# Patient Record
Sex: Female | Born: 1945
Health system: Southern US, Community
[De-identification: ages and names within clinical notes are randomized; demographics above are authoritative.]

## PROBLEM LIST (undated history)

## (undated) DIAGNOSIS — D649 Anemia, unspecified: Secondary | ICD-10-CM

## (undated) DIAGNOSIS — C4492 Squamous cell carcinoma of skin, unspecified: Secondary | ICD-10-CM

## (undated) DIAGNOSIS — M810 Age-related osteoporosis without current pathological fracture: Secondary | ICD-10-CM

## (undated) DIAGNOSIS — Z8489 Family history of other specified conditions: Secondary | ICD-10-CM

## (undated) DIAGNOSIS — R112 Nausea with vomiting, unspecified: Secondary | ICD-10-CM

## (undated) DIAGNOSIS — E119 Type 2 diabetes mellitus without complications: Secondary | ICD-10-CM

## (undated) DIAGNOSIS — E785 Hyperlipidemia, unspecified: Secondary | ICD-10-CM

## (undated) DIAGNOSIS — G459 Transient cerebral ischemic attack, unspecified: Secondary | ICD-10-CM

## (undated) DIAGNOSIS — M1990 Unspecified osteoarthritis, unspecified site: Secondary | ICD-10-CM

## (undated) DIAGNOSIS — G9050 Complex regional pain syndrome I, unspecified: Secondary | ICD-10-CM

## (undated) DIAGNOSIS — C801 Malignant (primary) neoplasm, unspecified: Secondary | ICD-10-CM

## (undated) DIAGNOSIS — Z85828 Personal history of other malignant neoplasm of skin: Secondary | ICD-10-CM

## (undated) DIAGNOSIS — I10 Essential (primary) hypertension: Secondary | ICD-10-CM

## (undated) DIAGNOSIS — Z87442 Personal history of urinary calculi: Secondary | ICD-10-CM

## (undated) DIAGNOSIS — Z9889 Other specified postprocedural states: Secondary | ICD-10-CM

## (undated) HISTORY — PX: MOHS SURGERY: SHX181

## (undated) HISTORY — PX: DILATION AND CURETTAGE OF UTERUS: SHX78

## (undated) HISTORY — PX: SHOULDER ARTHROSCOPY: SHX128

## (undated) HISTORY — PX: KNEE ARTHROSCOPY: SHX127

## (undated) HISTORY — DX: Squamous cell carcinoma of skin, unspecified: C44.92

## (undated) HISTORY — PX: ROTATOR CUFF REPAIR: SHX139

---

## 1992-10-02 DIAGNOSIS — G459 Transient cerebral ischemic attack, unspecified: Secondary | ICD-10-CM

## 1992-10-02 HISTORY — DX: Transient cerebral ischemic attack, unspecified: G45.9

## 1993-10-02 HISTORY — PX: ABDOMINAL HYSTERECTOMY: SHX81

## 1999-05-05 ENCOUNTER — Encounter: Payer: Self-pay | Admitting: Orthopedic Surgery

## 1999-05-09 ENCOUNTER — Ambulatory Visit (HOSPITAL_COMMUNITY): Admission: RE | Admit: 1999-05-09 | Discharge: 1999-05-10 | Payer: Self-pay | Admitting: Orthopedic Surgery

## 1999-07-14 ENCOUNTER — Encounter: Admission: RE | Admit: 1999-07-14 | Discharge: 1999-07-14 | Payer: Self-pay | Admitting: Obstetrics and Gynecology

## 1999-07-20 ENCOUNTER — Encounter: Payer: Self-pay | Admitting: Obstetrics and Gynecology

## 1999-07-20 ENCOUNTER — Encounter: Admission: RE | Admit: 1999-07-20 | Discharge: 1999-07-20 | Payer: Self-pay | Admitting: Obstetrics and Gynecology

## 2000-07-20 ENCOUNTER — Encounter: Payer: Self-pay | Admitting: Obstetrics and Gynecology

## 2000-07-20 ENCOUNTER — Encounter: Admission: RE | Admit: 2000-07-20 | Discharge: 2000-07-20 | Payer: Self-pay | Admitting: Obstetrics and Gynecology

## 2000-07-23 ENCOUNTER — Ambulatory Visit (HOSPITAL_COMMUNITY): Admission: RE | Admit: 2000-07-23 | Discharge: 2000-07-23 | Payer: Self-pay | Admitting: Orthopedic Surgery

## 2001-08-01 ENCOUNTER — Encounter: Admission: RE | Admit: 2001-08-01 | Discharge: 2001-08-01 | Payer: Self-pay | Admitting: Obstetrics and Gynecology

## 2001-08-01 ENCOUNTER — Encounter: Payer: Self-pay | Admitting: Obstetrics and Gynecology

## 2002-05-21 ENCOUNTER — Encounter (INDEPENDENT_AMBULATORY_CARE_PROVIDER_SITE_OTHER): Payer: Self-pay | Admitting: *Deleted

## 2002-05-21 ENCOUNTER — Ambulatory Visit (HOSPITAL_COMMUNITY): Admission: RE | Admit: 2002-05-21 | Discharge: 2002-05-21 | Payer: Self-pay | Admitting: Gastroenterology

## 2002-08-14 ENCOUNTER — Encounter: Admission: RE | Admit: 2002-08-14 | Discharge: 2002-08-14 | Payer: Self-pay | Admitting: Obstetrics and Gynecology

## 2002-08-14 ENCOUNTER — Encounter: Payer: Self-pay | Admitting: Obstetrics and Gynecology

## 2003-10-01 ENCOUNTER — Encounter: Admission: RE | Admit: 2003-10-01 | Discharge: 2003-10-01 | Payer: Self-pay | Admitting: Obstetrics and Gynecology

## 2004-03-11 ENCOUNTER — Ambulatory Visit (HOSPITAL_BASED_OUTPATIENT_CLINIC_OR_DEPARTMENT_OTHER): Admission: RE | Admit: 2004-03-11 | Discharge: 2004-03-11 | Payer: Self-pay | Admitting: Orthopedic Surgery

## 2004-03-11 ENCOUNTER — Ambulatory Visit (HOSPITAL_COMMUNITY): Admission: RE | Admit: 2004-03-11 | Discharge: 2004-03-11 | Payer: Self-pay | Admitting: Orthopedic Surgery

## 2005-03-26 ENCOUNTER — Emergency Department (HOSPITAL_COMMUNITY): Admission: EM | Admit: 2005-03-26 | Discharge: 2005-03-26 | Payer: Self-pay | Admitting: Emergency Medicine

## 2005-04-21 ENCOUNTER — Ambulatory Visit (HOSPITAL_BASED_OUTPATIENT_CLINIC_OR_DEPARTMENT_OTHER): Admission: RE | Admit: 2005-04-21 | Discharge: 2005-04-21 | Payer: Self-pay | Admitting: Orthopedic Surgery

## 2005-04-21 ENCOUNTER — Ambulatory Visit (HOSPITAL_COMMUNITY): Admission: RE | Admit: 2005-04-21 | Discharge: 2005-04-21 | Payer: Self-pay | Admitting: Orthopedic Surgery

## 2005-08-30 ENCOUNTER — Ambulatory Visit: Payer: Self-pay | Admitting: Pain Medicine

## 2005-09-07 ENCOUNTER — Ambulatory Visit: Payer: Self-pay | Admitting: Pain Medicine

## 2005-09-20 ENCOUNTER — Ambulatory Visit: Payer: Self-pay | Admitting: Physician Assistant

## 2007-03-22 ENCOUNTER — Encounter: Admission: RE | Admit: 2007-03-22 | Discharge: 2007-03-22 | Payer: Self-pay | Admitting: Family Medicine

## 2007-11-01 ENCOUNTER — Encounter: Admission: RE | Admit: 2007-11-01 | Discharge: 2007-11-01 | Payer: Self-pay | Admitting: Family Medicine

## 2007-11-17 ENCOUNTER — Emergency Department (HOSPITAL_COMMUNITY): Admission: EM | Admit: 2007-11-17 | Discharge: 2007-11-18 | Payer: Self-pay | Admitting: Emergency Medicine

## 2008-07-15 ENCOUNTER — Encounter: Admission: RE | Admit: 2008-07-15 | Discharge: 2008-07-15 | Payer: Self-pay | Admitting: Family Medicine

## 2009-05-29 ENCOUNTER — Emergency Department (HOSPITAL_COMMUNITY): Admission: EM | Admit: 2009-05-29 | Discharge: 2009-05-29 | Payer: Self-pay | Admitting: Family Medicine

## 2009-09-23 ENCOUNTER — Encounter: Admission: RE | Admit: 2009-09-23 | Discharge: 2009-09-23 | Payer: Self-pay | Admitting: Obstetrics and Gynecology

## 2010-10-12 ENCOUNTER — Encounter
Admission: RE | Admit: 2010-10-12 | Discharge: 2010-10-12 | Payer: Self-pay | Source: Home / Self Care | Attending: Family Medicine | Admitting: Family Medicine

## 2011-02-17 NOTE — Op Note (Signed)
Alexis Hopkins, Alexis Hopkins             ACCOUNT NO.:  [id number]   MEDICAL RECORD NO.:  [id number]          PATIENT TYPE:  AMB   LOCATION:  [facility]                         FACILITY:  [facility]   PHYSICIAN:  Marlowe Kays, M.D.  DATE OF BIRTH:  21-Apr-1946   DATE OF PROCEDURE:  04/21/2005  DATE OF DISCHARGE:                                 OPERATIVE REPORT   PREOPERATIVE DIAGNOSES:  1.  Torn lateral meniscus.  2.  Osteoarthritis left knee.   POSTOPERATIVE DIAGNOSES:  1.  Torn medial and lateral menisci.  2.  Osteoarthritis left knee.   OPERATION:  Left knee arthroscopy with 1) partial medial and lateral  meniscectomies, 2) shaving medial and lateral femoral condyle.   SURGEON:  Marlowe Kays, M.D.   ASSISTANT:  Nurse.   ANESTHESIA:  General.   PATHOLOGY AND JUSTIFICATION FOR PROCEDURE:  She has a history of having had  a left knee arthroscopy by me roughly a year ago. Her knee gave way on her  around June 27 of this year and she was seen by Dr. Darrelyn Hillock who treated her  for a Colles fracture in the left wrist but also sent her for an MRI of her  left knee because of the injury. The MRI was performed on July 8 and  in  addition to osteoarthritis did show a badly torn lateral meniscus and  consequently she is here today for the above mentioned surgery. See  operative description below for additional details.   DESCRIPTION OF PROCEDURE:  Satisfied general anesthesia, pneumatic  tourniquet, leg was Esmarched out nonsterilely, thigh stabilizer applied and  the leg was prepped from stabilizer to toes with DuraPrep, draped in sterile  field. In the right lower extremity, I used an Ace wrap and knee support.  Superior medial saline inflow, first through an anterolateral portal, the  medial compartment of the knee joint was evaluated . Immediately apparent  was a grade 1/2 wear of the medial femoral condyle and some trauma to the  anterior medial meniscus and synovitis. On probing, she had a  bucket-handle  type tear superficially of the anterior third of the medial meniscus. I used  a 4.2 shaver to smooth down the medial femoral condyle and the anterior  medial meniscus until the bucket handle component had been resected. The  remainder of the medial meniscus was normal on probing. Looking up in the  medial gutter and suprapateller area, no arthroscopically treatable areas  were noted. I then reversed portals. She had striking full-thickness  chondromalacia over most of the lateral tibial plateau. There was a remnant  of the lateral meniscus which was basically the anterior third but at its  junction where the middle third used to be there was a disruption there.  Posteriorly she had a severe bucket-handle type tear of the lateral  meniscus. I used arthroscopic scissors to detach the bucket-handle portion  at the posterior lateral curve and then used an accessory portal medially  grasping the cut portion of meniscus through the original anterior and  medial portal and using arthroscopic scissors to cut the intercondylar  attachment and then I  removed the large fragment as a unit enlarging the  medial portal slightly. This measured almost 3 cm and length and picture was  taken with a ruler. I then trimmed down what little was remaining of the  lateral meniscus posteriorly and anteriorly and also smoothed down the  lateral femoral condyle which had almost grade 4 chondromalacia as well. The  knee joint was irrigated until clear and all fluid possible removed. The two  anterior portals closed with 4-0 nylon. 20 mL 0.5% Marcaine with adrenalin,  4 mg of morphine were then introduced through the inflow apparatus which was  removed and this portal closed with 4-0 nylon as well. Betadine Adaptic dry  sterile dressings were applied. Tourniquet was released. She tolerated the  procedure well and was taken to the recovery room in satisfactory condition  with no known  complications.       JA/MEDQ  D:  04/21/2005  T:  04/21/2005  Job:  [id number]

## 2011-02-17 NOTE — Op Note (Signed)
[facility]  Patient:    Alexis Hopkins, Alexis Hopkins                      MRN: [id number] Proc. Date: 07/23/00 Adm. Date:  [id number] Attending:  Marlowe Kays Page                           Operative Report  PREOPERATIVE DIAGNOSIS:  Partial articular surface rotator cuff tear with degenerative changes of labrum, right shoulder status post rotator cuff repair.  POSTOPERATIVE DIAGNOSIS:  Partial articular surface rotator cuff tear with degenerative changes of labrum, right shoulder status post rotator cuff repair.  OPERATION:  Right shoulder arthroscopy with shaving of underneath surface of rotator cuff, debridement of adhesions from around biceps tendon and debridement of labrum and synovitis in shoulder shoulder.  SURGEON:  Illene Labrador. Aplington, M.D.  ASSISTANT:  Della Goo, P.A.  ANESTHESIA:  General.  INDICATION FOR PROCEDURE:  Original surgery was on May 09, 1999.  She has had persistent problems with loss of external rotation and pain radiating down into the biceps muscle area.  An MRI was performed on June 18, 2000 which demonstrated some advanced glenohumeral degenerative disease with some degenerative chondrosis sparing and labrale degenerative changes as well as a partial articular surface rotator cuff tear.  The distal surface of the rotator cuff looked fine.  On my reading there was no residual impingement. Consequently, I felt that the glenohumeral arthroscopy with the procedure noted above would be the treatment of choice here.  DESCRIPTION OF PROCEDURE:  Satisfactory general anesthesia.  Beach chair position on the sline frame.  Right shoulder girdle was prepped with Duraprep and draped in a sterile field.  The shoulder was marked out.  Posterior soft spot was infiltrated with 0.25% Marcaine with adrenalin and the glenohumeral joint entered without much difficulty other than the fact that there was some scar present from  prior surgery.  On viewing the joint, I found one long adhesion paralleling the long head of the biceps tendon, irregularity of the undersurface of the rotator cuff at the level of visible suture from the prior repair.  She also had a good bit of synovitis around the glenoid labrum and glenohumeral joint.  Accordingly, I advanced the scope between the biceps tendon and subscapularis, used a switching stick inserting in the anterior incision. Placed a cannula over the switching stick and entering the shoulder joint anteriorly with a 4.2 shaver.  I then shaved down the glenohumeral joint and the labrum and also shaved the undersurface of the rotator cuff removing the suture and also removing the long adhesive band adjacent to the long head of the biceps tendon.  Also performed a little shaving of the humeral head. When this had been completed, there appeared to be no other abnormalities that needed surgical treatment.  Fluid was evacuated from the shoulder joint.  Both portals were once again infiltrated with 0.25% Marcaine with Adrenalin and closed with 4-0 nylon.  Betadine Adaptic dry sterile dressing and shoulder immobilizer applied.  She tolerated the procedure well.  At the time of this dictation was on her way to recovery room in satisfactory condition with no known complication. DD:  07/23/00 TD:  07/23/00 Job: [id number]

## 2011-02-17 NOTE — Op Note (Signed)
NAME:  Alexis Hopkins, Alexis Hopkins                       ACCOUNT NO.:  [id number]   MEDICAL RECORD NO.:  [id number]                   PATIENT TYPE:  AMB   LOCATION:  [facility]                                 FACILITY:  [facility]   PHYSICIAN:  Marlowe Kays, M.D.               DATE OF BIRTH:  03-04-1946   DATE OF PROCEDURE:  03/11/2004  DATE OF DISCHARGE:                                 OPERATIVE REPORT   PREOPERATIVE DIAGNOSES:  1. Torn lateral meniscus.  2. Medial shelf plica.  3. Degenerative arthritis, left knee.   POSTOPERATIVE DIAGNOSES:  1. Torn lateral meniscus.  2. Medial shelf plica.  3. Degenerative arthritis, left knee.   OPERATION:  Left knee arthroscopy with (1) partial lateral meniscectomy.  (2) debridement of medial femoral condyle and patella.  (3) excision of  large suprapatellar plica.   SURGEON:  Illene Labrador. Aplington, M.D.   ASSISTANT:  Nurse.   ANESTHESIA:  General.   PATHOLOGY AND JUSTIFICATION FOR PROCEDURE:  I previously arthroscoped her  right knee, and she has done well following it.  She has had pain and  swelling in her left knee.  An MRI has demonstrated the above-mentioned  problems.  Accordingly, she is here today for surgical treatment.  See  operative description below for additional details of pathology.   DESCRIPTION OF PROCEDURE:  Satisfactory general anesthesia, pneumatic  tourniquet, thigh stabilizer.  Left knee was prepped and draped in a sterile  field.  It was esmarched out nonsterilely first.  Knee support and Ace wrap  to right leg.  First, superior and medial saline inflow.  First through an  anterolateral portal, the medial compartment of the knee joint was  evaluated.  On entering the joint, she had some shearing type of partial  detachment of several large sections of the articular cartilage.  These were  pictured, and I shaved them off with a 3.5 shaver.  Looking posteriorly, she  had one additional large flap encompassing most of the posterior  medial  femoral condyle as well which I removed with a combination of baskets and  3.5 shaver.  There was some wear of the medial femoral condyle, and neither  side of the joint, however, was a full-thickness wear.  The medial meniscus  was normal on probing.  Then looked up in the medial gutter and  suprapatellar area.  She had some moderate wear of the patella which I  shaved.  The plica, I cut with scissors in several layers.  It went across  the entire width of the suprapatellar pouch.  I then evacuated it with 3.5  shaver.  On reversing portals, there was a little synovitis laterally which  I resected.  She had severe tearing of the entire lateral meniscus which I  debrided back to a stable rim with baskets and shaved down until smooth with  the 3.5 shaver.  In addition, there was a full-thickness chondromalacia of  the posterior lateral tibial plateau.  I looked up along the lateral gutter  and suprapatellar area and found no additional plica remnants that needed to  be removed.  The knee joint was then irrigated until clear and all fluid  possible removed.  The two anterior portals were closed with 4-0 nylon; 20  mL of 0.5% Marcaine with adrenaline and 4 mg  of morphine were then instilled through the inflow apparatus which was  removed and this portal closed with 4-0 nylon as well.  Betadine, Adaptic  dry sterile dressing were applied.  Tourniquet was released.  She tolerated  the procedure well and was taken to the recovery room in satisfactory  condition with no known complications.                                               Marlowe Kays, M.D.    JA/MEDQ  D:  03/11/2004  T:  03/11/2004  Job:  [id number]

## 2011-02-17 NOTE — Op Note (Signed)
   NAMEPORSHE, FLEAGLE                         ACCOUNT NO.:  [id number]   MEDICAL RECORD NO.:  [id number]                   PATIENT TYPE:  AMB   LOCATION:  [facility]                                 FACILITY:  [facility]   PHYSICIAN:  Florencia Reasons, M.D.             DATE OF BIRTH:  Oct 04, 1945   DATE OF PROCEDURE:  05/21/2002  DATE OF DISCHARGE:                                 OPERATIVE REPORT   PROCEDURE:  Colonoscopy with polypectomy.   INDICATIONS FOR PROCEDURE:  65 year old female for colon cancer screening.   FINDINGS:  6 mm polyp in the sigmoid region.   PROCEDURE:  The nature, purpose, and risks of the procedure have been  discussed with the patient who provided written consent.  Sedation was  Fentanyl 75 mcg and Versed 8 mg IV without arrhythmias or desaturations.  The Olympus adjustable tension pediatric video colonoscope was advanced to  the cecum as identified by visualization of the appendiceal orifice, using  some external abdominal compression to control looping.  Pull back was then  performed.  The quality of the prep was excellent and it was felt that all  areas were well seen.  There was some stool film coating part of the  proximal colon but this could readily be rinsed out.  The main finding on  this exam was a 6 mm semipedunculated polyp at 30 cm removed by snare  technique with complete hemostasis and no evidence of excessive cautery.  The polyp fragment was suctioned through the scope and sent for histologic  analysis.  There was mild scattered diverticulosis, more so on the right  side of the colon than the left.  Retroflexion in the rectum and inspection  of the rectosigmoid was unremarkable.  There was no evidence of cancer,  colitis, or vascular malformations.  The patient tolerated the procedure  well and there were no apparent complications.   IMPRESSION:  1. Sigmoid polyp.  2. Diverticulosis.   PLAN:  Await pathology.                 Florencia Reasons, M.D.    RVB/MEDQ  D:  05/21/2002  T:  05/22/2002  Job:  [id number]   cc:   Desma Maxim, M.D.

## 2011-07-25 ENCOUNTER — Ambulatory Visit
Admission: RE | Admit: 2011-07-25 | Discharge: 2011-07-25 | Disposition: A | Discharge status: Home / Self Care | Payer: Managed Care, Other (non HMO) | Source: Ambulatory Visit | Attending: Family Medicine | Admitting: Family Medicine

## 2011-07-25 ENCOUNTER — Other Ambulatory Visit: Payer: Self-pay | Admitting: Family Medicine

## 2011-07-25 DIAGNOSIS — M545 Low back pain, unspecified: Secondary | ICD-10-CM

## 2011-10-26 ENCOUNTER — Other Ambulatory Visit: Payer: Self-pay | Admitting: Family Medicine

## 2011-10-26 DIAGNOSIS — Z1231 Encounter for screening mammogram for malignant neoplasm of breast: Secondary | ICD-10-CM

## 2011-11-20 ENCOUNTER — Ambulatory Visit
Admission: RE | Admit: 2011-11-20 | Discharge: 2011-11-20 | Disposition: A | Discharge status: Home / Self Care | Payer: Managed Care, Other (non HMO) | Source: Ambulatory Visit | Attending: Family Medicine | Admitting: Family Medicine

## 2011-11-20 DIAGNOSIS — Z1231 Encounter for screening mammogram for malignant neoplasm of breast: Secondary | ICD-10-CM

## 2011-11-22 ENCOUNTER — Ambulatory Visit: Payer: Managed Care, Other (non HMO)

## 2012-03-06 ENCOUNTER — Other Ambulatory Visit: Payer: Self-pay | Admitting: Gastroenterology

## 2012-12-11 ENCOUNTER — Other Ambulatory Visit: Payer: Self-pay | Admitting: Family Medicine

## 2012-12-11 ENCOUNTER — Ambulatory Visit
Admission: RE | Admit: 2012-12-11 | Discharge: 2012-12-11 | Disposition: A | Discharge status: Home / Self Care | Payer: Medicare Other | Source: Ambulatory Visit | Attending: Family Medicine | Admitting: Family Medicine

## 2012-12-11 DIAGNOSIS — Z1231 Encounter for screening mammogram for malignant neoplasm of breast: Secondary | ICD-10-CM

## 2012-12-31 ENCOUNTER — Ambulatory Visit
Admission: RE | Admit: 2012-12-31 | Discharge: 2012-12-31 | Disposition: A | Discharge status: Home / Self Care | Payer: Medicare Other | Source: Ambulatory Visit

## 2012-12-31 ENCOUNTER — Other Ambulatory Visit: Payer: Self-pay

## 2012-12-31 ENCOUNTER — Other Ambulatory Visit: Payer: Self-pay | Admitting: Family Medicine

## 2012-12-31 DIAGNOSIS — Z1231 Encounter for screening mammogram for malignant neoplasm of breast: Secondary | ICD-10-CM

## 2012-12-31 DIAGNOSIS — R928 Other abnormal and inconclusive findings on diagnostic imaging of breast: Secondary | ICD-10-CM

## 2013-01-10 ENCOUNTER — Ambulatory Visit
Admission: RE | Admit: 2013-01-10 | Discharge: 2013-01-10 | Disposition: A | Discharge status: Home / Self Care | Payer: Medicare Other | Source: Ambulatory Visit | Attending: Family Medicine | Admitting: Family Medicine

## 2013-01-10 DIAGNOSIS — R928 Other abnormal and inconclusive findings on diagnostic imaging of breast: Secondary | ICD-10-CM

## 2013-05-29 ENCOUNTER — Ambulatory Visit: Payer: Medicare Other | Attending: Specialist | Admitting: Physical Therapy

## 2013-05-29 DIAGNOSIS — R5381 Other malaise: Secondary | ICD-10-CM | POA: Insufficient documentation

## 2013-05-29 DIAGNOSIS — M25619 Stiffness of unspecified shoulder, not elsewhere classified: Secondary | ICD-10-CM | POA: Insufficient documentation

## 2013-05-29 DIAGNOSIS — M25519 Pain in unspecified shoulder: Secondary | ICD-10-CM | POA: Insufficient documentation

## 2013-05-29 DIAGNOSIS — IMO0001 Reserved for inherently not codable concepts without codable children: Secondary | ICD-10-CM | POA: Insufficient documentation

## 2013-06-03 ENCOUNTER — Ambulatory Visit: Payer: Medicare Other | Attending: Specialist | Admitting: *Deleted

## 2013-06-03 DIAGNOSIS — M25519 Pain in unspecified shoulder: Secondary | ICD-10-CM | POA: Insufficient documentation

## 2013-06-03 DIAGNOSIS — R5381 Other malaise: Secondary | ICD-10-CM | POA: Insufficient documentation

## 2013-06-03 DIAGNOSIS — IMO0001 Reserved for inherently not codable concepts without codable children: Secondary | ICD-10-CM | POA: Insufficient documentation

## 2013-06-03 DIAGNOSIS — M25619 Stiffness of unspecified shoulder, not elsewhere classified: Secondary | ICD-10-CM | POA: Insufficient documentation

## 2013-06-05 ENCOUNTER — Ambulatory Visit: Payer: Medicare Other | Admitting: *Deleted

## 2013-06-10 ENCOUNTER — Ambulatory Visit: Payer: Medicare Other | Admitting: Physical Therapy

## 2013-06-12 ENCOUNTER — Ambulatory Visit: Payer: Medicare Other | Admitting: Physical Therapy

## 2013-06-16 ENCOUNTER — Ambulatory Visit: Payer: Medicare Other | Admitting: Physical Therapy

## 2013-06-18 ENCOUNTER — Ambulatory Visit: Payer: Medicare Other | Admitting: Physical Therapy

## 2013-06-24 ENCOUNTER — Encounter: Payer: Medicare Other | Admitting: Physical Therapy

## 2013-06-26 ENCOUNTER — Ambulatory Visit: Payer: Medicare Other | Admitting: Physical Therapy

## 2013-07-10 ENCOUNTER — Other Ambulatory Visit: Payer: Self-pay | Admitting: Orthopedic Surgery

## 2013-07-10 DIAGNOSIS — S42209A Unspecified fracture of upper end of unspecified humerus, initial encounter for closed fracture: Secondary | ICD-10-CM

## 2013-07-14 ENCOUNTER — Ambulatory Visit
Admission: RE | Admit: 2013-07-14 | Discharge: 2013-07-14 | Disposition: A | Discharge status: Home / Self Care | Payer: Medicare Other | Source: Ambulatory Visit | Attending: Orthopedic Surgery | Admitting: Orthopedic Surgery

## 2013-07-14 DIAGNOSIS — S42209A Unspecified fracture of upper end of unspecified humerus, initial encounter for closed fracture: Secondary | ICD-10-CM

## 2013-09-19 ENCOUNTER — Encounter (HOSPITAL_COMMUNITY): Payer: Self-pay | Admitting: Pharmacy Technician

## 2013-09-22 NOTE — Pre-Procedure Instructions (Signed)
AMI MALLY  09/22/2013   Your procedure is scheduled on:  Friday October 03, 2013 @ 7:30 AM.  Report to [facility] Short Stay Entrance "A"  Admitting at 5:30 AM.  Call this number if you have problems the morning of surgery: [phone]   Remember:   Do not eat food or drink liquids after midnight.   Take these medicines the morning of surgery with A SIP OF WATER: Amlodipine (Norvasc)   Do NOT take any diabetic medications the morning of your surgery   Do not wear jewelry, make-up or nail polish.  Do not wear lotions, powders, or perfumes. You may wear deodorant.  Do not shave 48 hours prior to surgery.   Do not bring valuables to the hospital.  [facility] is not responsible for any belongings or valuables.               Contacts, dentures or bridgework may not be worn into surgery.  Leave suitcase in the car. After surgery it may be brought to your room.  For patients admitted to the hospital, discharge time is determined by your treatment team.               Patients discharged the day of surgery will not be allowed to drive home.  Name and phone number of your driver: Family/Friend  Special Instructions: Shower using CHG 2 nights before surgery and the night before surgery.  If you shower the day of surgery use CHG.  Use special wash - you have one bottle of CHG for all showers.  You should use approximately 1/3 of the bottle for each shower.   Please read over the following fact sheets that you were given: Pain Booklet, Coughing and Deep Breathing, Blood Transfusion Information and Surgical Site Infection Prevention

## 2013-09-23 ENCOUNTER — Encounter (HOSPITAL_COMMUNITY): Payer: Self-pay

## 2013-09-23 ENCOUNTER — Encounter (HOSPITAL_COMMUNITY)
Admission: RE | Admit: 2013-09-23 | Discharge: 2013-09-23 | Disposition: A | Discharge status: Home / Self Care | Payer: Medicare Other | Source: Ambulatory Visit | Attending: Anesthesiology | Admitting: Anesthesiology

## 2013-09-23 ENCOUNTER — Encounter (HOSPITAL_COMMUNITY)
Admission: RE | Admit: 2013-09-23 | Discharge: 2013-09-23 | Disposition: A | Discharge status: Home / Self Care | Payer: Medicare Other | Source: Ambulatory Visit | Attending: Orthopedic Surgery | Admitting: Orthopedic Surgery

## 2013-09-23 DIAGNOSIS — Z01818 Encounter for other preprocedural examination: Secondary | ICD-10-CM | POA: Insufficient documentation

## 2013-09-23 DIAGNOSIS — Z01812 Encounter for preprocedural laboratory examination: Secondary | ICD-10-CM | POA: Insufficient documentation

## 2013-09-23 HISTORY — DX: Transient cerebral ischemic attack, unspecified: G45.9

## 2013-09-23 HISTORY — DX: Hyperlipidemia, unspecified: E78.5

## 2013-09-23 HISTORY — DX: Type 2 diabetes mellitus without complications: E11.9

## 2013-09-23 HISTORY — DX: Unspecified osteoarthritis, unspecified site: M19.90

## 2013-09-23 HISTORY — DX: Personal history of urinary calculi: Z87.442

## 2013-09-23 HISTORY — DX: Essential (primary) hypertension: I10

## 2013-09-23 LAB — BASIC METABOLIC PANEL
CO2: 22 mEq/L (ref 19–32)
GFR calc Af Amer: 68 mL/min — ABNORMAL LOW (ref >90)
GFR calc non Af Amer: 59 mL/min — ABNORMAL LOW (ref >90)
Potassium: 4.1 mEq/L (ref 3.5–5.1)
Sodium: 142 mEq/L (ref 135–145)

## 2013-09-23 LAB — TYPE AND SCREEN
ABO/RH(D): AB POS
Antibody Screen: NEGATIVE

## 2013-09-23 LAB — CBC
Hemoglobin: 12.4 g/dL (ref 12.0–15.0)
MCV: 90.2 fL (ref 78.0–100.0)
RBC: 4.2 MIL/uL (ref 3.87–5.11)

## 2013-09-23 LAB — ABO/RH: ABO/RH(D): AB POS

## 2013-09-23 NOTE — Progress Notes (Addendum)
Patient informed Nurse that she had a stress test > 10 years ago but denied having a cardiac cath or sleep study. PCP is Dr. Lupita Raider and a EKG will be requested from her office. Nurse informed patient if a EKG was not sent then it would be performed the DOS. Patient verbalized understanding. During PAT visit patient informed Nurse that she was instructed to stop Plavix and her vitamins this coming up Sunday 09/27/13.

## 2013-09-24 NOTE — Progress Notes (Signed)
Anesthesia chart review:  Patient is a 67 year old female scheduled for right reverse total shoulder arthroplasty on 10/03/13 by Dr. Ranell Patrick.  History includes HTN, DM2, HLD, non-smoker, TIA '94, arthritis, nephrolithiasis, PNA, hysterectomy.  PCP is Dr. Lupita Raider who cleared patient for this procedure with instructions to "be careful of her nephropathy (mild) and h/o CVA."  EKG on 12/16/12 (Dr. Clelia Croft) showed NSR.  CXR on 09/23/13 showed no active cardiopulmonary disease.  Preoperative labs noted.  BUN/Cr 22/0.98.  Glucose was 57. She will get a fasting CBG on arrival. She is scheduled to be a first case.  If no acute changes then I would anticipate that she could proceed as planned.  Velna Ochs [facility] Short Stay Center/Anesthesiology Phone [phone] 09/24/2013 2:12 PM

## 2013-09-30 NOTE — H&P (Signed)
Alexis Hopkins is an 67 y.o. female.    Chief Complaint: right shoulder pain  HPI: Pt is a 67 y.o. female complaining of right shoulder pain for multiple years. Pain had continually increased since the beginning. X-rays in the clinic show end-stage arthritic changes of the right shoulder. Pt has tried various conservative treatments which have failed to alleviate their symptoms, including injections and therapy. Various options are discussed with the patient. Risks, benefits and expectations were discussed with the patient. Patient understand the risks, benefits and expectations and wishes to proceed with surgery.   PCP:  Lupita Raider, MD  D/C Plans:  Home with HHPT  PMH: Past Medical History  Diagnosis Date  . Hypertension   . Diabetes mellitus without complication   . Pneumonia     hx of  . TIA (transient ischemic attack) 1994    Takes Plavix  . History of kidney stones   . Hyperlipemia   . Arthritis     PSH: Past Surgical History  Procedure Laterality Date  . Rotator cuff repair Right   . Shoulder arthroscopy Right   . Knee arthroscopy      X 1 on right; X 2 on left  . Abdominal hysterectomy  1995    Social History:  reports that she has never smoked. She does not have any smokeless tobacco history on file. She reports that she does not drink alcohol or use illicit drugs.  Allergies:  Allergies  Allergen Reactions  . Shellfish Allergy Hives    Medications: No current facility-administered medications for this encounter.   Current Outpatient Prescriptions  Medication Sig Dispense Refill  . amLODipine (NORVASC) 10 MG tablet Take 10 mg by mouth daily.      . calcium-vitamin D (OSCAL WITH D) 500-200 MG-UNIT per tablet Take 1 tablet by mouth 2 (two) times daily.      . clopidogrel (PLAVIX) 75 MG tablet Take 75 mg by mouth daily with breakfast.      . colesevelam (WELCHOL) 625 MG tablet Take 1,875 mg by mouth 2 (two) times daily with a meal.      . glyBURIDE  micronized (GLYNASE) 3 MG tablet Take 3 mg by mouth daily with breakfast.      . hydrochlorothiazide (HYDRODIURIL) 25 MG tablet Take 25 mg by mouth daily.      Marland Kitchen lisinopril (PRINIVIL,ZESTRIL) 40 MG tablet Take 40 mg by mouth daily.      . metFORMIN (GLUCOPHAGE) 1000 MG tablet Take 1,000 mg by mouth 2 (two) times daily with a meal.      . Misc Natural Products (OSTEO BI-FLEX TRIPLE STRENGTH PO) Take 1 tablet by mouth 2 (two) times daily.      . Multiple Vitamin (MULTIVITAMIN WITH MINERALS) TABS tablet Take 1 tablet by mouth daily.      . pioglitazone (ACTOS) 30 MG tablet Take 30 mg by mouth daily.      . rosuvastatin (CRESTOR) 10 MG tablet Take 10 mg by mouth daily.        No results found for this or any previous visit (from the past 48 hour(s)). No results found.  ROS: Pain with rom of the right upper extremity  Physical Exam: Alert and oriented 67 y.o. female in no acute distress Cranial nerves 2-12 intact Cervical spine: full rom with no tenderness, nv intact distally Chest: active breath sounds bilaterally, no wheeze rhonchi or rales Heart: regular rate and rhythm, no murmur Abd: non tender non distended with active bowel sounds Hip  is stable with rom  Right upper extremity with moderate pain and weakness with ER and IR nv intact distally No rashes or edema  Assessment/Plan Assessment: right rotator cuff insufficiency  Plan: Patient will undergo a right rotator cuff insufficiency by Dr. Ranell Patrick at [facility]. Risks benefits and expectations were discussed with the patient. Patient understand risks, benefits and expectations and wishes to proceed.

## 2013-10-02 MED ORDER — CEFAZOLIN SODIUM-DEXTROSE 2-3 GM-% IV SOLR
2.0000 g | INTRAVENOUS | Status: AC
Start: 2013-10-03 — End: 2013-10-04
  Filled 2013-10-02 (×2): qty 50

## 2013-10-02 MED ORDER — DEXTROSE 5 % IV SOLN
3.0000 g | INTRAVENOUS | Status: DC
  Filled 2013-10-02: qty 3000

## 2013-10-03 ENCOUNTER — Encounter (HOSPITAL_COMMUNITY)
Admission: RE | Disposition: A | Discharge status: Home / Self Care | Payer: Self-pay | Source: Ambulatory Visit | Attending: Orthopedic Surgery

## 2013-10-03 ENCOUNTER — Encounter (HOSPITAL_COMMUNITY): Payer: Self-pay | Admitting: *Deleted

## 2013-10-03 ENCOUNTER — Inpatient Hospital Stay (HOSPITAL_COMMUNITY)
Admission: RE | Admit: 2013-10-03 | Discharge: 2013-10-05 | DRG: 483 | Disposition: A | Discharge status: Home / Self Care | Payer: Medicare Other | Source: Ambulatory Visit | Attending: Orthopedic Surgery | Admitting: Orthopedic Surgery

## 2013-10-03 ENCOUNTER — Inpatient Hospital Stay (HOSPITAL_COMMUNITY): Payer: Medicare Other | Admitting: Anesthesiology

## 2013-10-03 ENCOUNTER — Encounter (HOSPITAL_COMMUNITY): Payer: Medicare Other | Admitting: Anesthesiology

## 2013-10-03 ENCOUNTER — Inpatient Hospital Stay (HOSPITAL_COMMUNITY): Payer: Medicare Other

## 2013-10-03 DIAGNOSIS — E785 Hyperlipidemia, unspecified: Secondary | ICD-10-CM | POA: Diagnosis present

## 2013-10-03 DIAGNOSIS — Z87442 Personal history of urinary calculi: Secondary | ICD-10-CM

## 2013-10-03 DIAGNOSIS — Z7902 Long term (current) use of antithrombotics/antiplatelets: Secondary | ICD-10-CM

## 2013-10-03 DIAGNOSIS — Z79899 Other long term (current) drug therapy: Secondary | ICD-10-CM

## 2013-10-03 DIAGNOSIS — M19011 Primary osteoarthritis, right shoulder: Secondary | ICD-10-CM | POA: Diagnosis present

## 2013-10-03 DIAGNOSIS — Z8673 Personal history of transient ischemic attack (TIA), and cerebral infarction without residual deficits: Secondary | ICD-10-CM

## 2013-10-03 DIAGNOSIS — M19019 Primary osteoarthritis, unspecified shoulder: Principal | ICD-10-CM | POA: Diagnosis present

## 2013-10-03 DIAGNOSIS — E119 Type 2 diabetes mellitus without complications: Secondary | ICD-10-CM | POA: Diagnosis present

## 2013-10-03 DIAGNOSIS — Z91013 Allergy to seafood: Secondary | ICD-10-CM

## 2013-10-03 DIAGNOSIS — I10 Essential (primary) hypertension: Secondary | ICD-10-CM | POA: Diagnosis present

## 2013-10-03 HISTORY — PX: REVERSE SHOULDER ARTHROPLASTY: SHX5054

## 2013-10-03 LAB — GLUCOSE, CAPILLARY
GLUCOSE-CAPILLARY: 100 mg/dL — AB (ref 70–99)
GLUCOSE-CAPILLARY: 170 mg/dL — AB (ref 70–99)
GLUCOSE-CAPILLARY: 281 mg/dL — AB (ref 70–99)
Glucose-Capillary: 133 mg/dL — ABNORMAL HIGH (ref 70–99)
Glucose-Capillary: 136 mg/dL — ABNORMAL HIGH (ref 70–99)
Glucose-Capillary: 104 mg/dL — ABNORMAL HIGH (ref 70–99)

## 2013-10-03 SURGERY — ARTHROPLASTY, SHOULDER, TOTAL, REVERSE
Anesthesia: General | Site: Shoulder | Laterality: Right

## 2013-10-03 MED ORDER — AMLODIPINE BESYLATE 10 MG PO TABS
10.0000 mg | ORAL_TABLET | Freq: Every day | ORAL | Status: DC
  Administered 2013-10-04: 10 mg via ORAL
  Filled 2013-10-03 (×2): qty 1

## 2013-10-03 MED ORDER — PHENOL 1.4 % MT LIQD: 1.0000 | OROMUCOSAL | Status: DC | PRN

## 2013-10-03 MED ORDER — GLYCOPYRROLATE 0.2 MG/ML IJ SOLN
INTRAMUSCULAR | Status: DC | PRN
  Administered 2013-10-03: .6 mg via INTRAVENOUS

## 2013-10-03 MED ORDER — CEFAZOLIN SODIUM-DEXTROSE 2-3 GM-% IV SOLR
2.0000 g | Freq: Four times a day (QID) | INTRAVENOUS | Status: AC
  Administered 2013-10-03 – 2013-10-04 (×3): 2 g via INTRAVENOUS
  Filled 2013-10-03 (×4): qty 50

## 2013-10-03 MED ORDER — BISACODYL 10 MG RE SUPP: 10.0000 mg | Freq: Every day | RECTAL | Status: DC | PRN

## 2013-10-03 MED ORDER — LACTATED RINGERS IV SOLN
INTRAVENOUS | Status: DC | PRN
  Administered 2013-10-03 (×2): via INTRAVENOUS

## 2013-10-03 MED ORDER — ADULT MULTIVITAMIN W/MINERALS CH
1.0000 | ORAL_TABLET | Freq: Every day | ORAL | Status: DC
  Administered 2013-10-03 – 2013-10-04 (×2): 1 via ORAL
  Filled 2013-10-03 (×3): qty 1

## 2013-10-03 MED ORDER — METHOCARBAMOL 100 MG/ML IJ SOLN
500.0000 mg | Freq: Four times a day (QID) | INTRAVENOUS | Status: DC | PRN
  Filled 2013-10-03: qty 5

## 2013-10-03 MED ORDER — COLESEVELAM HCL 625 MG PO TABS
1875.0000 mg | ORAL_TABLET | Freq: Two times a day (BID) | ORAL | Status: DC
  Administered 2013-10-03 – 2013-10-05 (×4): 1875 mg via ORAL
  Filled 2013-10-03 (×6): qty 3

## 2013-10-03 MED ORDER — METFORMIN HCL 500 MG PO TABS
1000.0000 mg | ORAL_TABLET | Freq: Two times a day (BID) | ORAL | Status: DC
Start: 2013-10-03 — End: 2013-10-03

## 2013-10-03 MED ORDER — INSULIN ASPART 100 UNIT/ML ~~LOC~~ SOLN
0.0000 [IU] | Freq: Three times a day (TID) | SUBCUTANEOUS | Status: DC
  Administered 2013-10-03: 4 [IU] via SUBCUTANEOUS
  Administered 2013-10-03: 11 [IU] via SUBCUTANEOUS
  Administered 2013-10-04: 4 [IU] via SUBCUTANEOUS
  Administered 2013-10-04: 3 [IU] via SUBCUTANEOUS

## 2013-10-03 MED ORDER — CHLORHEXIDINE GLUCONATE 4 % EX LIQD: 60.0000 mL | Freq: Once | CUTANEOUS | Status: DC

## 2013-10-03 MED ORDER — NEOSTIGMINE METHYLSULFATE 1 MG/ML IJ SOLN
INTRAMUSCULAR | Status: DC | PRN
  Administered 2013-10-03: 4 mg via INTRAVENOUS

## 2013-10-03 MED ORDER — GLYBURIDE MICRONIZED 3 MG PO TABS
3.0000 mg | ORAL_TABLET | Freq: Every day | ORAL | Status: DC
  Administered 2013-10-04 – 2013-10-05 (×2): 3 mg via ORAL
  Filled 2013-10-03 (×3): qty 1

## 2013-10-03 MED ORDER — ONDANSETRON HCL 4 MG PO TABS
4.0000 mg | ORAL_TABLET | Freq: Four times a day (QID) | ORAL | Status: DC | PRN
  Administered 2013-10-04: 4 mg via ORAL
  Filled 2013-10-03: qty 1

## 2013-10-03 MED ORDER — PIOGLITAZONE HCL 30 MG PO TABS
30.0000 mg | ORAL_TABLET | Freq: Every day | ORAL | Status: DC
Start: 2013-10-03 — End: 2013-10-05
  Administered 2013-10-03 – 2013-10-04 (×2): 30 mg via ORAL
  Filled 2013-10-03 (×3): qty 1

## 2013-10-03 MED ORDER — INSULIN ASPART 100 UNIT/ML ~~LOC~~ SOLN
6.0000 [IU] | Freq: Three times a day (TID) | SUBCUTANEOUS | Status: DC
  Administered 2013-10-03 – 2013-10-04 (×2): 6 [IU] via SUBCUTANEOUS

## 2013-10-03 MED ORDER — ATORVASTATIN CALCIUM 20 MG PO TABS
20.0000 mg | ORAL_TABLET | Freq: Every day | ORAL | Status: DC
Start: 2013-10-03 — End: 2013-10-05
  Administered 2013-10-03 – 2013-10-04 (×2): 20 mg via ORAL
  Filled 2013-10-03 (×3): qty 1

## 2013-10-03 MED ORDER — HYDROMORPHONE HCL PF 1 MG/ML IJ SOLN: 0.2500 mg | INTRAMUSCULAR | Status: DC | PRN

## 2013-10-03 MED ORDER — METFORMIN HCL 500 MG PO TABS
1000.0000 mg | ORAL_TABLET | Freq: Two times a day (BID) | ORAL | Status: DC
  Administered 2013-10-03 – 2013-10-05 (×4): 1000 mg via ORAL
  Filled 2013-10-03 (×6): qty 2

## 2013-10-03 MED ORDER — LIDOCAINE HCL (CARDIAC) 20 MG/ML IV SOLN
INTRAVENOUS | Status: DC | PRN
  Administered 2013-10-03: 100 mg via INTRAVENOUS

## 2013-10-03 MED ORDER — SODIUM CHLORIDE 0.9 % IV SOLN
INTRAVENOUS | Status: DC
  Administered 2013-10-03: 12:00:00 via INTRAVENOUS

## 2013-10-03 MED ORDER — OXYCODONE-ACETAMINOPHEN 5-325 MG PO TABS
1.0000 | ORAL_TABLET | ORAL | Status: DC | PRN
  Administered 2013-10-03: 1 via ORAL
  Administered 2013-10-04: 2 via ORAL
  Administered 2013-10-04 (×3): 1 via ORAL
  Filled 2013-10-03: qty 2
  Filled 2013-10-03: qty 1
  Filled 2013-10-03: qty 2
  Filled 2013-10-03: qty 1
  Filled 2013-10-03: qty 2

## 2013-10-03 MED ORDER — INSULIN ASPART 100 UNIT/ML ~~LOC~~ SOLN: 0.0000 [IU] | Freq: Every day | SUBCUTANEOUS | Status: DC

## 2013-10-03 MED ORDER — HYDROCHLOROTHIAZIDE 25 MG PO TABS
25.0000 mg | ORAL_TABLET | Freq: Every day | ORAL | Status: DC
  Administered 2013-10-04: 25 mg via ORAL
  Filled 2013-10-03 (×3): qty 1

## 2013-10-03 MED ORDER — CEFAZOLIN SODIUM-DEXTROSE 2-3 GM-% IV SOLR
2.0000 g | INTRAVENOUS | Status: AC
  Administered 2013-10-03: 2 g via INTRAVENOUS

## 2013-10-03 MED ORDER — BUPIVACAINE-EPINEPHRINE (PF) 0.25% -1:200000 IJ SOLN
INTRAMUSCULAR | Status: AC
  Filled 2013-10-03: qty 30

## 2013-10-03 MED ORDER — BUPIVACAINE-EPINEPHRINE PF 0.5-1:200000 % IJ SOLN
INTRAMUSCULAR | Status: DC | PRN
  Administered 2013-10-03: 30 mL via PERINEURAL

## 2013-10-03 MED ORDER — ONDANSETRON HCL 4 MG/2ML IJ SOLN
4.0000 mg | Freq: Four times a day (QID) | INTRAMUSCULAR | Status: DC | PRN
  Administered 2013-10-03: 4 mg via INTRAVENOUS
  Filled 2013-10-03: qty 2

## 2013-10-03 MED ORDER — METHOCARBAMOL 500 MG PO TABS
500.0000 mg | ORAL_TABLET | Freq: Four times a day (QID) | ORAL | Status: DC | PRN
  Administered 2013-10-04: 500 mg via ORAL
  Filled 2013-10-03: qty 1

## 2013-10-03 MED ORDER — PHENYLEPHRINE HCL 10 MG/ML IJ SOLN
INTRAMUSCULAR | Status: DC | PRN
  Administered 2013-10-03: 40 ug via INTRAVENOUS

## 2013-10-03 MED ORDER — METOCLOPRAMIDE HCL 10 MG PO TABS
5.0000 mg | ORAL_TABLET | Freq: Three times a day (TID) | ORAL | Status: DC | PRN
  Administered 2013-10-05: 10 mg via ORAL
  Filled 2013-10-03: qty 1

## 2013-10-03 MED ORDER — ARTIFICIAL TEARS OP OINT
TOPICAL_OINTMENT | OPHTHALMIC | Status: DC | PRN
  Administered 2013-10-03: 1 via OPHTHALMIC

## 2013-10-03 MED ORDER — ONDANSETRON HCL 4 MG/2ML IJ SOLN: 4.0000 mg | Freq: Once | INTRAMUSCULAR | Status: DC | PRN

## 2013-10-03 MED ORDER — SODIUM CHLORIDE 0.9 % IR SOLN
Status: DC | PRN
  Administered 2013-10-03: 1000 mL

## 2013-10-03 MED ORDER — OXYCODONE HCL 5 MG PO TABS: 5.0000 mg | ORAL_TABLET | Freq: Once | ORAL | Status: DC | PRN

## 2013-10-03 MED ORDER — CLOPIDOGREL BISULFATE 75 MG PO TABS
75.0000 mg | ORAL_TABLET | Freq: Every day | ORAL | Status: DC
  Administered 2013-10-04 – 2013-10-05 (×2): 75 mg via ORAL
  Filled 2013-10-03 (×3): qty 1

## 2013-10-03 MED ORDER — OXYCODONE HCL 5 MG/5ML PO SOLN: 5.0000 mg | Freq: Once | ORAL | Status: DC | PRN

## 2013-10-03 MED ORDER — MENTHOL 3 MG MT LOZG
1.0000 | LOZENGE | OROMUCOSAL | Status: DC | PRN
  Filled 2013-10-03: qty 9

## 2013-10-03 MED ORDER — CALCIUM CARBONATE-VITAMIN D 500-200 MG-UNIT PO TABS
1.0000 | ORAL_TABLET | Freq: Two times a day (BID) | ORAL | Status: DC
  Administered 2013-10-03 – 2013-10-04 (×4): 1 via ORAL
  Filled 2013-10-03 (×6): qty 1

## 2013-10-03 MED ORDER — BUPIVACAINE-EPINEPHRINE 0.25% -1:200000 IJ SOLN
INTRAMUSCULAR | Status: DC | PRN
  Administered 2013-10-03: 9 mL

## 2013-10-03 MED ORDER — ONDANSETRON HCL 4 MG/2ML IJ SOLN
INTRAMUSCULAR | Status: DC | PRN
  Administered 2013-10-03: 4 mg via INTRAVENOUS

## 2013-10-03 MED ORDER — LISINOPRIL 40 MG PO TABS
40.0000 mg | ORAL_TABLET | Freq: Every day | ORAL | Status: DC
  Administered 2013-10-04: 40 mg via ORAL
  Filled 2013-10-03 (×3): qty 1

## 2013-10-03 MED ORDER — FENTANYL CITRATE 0.05 MG/ML IJ SOLN
INTRAMUSCULAR | Status: DC | PRN
  Administered 2013-10-03 (×5): 50 ug via INTRAVENOUS

## 2013-10-03 MED ORDER — PHENYLEPHRINE HCL 10 MG/ML IJ SOLN
10.0000 mg | INTRAVENOUS | Status: DC | PRN
  Administered 2013-10-03: 50 ug/min via INTRAVENOUS

## 2013-10-03 MED ORDER — MEPERIDINE HCL 25 MG/ML IJ SOLN: 6.2500 mg | INTRAMUSCULAR | Status: DC | PRN

## 2013-10-03 MED ORDER — METOCLOPRAMIDE HCL 5 MG/ML IJ SOLN: 5.0000 mg | Freq: Three times a day (TID) | INTRAMUSCULAR | Status: DC | PRN

## 2013-10-03 MED ORDER — HYDROMORPHONE HCL PF 1 MG/ML IJ SOLN
0.5000 mg | INTRAMUSCULAR | Status: DC | PRN
  Administered 2013-10-03 (×2): 1 mg via INTRAVENOUS
  Filled 2013-10-03 (×2): qty 1

## 2013-10-03 MED ORDER — PROPOFOL 10 MG/ML IV BOLUS
INTRAVENOUS | Status: DC | PRN
  Administered 2013-10-03: 100 mg via INTRAVENOUS

## 2013-10-03 MED ORDER — ACETAMINOPHEN 650 MG RE SUPP: 650.0000 mg | Freq: Four times a day (QID) | RECTAL | Status: DC | PRN

## 2013-10-03 MED ORDER — ACETAMINOPHEN 325 MG PO TABS: 650.0000 mg | ORAL_TABLET | Freq: Four times a day (QID) | ORAL | Status: DC | PRN

## 2013-10-03 MED ORDER — ROCURONIUM BROMIDE 100 MG/10ML IV SOLN
INTRAVENOUS | Status: DC | PRN
  Administered 2013-10-03: 50 mg via INTRAVENOUS

## 2013-10-03 MED ORDER — PHENYLEPHRINE HCL 10 MG/ML IJ SOLN: 10.0000 mg | INTRAVENOUS | Status: DC | PRN

## 2013-10-03 MED ORDER — MIDAZOLAM HCL 5 MG/5ML IJ SOLN
INTRAMUSCULAR | Status: DC | PRN
  Administered 2013-10-03: 2 mg via INTRAVENOUS

## 2013-10-03 SURGICAL SUPPLY — 59 items
BLADE SAG 18X100X1.27 (BLADE) ×2 IMPLANT
CAPT SHOULD DELTAXTEND CEM MOD ×1 IMPLANT
CLOTH BEACON ORANGE TIMEOUT ST (SAFETY) ×2 IMPLANT
COVER SURGICAL LIGHT HANDLE (MISCELLANEOUS) ×2 IMPLANT
DRAPE INCISE IOBAN 66X45 STRL (DRAPES) ×6 IMPLANT
DRAPE U-SHAPE 47X51 STRL (DRAPES) ×2 IMPLANT
DRAPE X-RAY CASS 24X20 (DRAPES) IMPLANT
DRSG ADAPTIC 3X8 NADH LF (GAUZE/BANDAGES/DRESSINGS) ×2 IMPLANT
DRSG PAD ABDOMINAL 8X10 ST (GAUZE/BANDAGES/DRESSINGS) ×2 IMPLANT
DURAPREP 26ML APPLICATOR (WOUND CARE) ×2 IMPLANT
ELECT BLADE 4.0 EZ CLEAN MEGAD (MISCELLANEOUS) ×2
ELECT NDL TIP 2.8 STRL (NEEDLE) ×1 IMPLANT
ELECT NEEDLE TIP 2.8 STRL (NEEDLE) ×2 IMPLANT
ELECT REM PT RETURN 9FT ADLT (ELECTROSURGICAL) ×2
ELECTRODE BLDE 4.0 EZ CLN MEGD (MISCELLANEOUS) ×1 IMPLANT
ELECTRODE REM PT RTRN 9FT ADLT (ELECTROSURGICAL) ×1 IMPLANT
GLOVE BIOGEL PI ORTHO PRO 7.5 (GLOVE) ×1
GLOVE BIOGEL PI ORTHO PRO SZ8 (GLOVE) ×1
GLOVE ORTHO TXT STRL SZ7.5 (GLOVE) ×2 IMPLANT
GLOVE PI ORTHO PRO STRL 7.5 (GLOVE) ×1 IMPLANT
GLOVE PI ORTHO PRO STRL SZ8 (GLOVE) ×1 IMPLANT
GLOVE SURG ORTHO 8.5 STRL (GLOVE) ×2 IMPLANT
GOWN STRL NON-REIN LRG LVL3 (GOWN DISPOSABLE) ×2 IMPLANT
GOWN STRL REIN XL XLG (GOWN DISPOSABLE) ×4 IMPLANT
HANDPIECE INTERPULSE COAX TIP (DISPOSABLE)
KIT BASIN OR (CUSTOM PROCEDURE TRAY) ×2 IMPLANT
KIT ROOM TURNOVER OR (KITS) ×2 IMPLANT
MANIFOLD NEPTUNE II (INSTRUMENTS) ×2 IMPLANT
NDL 1/2 CIR MAYO (NEEDLE) ×1 IMPLANT
NDL HYPO 25GX1X1/2 BEV (NEEDLE) ×1 IMPLANT
NEEDLE 1/2 CIR MAYO (NEEDLE) ×2 IMPLANT
NEEDLE HYPO 25GX1X1/2 BEV (NEEDLE) ×2 IMPLANT
NS IRRIG 1000ML POUR BTL (IV SOLUTION) ×2 IMPLANT
PACK SHOULDER (CUSTOM PROCEDURE TRAY) ×2 IMPLANT
PAD ABD 8X10 STRL (GAUZE/BANDAGES/DRESSINGS) ×1 IMPLANT
PAD ARMBOARD 7.5X6 YLW CONV (MISCELLANEOUS) ×4 IMPLANT
PIN METAGLENE 2.5 (PIN) IMPLANT
SET HNDPC FAN SPRY TIP SCT (DISPOSABLE) IMPLANT
SLING ARM FOAM STRAP LRG (SOFTGOODS) IMPLANT
SLING ARM FOAM STRAP MED (SOFTGOODS) ×1 IMPLANT
SPONGE GAUZE 4X4 12PLY (GAUZE/BANDAGES/DRESSINGS) ×2 IMPLANT
SPONGE LAP 18X18 X RAY DECT (DISPOSABLE) ×2 IMPLANT
SPONGE LAP 4X18 X RAY DECT (DISPOSABLE) ×2 IMPLANT
STRIP CLOSURE SKIN 1/2X4 (GAUZE/BANDAGES/DRESSINGS) ×2 IMPLANT
SUCTION FRAZIER TIP 10 FR DISP (SUCTIONS) ×2 IMPLANT
SUT FIBERWIRE #2 38 T-5 BLUE (SUTURE) ×4
SUT MNCRL AB 4-0 PS2 18 (SUTURE) ×2 IMPLANT
SUT VIC AB 2-0 CT1 27 (SUTURE) ×2
SUT VIC AB 2-0 CT1 TAPERPNT 27 (SUTURE) ×1 IMPLANT
SUT VICRYL 0 CT 1 36IN (SUTURE) ×2 IMPLANT
SUTURE FIBERWR #2 38 T-5 BLUE (SUTURE) ×2 IMPLANT
SYR CONTROL 10ML LL (SYRINGE) ×2 IMPLANT
TAPE CLOTH SURG 4X10 WHT LF (GAUZE/BANDAGES/DRESSINGS) ×1 IMPLANT
TOWEL OR 17X24 6PK STRL BLUE (TOWEL DISPOSABLE) ×2 IMPLANT
TOWEL OR 17X26 10 PK STRL BLUE (TOWEL DISPOSABLE) ×2 IMPLANT
TOWER CARTRIDGE SMART MIX (DISPOSABLE) IMPLANT
TRAY FOLEY CATH 16FRSI W/METER (SET/KITS/TRAYS/PACK) ×2 IMPLANT
WATER STERILE IRR 1000ML POUR (IV SOLUTION) ×2 IMPLANT
YANKAUER SUCT BULB TIP NO VENT (SUCTIONS) IMPLANT

## 2013-10-03 NOTE — Op Note (Signed)
Alexis Hopkins, Alexis Hopkins             ACCOUNT NO.:  [id number]  MEDICAL RECORD NO.:  [id number]  LOCATION:  5N02C                        FACILITY:  [facility]  PHYSICIAN:  Doran Heater. Veverly Fells, M.D. DATE OF BIRTH:  [birth year]  DATE OF PROCEDURE: DATE OF DISCHARGE:                              OPERATIVE REPORT   PREOPERATIVE DIAGNOSIS:  Right shoulder end-stage arthritis with rotator cuff insufficiency.  POSTOPERATIVE DIAGNOSIS:  Right shoulder end-stage arthritis with rotator cuff insufficiency.  PROCEDURE PERFORMED:  Right shoulder reverse total shoulder arthroplasty using DePuy Delta Xtend prosthesis.  ATTENDING SURGEON:  Doran Heater. Veverly Fells, MD.  ASSISTANT:  Abbott Pao. Dixon, P.A. who scrubbed for the entire procedure and necessary for satisfactory completion of surgery.  ANESTHESIA:  General anesthesia was used plus interscalene block.  ESTIMATED BLOOD LOSS:  Minimal.  FLUID REPLACEMENT:  1500 mL of crystalloid.  INSTRUMENT COUNTS:  Correct.  COMPLICATIONS:  There were no complications.  ANTIBIOTICS:  Perioperative antibiotics were given.  INDICATIONS:  The patient is a 68 year old female with history of worsening right shoulder pain secondary to end-stage arthritis and rotator cuff insufficiency.  The patient had prior rotator cuff surgery back in 2006.  She has had progressive pain and loss of function since that time.  The patient now presents with x-ray evidence of bone-on-bone arthritis, large osteophytes, very little shoulder function at all and rotator cuff insufficiency both clinically and radiographically.  The patient presents for a reverse total shoulder arthroplasty.  Informed consent obtained.  DESCRIPTION OF PROCEDURE:  After adequate level of anesthesia achieved, the patient was positioned in the modified beach-chair position.  Right shoulder was examined under anesthesia.  The patient had extreme stiffness with maybe a 20 degree arc ventral modification, may be  a third degree arc for forward elevation, extremely stiff.  We then went and sterilely prepped and draped the right shoulder and arm.  Time-out was called.  We entered the shoulder using deltopectoral incision started at the coracoid process extending down to the anterior humerus. Dissection down through subcutaneous tissues.  The deltoid was identified and cephalic vein was taken laterally.  The pectoralis taken medially.  We identified the conjoined tendon, retracted that medially. Scar tissue was noted throughout the entire subdeltoid plane, also the subscapularis was scarred.  We released the subscapularis which was basically a thin wisp, the subscap off the lesser tuberosity and then released the inferior capsule and also the remaining scarred but intact rotator cuff.  It was all completely scarred and non mobile at all. Large osteophytes encountered.  We went ahead and delivered the humeral head out of the wound and then used the intramedullary resection guide. We reamed up to a size 12 and then resected for the Delta Xtend prosthesis based on an intramedullary guide, set on 10 degrees retroversion.  We then did our metaphyseal preparation after removing all large bone spurs from the humeral side and we prepared the metaphysis with a size 12 body size 1 right metaphysis.  Once we had that prep done, we removed the trial component.  We then subluxed the humerus posteriorly with great difficulty, did a capsular release 360 degrees, protecting the axillary nerve during this portion of procedure. We  then went ahead and found our center point on the glenoid face with the drill guide and drilled our central guide pin.  We then reamed for the metaglene, drilled central PEG hole for the metaglene, we impacted that into place, placed a 36 lock screw inferiorly 30 proximally into the base of the coracoid and an 18 nonlocked anterior-posterior with excellent purchase and solid metaglene  security.  We then placed a 38 standard glenoid sphere into position, and then packed that and screwed into place.  Once that was in position, we went ahead and readdress the humeral side.  We thoroughly irrigated the entire wound.  Checked the axillary nerve was in good condition.  We then placed a 12 body size, 1 right hydroxyapatite coated delta Xtend prosthesis using an impaction grafting technique, impacting that in position.  We then trialed with the 38+ 3, felt like it could get a 38+ 6 and the axillary nerve was a little bit tight but because there was a slight little bit of shuck, we thought we could get that 38+ 6 in, so we selected the +6, real poly impacted that in position, and then reduced the shoulder again.  We are pleased with soft tissue balancing.  No gapping with external rotation or with inferior sulcus and the conjoint was nice and tight.  The axillary nerve was a little on the tight side, but felt like it would be satisfactory.  We thoroughly irrigated and then closed deltopectoral wound with interrupted 0 Vicryl suture, followed by 2-0 Vicryl subcutaneous closure, and 4-0 Monocryl for skin.  Steri-Strips applied followed by sterile dressing.  The patient tolerated the surgery well.     Doran Heater. Veverly Fells, M.D.     SRN/MEDQ  D:  10/03/2013  T:  10/03/2013  Job:  [id number]

## 2013-10-03 NOTE — Anesthesia Postprocedure Evaluation (Signed)
Anesthesia Post Note  Patient: Alexis Hopkins  Procedure(s) Performed: Procedure(s) (LRB): RIGHT REVERSE SHOULDER ARTHROPLASTY (Right)  Anesthesia type: general  Patient location: PACU  Post pain: Pain level controlled  Post assessment: Patient's Cardiovascular Status Stable  Last Vitals:  Filed Vitals:   10/03/13 1407  BP: 98/50  Pulse: 83  Temp: 36.3 C  Resp: 18    Post vital signs: Reviewed and stable  Level of consciousness: sedated  Complications: No apparent anesthesia complications

## 2013-10-03 NOTE — Brief Op Note (Signed)
10/03/2013  9:53 AM  PATIENT:  Alexis Hopkins  68 y.o. female  PRE-OPERATIVE DIAGNOSIS:  RIGHT SHOULDER OSTEOARTHRITIS, end stage, rotator cuff insufficiency  POST-OPERATIVE DIAGNOSIS:  right shoulder OA, end stage, rotator cuff insuffiiciency  PROCEDURE:  Procedure(s): RIGHT REVERSE SHOULDER ARTHROPLASTY (Right), DePuy Delta Xtend  SURGEON:  Surgeon(s) and Role:    * Augustin Schooling, MD - Primary  PHYSICIAN ASSISTANT:   ASSISTANTS: Ventura Bruns, PA-C   ANESTHESIA:   regional and general  EBL:  Total I/O In: 1000 [I.V.:1000] Out: 200 [Blood:200]  BLOOD ADMINISTERED:none  DRAINS: none   LOCAL MEDICATIONS USED:  MARCAINE     SPECIMEN:  No Specimen  DISPOSITION OF SPECIMEN:  N/A  COUNTS:  YES  TOURNIQUET:  * No tourniquets in log *  DICTATION: .Other Dictation: Dictation Number [id number]  PLAN OF CARE: Admit to inpatient   PATIENT DISPOSITION:  PACU - hemodynamically stable.   Delay start of Pharmacological VTE agent (>24hrs) due to surgical blood loss or risk of bleeding: not applicable

## 2013-10-03 NOTE — Interval H&P Note (Signed)
History and Physical Interval Note:  10/03/2013 7:23 AM  Alexis Hopkins  has presented today for surgery, with the diagnosis of RIGHT SHOULDER OSTEOARTHRITIS  The various methods of treatment have been discussed with the patient and family. After consideration of risks, benefits and other options for treatment, the patient has consented to  Procedure(s): RIGHT REVERSE SHOULDER ARTHROPLASTY (Right) as a surgical intervention .  The patient's history has been reviewed, patient examined, no change in status, stable for surgery.  I have reviewed the patient's chart and labs.  Questions were answered to the patient's satisfaction.     Salmons,STEVEN R

## 2013-10-03 NOTE — Anesthesia Procedure Notes (Addendum)
Procedure Name: Intubation Date/Time: 10/03/2013 7:39 AM Performed by: Sherilyn Banker Pre-anesthesia Checklist: Patient identified, Emergency Drugs available, Suction available, Patient being monitored and Timeout performed Patient Re-evaluated:Patient Re-evaluated prior to inductionOxygen Delivery Method: Circle system utilized Preoxygenation: Pre-oxygenation with 100% oxygen Intubation Type: IV induction Ventilation: Mask ventilation without difficulty Laryngoscope Size: Miller and 2 Grade View: Grade I Tube type: Oral Tube size: 7.5 mm Number of attempts: 1 Airway Equipment and Method: Stylet Placement Confirmation: ETT inserted through vocal cords under direct vision,  positive ETCO2,  CO2 detector and breath sounds checked- equal and bilateral Secured at: 24 cm Tube secured with: Tape Dental Injury: Teeth and Oropharynx as per pre-operative assessment    Anesthesia Regional Block:  Interscalene brachial plexus block  Pre-Anesthetic Checklist: ,, timeout performed, Correct Patient, Correct Site, Correct Laterality, Correct Procedure, Correct Position, site marked, Risks and benefits discussed,  Surgical consent,  Pre-op evaluation,  At surgeon's request and post-op pain management  Laterality: Right  Prep: chloraprep       Needles:  Injection technique: Single-shot  Needle Type: Echogenic Stimulator Needle     Needle Length: 9cm 9 cm Needle Gauge: 21 and 21 G    Additional Needles:  Procedures: ultrasound guided (picture in chart) and nerve stimulator Interscalene brachial plexus block  Nerve Stimulator or Paresthesia:  Response: 0.4 mA,   Additional Responses:   Narrative:  Start time: 10/03/2013 7:10 AM End time: 10/03/2013 7:20 AM Injection made incrementally with aspirations every 5 mL.  Performed by: Personally  Anesthesiologist: Lillia Abed MD  Additional Notes: Monitors applied. Patient sedated. Sterile prep and drape,hand hygiene and sterile gloves  were used. Relevant anatomy identified.Needle position confirmed.Local anesthetic injected incrementally after negative aspiration. Local anesthetic spread visualized around nerve(s). Vascular puncture avoided. No complications. Image printed for medical record.The patient tolerated the procedure well.

## 2013-10-03 NOTE — Preoperative (Signed)
Beta Blockers   Reason not to administer Beta Blockers:Not Applicable 

## 2013-10-03 NOTE — Progress Notes (Signed)
Utilization review completed.  

## 2013-10-03 NOTE — Transfer of Care (Signed)
Immediate Anesthesia Transfer of Care Note  Patient: Alexis Hopkins  Procedure(s) Performed: Procedure(s): RIGHT REVERSE SHOULDER ARTHROPLASTY (Right)  Patient Location: PACU  Anesthesia Type:General  Level of Consciousness: awake and patient cooperative  Airway & Oxygen Therapy: Patient Spontanous Breathing and Patient connected to face mask oxygen  Post-op Assessment: Report given to PACU RN and Post -op Vital signs reviewed and stable  Post vital signs: Reviewed and stable  Complications: No apparent anesthesia complications

## 2013-10-03 NOTE — Anesthesia Preprocedure Evaluation (Addendum)
Anesthesia Evaluation  Patient identified by MRN, date of birth, ID band Patient awake    Reviewed: Allergy & Precautions, H&P , NPO status , Patient's Chart, lab work & pertinent test results  Airway Mallampati: I TM Distance: >3 FB Neck ROM: Full    Dental  (+) Dental Advisory Given and Teeth Intact   Pulmonary          Cardiovascular hypertension, Pt. on medications     Neuro/Psych TIA   GI/Hepatic   Endo/Other  diabetes, Well Controlled, Type 2, Oral Hypoglycemic Agents  Renal/GU      Musculoskeletal   Abdominal   Peds  Hematology   Anesthesia Other Findings   Reproductive/Obstetrics                          Anesthesia Physical Anesthesia Plan  ASA: II  Anesthesia Plan: General   Post-op Pain Management:    Induction: Intravenous  Airway Management Planned: Oral ETT  Additional Equipment:   Intra-op Plan:   Post-operative Plan: Extubation in OR  Informed Consent: I have reviewed the patients History and Physical, chart, labs and discussed the procedure including the risks, benefits and alternatives for the proposed anesthesia with the patient or authorized representative who has indicated his/her understanding and acceptance.   Dental advisory given  Plan Discussed with: CRNA and Surgeon  Anesthesia Plan Comments:        Anesthesia Quick Evaluation

## 2013-10-04 LAB — BASIC METABOLIC PANEL
BUN: 20 mg/dL (ref 6–23)
CALCIUM: 8.3 mg/dL — AB (ref 8.4–10.5)
CO2: 17 mEq/L — ABNORMAL LOW (ref 19–32)
CREATININE: 1.17 mg/dL — AB (ref 0.50–1.10)
Chloride: 103 mEq/L (ref 96–112)
GFR calc Af Amer: 55 mL/min — ABNORMAL LOW (ref >90)
GFR calc non Af Amer: 47 mL/min — ABNORMAL LOW (ref >90)
GLUCOSE: 189 mg/dL — AB (ref 70–99)
Potassium: 4.6 mEq/L (ref 3.7–5.3)
Sodium: 136 mEq/L — ABNORMAL LOW (ref 137–147)

## 2013-10-04 LAB — GLUCOSE, CAPILLARY
GLUCOSE-CAPILLARY: 168 mg/dL — AB (ref 70–99)
Glucose-Capillary: 171 mg/dL — ABNORMAL HIGH (ref 70–99)
Glucose-Capillary: 114 mg/dL — ABNORMAL HIGH (ref 70–99)
Glucose-Capillary: 143 mg/dL — ABNORMAL HIGH (ref 70–99)

## 2013-10-04 LAB — HEMOGLOBIN AND HEMATOCRIT, BLOOD
HCT: 30.9 % — ABNORMAL LOW (ref 36.0–46.0)
Hemoglobin: 10 g/dL — ABNORMAL LOW (ref 12.0–15.0)

## 2013-10-04 MED ORDER — OXYCODONE-ACETAMINOPHEN 5-325 MG PO TABS: 1.0000 | ORAL_TABLET | ORAL | Status: DC | PRN

## 2013-10-04 MED ORDER — METHOCARBAMOL 500 MG PO TABS: 500.0000 mg | ORAL_TABLET | Freq: Three times a day (TID) | ORAL | Status: DC | PRN

## 2013-10-04 NOTE — Discharge Instructions (Signed)
Keep the incision clean and dry for one week, then ok to get it wet in the shower.  Ok to use the shoulder and remove the sling as tolerated. Use sling out of the house  DO NOT lift push or pull with the shoulder and arm  Follow up in two weeks  [phone]

## 2013-10-04 NOTE — Discharge Summary (Signed)
Physician Discharge Summary   Patient ID: Alexis Hopkins MRN: [id number] DOB/AGE: August 12, 1946 68 y.o.  Admit date: 10/03/2013 Discharge date: 10/04/2013  Admission Diagnoses:  Active Problems:   Arthritis of shoulder region, right, degenerative   Discharge Diagnoses:  Same   Surgeries: Procedure(s): RIGHT REVERSE SHOULDER ARTHROPLASTY on 10/03/2013   Consultants: OT  Discharged Condition: Stable  Hospital Course: Alexis Hopkins is an 68 y.o. female who was admitted 10/03/2013 with a chief complaint of right shoulder pain, and found to have a diagnosis of right shoulder arthritis and rotator cuff insufficiency.  They were brought to the operating room on 10/03/2013 and underwent the above named procedures.    The patient had an uncomplicated hospital course and was stable for discharge.  Recent vital signs:  Filed Vitals:   10/04/13 0612  BP: 112/40  Pulse: 82  Temp: 98.6 F (37 C)  Resp: 18    Recent laboratory studies:  Results for orders placed during the hospital encounter of 10/03/13  GLUCOSE, CAPILLARY      Result Value Range   Glucose-Capillary 100 (*) 70 - 99 mg/dL  GLUCOSE, CAPILLARY      Result Value Range   Glucose-Capillary 133 (*) 70 - 99 mg/dL   Comment 1 Documented in Chart     Comment 2 Notify RN    GLUCOSE, CAPILLARY      Result Value Range   Glucose-Capillary 136 (*) 70 - 99 mg/dL  GLUCOSE, CAPILLARY      Result Value Range   Glucose-Capillary 170 (*) 70 - 99 mg/dL  GLUCOSE, CAPILLARY      Result Value Range   Glucose-Capillary 281 (*) 70 - 99 mg/dL  HEMOGLOBIN AND HEMATOCRIT, BLOOD      Result Value Range   Hemoglobin 10.0 (*) 12.0 - 15.0 g/dL   HCT 30.9 (*) 36.0 - 69.6 %  BASIC METABOLIC PANEL      Result Value Range   Sodium 136 (*) 137 - 147 mEq/L   Potassium 4.6  3.7 - 5.3 mEq/L   Chloride 103  96 - 112 mEq/L   CO2 17 (*) 19 - 32 mEq/L   Glucose, Bld 189 (*) 70 - 99 mg/dL   BUN 20  6 - 23 mg/dL   Creatinine, Ser 1.17 (*) 0.50 -  1.10 mg/dL   Calcium 8.3 (*) 8.4 - 10.5 mg/dL   GFR calc non Af Amer 47 (*) >90 mL/min   GFR calc Af Amer 55 (*) >90 mL/min  GLUCOSE, CAPILLARY      Result Value Range   Glucose-Capillary 104 (*) 70 - 99 mg/dL  GLUCOSE, CAPILLARY      Result Value Range   Glucose-Capillary 171 (*) 70 - 99 mg/dL    Discharge Medications:     Medication List    ASK your doctor about these medications       amLODipine 10 MG tablet  Commonly known as:  NORVASC  Take 10 mg by mouth daily.     calcium-vitamin D 500-200 MG-UNIT per tablet  Commonly known as:  OSCAL WITH D  Take 1 tablet by mouth 2 (two) times daily.     clopidogrel 75 MG tablet  Commonly known as:  PLAVIX  Take 75 mg by mouth daily with breakfast.     colesevelam 625 MG tablet  Commonly known as:  WELCHOL  Take 1,875 mg by mouth 2 (two) times daily with a meal.     glyBURIDE micronized 3 MG tablet  Commonly  known as:  GLYNASE  Take 3 mg by mouth daily with breakfast.     hydrochlorothiazide 25 MG tablet  Commonly known as:  HYDRODIURIL  Take 25 mg by mouth daily.     lisinopril 40 MG tablet  Commonly known as:  PRINIVIL,ZESTRIL  Take 40 mg by mouth daily.     metFORMIN 1000 MG tablet  Commonly known as:  GLUCOPHAGE  Take 1,000 mg by mouth 2 (two) times daily with a meal.     multivitamin with minerals Tabs tablet  Take 1 tablet by mouth daily.     OSTEO BI-FLEX TRIPLE STRENGTH PO  Take 1 tablet by mouth 2 (two) times daily.     pioglitazone 30 MG tablet  Commonly known as:  ACTOS  Take 30 mg by mouth daily.     rosuvastatin 10 MG tablet  Commonly known as:  CRESTOR  Take 10 mg by mouth daily.        Diagnostic Studies: Dg Chest 2 View  09/23/2013   CLINICAL DATA:  Pre-surgical evaluation  EXAM: CHEST  2 VIEW  COMPARISON:  11/17/2007  FINDINGS: Low lung volumes. There is no evidence of focal infiltrates, effusions, or edema. Cardiac silhouette is within normal limits. Healed rib fractures identified on the  right. There is dextroscoliosis of the thoracolumbar spine. Degenerative and post traumatic changes identified within the right shoulder.  IMPRESSION: No active cardiopulmonary disease.   Electronically Signed   By: Margaree Mackintosh M.D.   On: 09/23/2013 10:34   Dg Shoulder Right  10/03/2013   CLINICAL DATA:  Post right total shoulder replacement  EXAM: RIGHT SHOULDER - 2+ VIEW  COMPARISON:  Right shoulder CT -07/14/2013  FINDINGS: Post right total shoulder replacement. Alignment appears near anatomic given obliquity. No definite fracture or dislocation on this solitary provided radiograph.  There is a minimal amount of expected subcutaneous emphysema and intra-articular air about the operative site. No definite radiopaque foreign body.  Limited visualization adjacent thorax suggests hypoventilation and right perihilar and basilar opacities, incompletely evaluated. Several old right-sided posterior lateral rib fractures are incidentally noted.  IMPRESSION: Post right total shoulder replacement without definite evidence of complication.   Electronically Signed   By: Sandi Mariscal M.D.   On: 10/03/2013 10:44    Disposition: home        Follow-up Information   Call Augustin Schooling, MD. [phone])    Specialty:  Orthopedic Surgery   Contact information:   [address] [place] [address] [phone]        Signed: EMMERSEN, GARRAWAY 10/04/2013, 7:57 AM

## 2013-10-04 NOTE — Progress Notes (Signed)
Orthopedics Progress Note  Subjective: Patient still hurting this morning  Objective:  Filed Vitals:   10/04/13 0612  BP: 112/40  Pulse: 82  Temp: 98.6 F (37 C)  Resp: 18    General: Awake and alert  Musculoskeletal: right shoulder dressing intact, sensation intact including ax nerve.  Unable to move shoulder due to pain. Neurovascularly intact  Lab Results  Component Value Date   WBC 6.3 09/23/2013   HGB 10.0* 10/04/2013   HCT 30.9* 10/04/2013   MCV 90.2 09/23/2013   PLT 233 09/23/2013       Component Value Date/Time   NA 136* 10/04/2013 0625   K 4.6 10/04/2013 0625   CL 103 10/04/2013 0625   CO2 17* 10/04/2013 0625   GLUCOSE 189* 10/04/2013 0625   BUN 20 10/04/2013 0625   CREATININE 1.17* 10/04/2013 0625   CALCIUM 8.3* 10/04/2013 0625   GFRNONAA 47* 10/04/2013 0625   GFRAA 55* 10/04/2013 0625    No results found for this basename: INR, PROTIME    Assessment/Plan: POD #1 s/p Procedure(s): RIGHT REVERSE SHOULDER ARTHROPLASTY OT today for AROM and ADLs, NWB on right No need for home health therapy, I would prefer for the patient just to move and use the shoulder as she can.  Doran Heater. Veverly Fells, MD 10/04/2013 7:49 AM

## 2013-10-04 NOTE — Evaluation (Signed)
Occupational Therapy Evaluation Patient Details Name: Alexis Hopkins MRN: [id number] DOB: [birth year] Today's Date: 10/04/2013 Time: 0938-1829 OT Time Calculation (min): 22 min  OT Assessment / Plan / Recommendation History of present illness Reverse RTSA   Clinical Impression   This 68 yo female admitted and underwent above presents to acute OT with need for continued education before she D/C's tomorrow.     OT Assessment  Patient needs continued OT Services    Follow Up Recommendations   (follow up per MD)       Equipment Recommendations  None recommended by OT       Frequency  Min 2X/week    Precautions / Restrictions Precautions Precautions: Shoulder Type of Shoulder Precautions: NO ABDUCTION Shoulder Interventions: For comfort Required Braces or Orthoses: Sling Restrictions Weight Bearing Restrictions: Yes RUE Weight Bearing: Non weight bearing   Pertinent Vitals/Pain 3/10 RUE with activity; repositioned and called NT for more ice for ice bag       OT Diagnosis: Generalized weakness;Acute pain  OT Problem List: Decreased range of motion;Pain;Impaired UE functional use OT Treatment Interventions: Self-care/ADL training;Patient/family education;Therapeutic exercise   OT Goals(Current goals can be found in the care plan section) Acute Rehab OT Goals Patient Stated Goal: Home tomorrow OT Goal Formulation: With patient Time For Goal Achievement: 10/11/13 Potential to Achieve Goals: Good  Visit Information  Last OT Received On: 10/04/13 Assistance Needed: +1 History of Present Illness: Reverse RTSA       Prior Functioning     Home Living Family/patient expects to be discharged to:: Private residence Living Arrangements: Spouse/significant other Available Help at Discharge: Family;Available 24 hours/day Type of Home: House Home Access: Ramped entrance Home Layout: One level Home Equipment: Grab bars - tub/shower;Shower seat Prior Function Level of  Independence: Independent Communication Communication: No difficulties Dominant Hand: Right         Vision/Perception Vision - History Patient Visual Report: No change from baseline   Cognition  Cognition Arousal/Alertness: Awake/alert Behavior During Therapy: WFL for tasks assessed/performed Overall Cognitive Status: Within Functional Limits for tasks assessed    Extremity/Trunk Assessment Upper Extremity Assessment Upper Extremity Assessment: RUE deficits/detail RUE Deficits / Details: reverse TSA RUE Coordination: decreased gross motor     Mobility Bed Mobility Bed Mobility: Supine to Sit;Sitting - Scoot to Edge of Bed;Sit to Supine Supine to Sit: 6: Modified independent (Device/Increase time);HOB flat Sitting - Scoot to Edge of Bed: 6: Modified independent (Device/Increase time) Sit to Supine: 6: Modified independent (Device/Increase time);HOB flat Transfers Transfers: Sit to Stand;Stand to Sit Sit to Stand: 4: Min guard;Without upper extremity assist;From chair/3-in-1 Stand to Sit: 4: Min guard;Without upper extremity assist;To chair/3-in-1     Exercise Other Exercises Other Exercises: Educated and pt performed 10 reps of each of pendulums, forward flexion (supine--to about 20 degrees; knows to not go above 90 degrees), external rotation with long shoe horn (can get almost to neutral--instructions per protcol are to tolerance), and elbow felxion/extension Donning/doffing shirt without moving shoulder:  (verbalized understanding) Method for sponge bathing under operated UE:  (verbalized understanding) Donning/doffing sling/immobilizer:  (verbalized understanding) Correct positioning of sling/immobilizer:  (verbalized understanding) Pendulum exercises (written home exercise program): Min-guard ROM for elbow, wrist and digits of operated UE: Supervision/safety Sling wearing schedule (on at all times/off for ADL's): Independent Dressing change:  (NA) Positioning of UE  while sleeping: Independent      End of Session OT - End of Session Activity Tolerance: Patient tolerated treatment well Patient left: in bed;with call  bell/phone within reach;with family/visitor present Nurse Communication:  (If IV needs to be put hooked back up)    Almon Register [phone] 10/04/2013, 11:51 AM

## 2013-10-05 LAB — GLUCOSE, CAPILLARY: GLUCOSE-CAPILLARY: 129 mg/dL — AB (ref 70–99)

## 2013-10-05 MED ORDER — PROMETHAZINE HCL 25 MG PO TABS: 25.0000 mg | ORAL_TABLET | Freq: Four times a day (QID) | ORAL | Status: DC | PRN

## 2013-10-05 NOTE — Progress Notes (Signed)
Orthopedics Progress Note  Subjective: Patient had some nausea with the pain meds but ready to go home.  Objective:  Filed Vitals:   10/05/13 0533  BP: 97/40  Pulse: 90  Temp: 98.2 F (36.8 C)  Resp: 18    General: Awake and alert  Musculoskeletal: Right shoulder wound looks good, no erythema, no drainage.  Dressing changed Neurovascularly intact  Lab Results  Component Value Date   WBC 6.3 09/23/2013   HGB 10.0* 10/04/2013   HCT 30.9* 10/04/2013   MCV 90.2 09/23/2013   PLT 233 09/23/2013       Component Value Date/Time   NA 136* 10/04/2013 0625   K 4.6 10/04/2013 0625   CL 103 10/04/2013 0625   CO2 17* 10/04/2013 0625   GLUCOSE 189* 10/04/2013 0625   BUN 20 10/04/2013 0625   CREATININE 1.17* 10/04/2013 0625   CALCIUM 8.3* 10/04/2013 0625   GFRNONAA 47* 10/04/2013 0625   GFRAA 55* 10/04/2013 0625    No results found for this basename: INR, PROTIME    Assessment/Plan: POD #1 s/p Procedure(s): RIGHT REVERSE SHOULDER ARTHROPLASTY Right shoulder exercises reviewed.  No external rotation beyond neutral. D/C to home Follow up in two weeks  Doran Heater. Veverly Fells, MD 10/05/2013 8:27 AM

## 2013-10-05 NOTE — Progress Notes (Signed)
Occupational Therapy Treatment Patient Details Name: Alexis Hopkins MRN: [id number] DOB: 1946/02/25 Today's Date: 10/05/2013 Time: 0623-7628 OT Time Calculation (min): 30 min  OT Assessment / Plan / Recommendation  History of present illness Reverse RTSA   OT comments  Pt moving well during session. Reviewed shoulder information with spouse and pt. Pt performed exercises.   Follow Up Recommendations   (follow up per MD)    Barriers to Discharge       Equipment Recommendations  None recommended by OT    Recommendations for Other Services    Frequency Min 2X/week   Progress towards OT Goals Progress towards OT goals: Progressing toward goals  Plan Discharge plan remains appropriate    Precautions / Restrictions Precautions Precautions: Shoulder Type of Shoulder Precautions: NO ABDUCTION Shoulder Interventions: Shoulder sling/immobilizer;For comfort Precaution Booklet Issued:  (pt has shoulder handout) Precaution Comments: Reviewed precautions with pt and husband Required Braces or Orthoses: Sling Restrictions Weight Bearing Restrictions: Yes RUE Weight Bearing: Non weight bearing   Pertinent Vitals/Pain No pain at end of session with shoulder not moving. Pain in back during session-took breaks.     ADL  Upper Body Dressing: Minimal assistance (donned house coat and sling) Where Assessed - Upper Body Dressing: Unsupported sitting Toilet Transfer: Supervision/safety Toilet Transfer Method: Sit to Loss adjuster, chartered: Other (comment) (from bed ) Equipment Used: Other (comment) (shoulder sling) Transfers/Ambulation Related to ADLs: Supervision ADL Comments: Reviewed shoulder information on handout. Pt performed exercises. Recommended having chair behind pt when she performs pendulums and also have spouse with her as her back was hurting and she reported knee problems as well. Educated on better technique for spouse to assist with bed mobility.  OT supported pt's  right elbow during shoulder flexion exercise to be sure she avoided abduction and educated spouse.    OT Diagnosis:    OT Problem List:   OT Treatment Interventions:     OT Goals(current goals can now be found in the care plan section) Acute Rehab OT Goals Patient Stated Goal: not stated OT Goal Formulation: With patient Time For Goal Achievement: 10/11/13 Potential to Achieve Goals: Good ADL Goals Pt/caregiver will Perform Home Exercise Program: Increased ROM;Right Upper extremity;With Supervision;With written HEP provided Additional ADL Goal #1: Family will know how they need to A pt with exercises and BADLs prn  Visit Information  Last OT Received On: 10/05/13 Assistance Needed: +1 History of Present Illness: Reverse RTSA    Subjective Data      Prior Functioning       Cognition  Cognition Arousal/Alertness: Awake/alert Behavior During Therapy: WFL for tasks assessed/performed Overall Cognitive Status: Within Functional Limits for tasks assessed    Mobility  Bed Mobility Bed Mobility: Supine to Sit;Sit to Supine Supine to Sit: 4: Min assist Sit to Supine: 5: Supervision Details for Bed Mobility Assistance: Spouse assisted with trunk. Educated to assist behind shoulder rather than pulling on pt's left arm. Transfers Transfers: Sit to Stand;Stand to Sit Sit to Stand: 5: Supervision;From bed Stand to Sit: 5: Supervision;To bed    Exercises  Shoulder Exercises Pendulum Exercise: AAROM;Right;Standing;15 reps;10 reps (approximately 10-15 reps of each) Shoulder Flexion: AAROM;Right;15 reps;Supine (approximately 15 reps) Elbow Flexion: AROM;Right;10 reps;Standing Elbow Extension: AROM;Right;10 reps;Standing Wrist Flexion: AROM;Right;10 reps;Standing Wrist Extension: AROM;Right;10 reps;Standing Digit Composite Flexion: AROM;Right;10 reps;Standing Composite Extension: AROM;Right;10 reps;Standing Other Exercises Other Exercises: pt reported that MD did not want her to  perform external rotation exercises at this time Method for sponge bathing under operated  UE:  (verbalized understanding) Donning/doffing sling/immobilizer: Caregiver independent with task (spouse assisted and pt also helped) Correct positioning of sling/immobilizer: Supervision/safety Pendulum exercises (written home exercise program): Minimal assistance (assisted at hips for more movement) ROM for elbow, wrist and digits of operated UE: Supervision/safety Sling wearing schedule (on at all times/off for ADL's): Independent Positioning of UE while sleeping:  (verbalized understanding)   Balance     End of Session OT - End of Session Equipment Utilized During Treatment: Other (comment) (shoulder sling) Activity Tolerance: Patient tolerated treatment well Patient left: in bed;with call bell/phone within reach;with family/visitor present  GO     Benito Mccreedy OTR/L [phone] 10/05/2013, 9:20 AM

## 2013-10-06 ENCOUNTER — Encounter (HOSPITAL_COMMUNITY): Payer: Self-pay | Admitting: Orthopedic Surgery

## 2013-11-25 ENCOUNTER — Other Ambulatory Visit: Payer: Self-pay | Admitting: Orthopedic Surgery

## 2013-11-25 DIAGNOSIS — S42123A Displaced fracture of acromial process, unspecified shoulder, initial encounter for closed fracture: Secondary | ICD-10-CM

## 2013-11-28 ENCOUNTER — Ambulatory Visit
Admission: RE | Admit: 2013-11-28 | Discharge: 2013-11-28 | Disposition: A | Discharge status: Home / Self Care | Payer: Medicare Other | Source: Ambulatory Visit | Attending: Orthopedic Surgery | Admitting: Orthopedic Surgery

## 2013-11-28 DIAGNOSIS — S42123A Displaced fracture of acromial process, unspecified shoulder, initial encounter for closed fracture: Secondary | ICD-10-CM

## 2013-12-01 ENCOUNTER — Other Ambulatory Visit: Payer: Medicare Other

## 2014-01-08 ENCOUNTER — Other Ambulatory Visit: Payer: Self-pay | Admitting: Dermatology

## 2014-01-08 DIAGNOSIS — C4491 Basal cell carcinoma of skin, unspecified: Secondary | ICD-10-CM

## 2014-01-08 HISTORY — DX: Basal cell carcinoma of skin, unspecified: C44.91

## 2014-01-29 ENCOUNTER — Ambulatory Visit
Admission: RE | Admit: 2014-01-29 | Discharge: 2014-01-29 | Disposition: A | Discharge status: Home / Self Care | Payer: Medicare Other | Source: Ambulatory Visit

## 2014-01-29 ENCOUNTER — Other Ambulatory Visit: Payer: Self-pay

## 2014-01-29 DIAGNOSIS — Z1231 Encounter for screening mammogram for malignant neoplasm of breast: Secondary | ICD-10-CM

## 2014-03-13 ENCOUNTER — Other Ambulatory Visit: Payer: Self-pay | Admitting: Family Medicine

## 2014-03-13 DIAGNOSIS — E041 Nontoxic single thyroid nodule: Secondary | ICD-10-CM

## 2014-03-18 ENCOUNTER — Ambulatory Visit
Admission: RE | Admit: 2014-03-18 | Discharge: 2014-03-18 | Disposition: A | Discharge status: Home / Self Care | Payer: Medicare Other | Source: Ambulatory Visit | Attending: Family Medicine | Admitting: Family Medicine

## 2014-03-18 DIAGNOSIS — E041 Nontoxic single thyroid nodule: Secondary | ICD-10-CM

## 2014-06-16 ENCOUNTER — Other Ambulatory Visit: Payer: Self-pay | Admitting: Dermatology

## 2014-08-06 ENCOUNTER — Other Ambulatory Visit: Payer: Self-pay | Admitting: Dermatology

## 2014-09-23 ENCOUNTER — Other Ambulatory Visit (HOSPITAL_COMMUNITY): Payer: Self-pay | Admitting: *Deleted

## 2014-09-23 NOTE — Progress Notes (Signed)
Please correct op permit orders in EPIC - it has hip and knee and needs to be changed to knee - thank you

## 2014-09-23 NOTE — Patient Instructions (Addendum)
Alexis Hopkins  09/23/2014   Your procedure is scheduled on: 10/05/13   Report to [facility]  Entrance and follow signs to               [place] at 8:30 AM.   Call this number if you have problems the morning of surgery [phone]   Remember:  Do not eat food or drink liquids :After Midnight.     Take these medicines the morning of surgery with A SIP OF WATER: AMLODIPINE                               You may not have any metal on your body including hair pins and              piercings  Do not wear jewelry, make-up, lotions, powders or perfumes.             Do not wear nail polish.  Do not shave  48 hours prior to surgery.              Men may shave face and neck.   Do not bring valuables to the hospital. [place].  Contacts, dentures or bridgework may not be worn into surgery.  Leave suitcase in the car. After surgery it may be brought to your room.     Patients discharged the day of surgery will not be allowed to drive home.  Name and phone number of your driver:  Special Instructions: N/A              Please read over the following fact sheets you were given: _____________________________________________________________________                                                     [place]  Before surgery, you can play an important role.  Because skin is not sterile, your skin needs to be as free of germs as possible.  You can reduce the number of germs on your skin by washing with CHG (chlorahexidine gluconate) soap before surgery.  CHG is an antiseptic cleaner which kills germs and bonds with the skin to continue killing germs even after washing. Please DO NOT use if you have an allergy to CHG or antibacterial soaps.  If your skin becomes reddened/irritated stop using the CHG and inform your nurse when you arrive at Short Stay. Do not shave (including legs and  underarms) for at least 48 hours prior to the first CHG shower.  You may shave your face. Please follow these instructions carefully:   1.  Shower with CHG Soap the night before surgery and the  morning of Surgery.   2.  If you choose to wash your hair, wash your hair first as usual with your  normal  Shampoo.   3.  After you shampoo, rinse your hair and body thoroughly to remove the  shampoo.  4.  Use CHG as you would any other liquid soap.  You can apply chg directly  to the skin and wash . Gently wash with scrungie or clean wascloth    5.  Apply the CHG Soap to your body ONLY FROM THE NECK DOWN.   Do not use on open                           Wound or open sores. Avoid contact with eyes, ears mouth and genitals (private parts).                        Genitals (private parts) with your normal soap.              6.  Wash thoroughly, paying special attention to the area where your surgery  will be performed.   7.  Thoroughly rinse your body with warm water from the neck down.   8.  DO NOT shower/wash with your normal soap after using and rinsing off  the CHG Soap .                9.  Pat yourself dry with a clean towel.             10.  Wear clean pajamas.             11.  Place clean sheets on your bed the night of your first shower and do not  sleep with pets.  Day of Surgery : Do not apply any lotions/deodorants the morning of surgery.  Please wear clean clothes to the hospital/surgery center.  FAILURE TO FOLLOW THESE INSTRUCTIONS MAY RESULT IN THE CANCELLATION OF YOUR SURGERY    PATIENT SIGNATURE_________________________________  ______________________________________________________________________     Alexis Hopkins  An incentive spirometer is a tool that can help keep your lungs clear and active. This tool measures how well you are filling your lungs with each breath. Taking long deep breaths may help reverse or decrease  the chance of developing breathing (pulmonary) problems (especially infection) following:  A long period of time when you are unable to move or be active. BEFORE THE PROCEDURE   If the spirometer includes an indicator to show your best effort, your nurse or respiratory therapist will set it to a desired goal.  If possible, sit up straight or lean slightly forward. Try not to slouch.  Hold the incentive spirometer in an upright position. INSTRUCTIONS FOR USE   Sit on the edge of your bed if possible, or sit up as far as you can in bed or on a chair.  Hold the incentive spirometer in an upright position.  Breathe out normally.  Place the mouthpiece in your mouth and seal your lips tightly around it.  Breathe in slowly and as deeply as possible, raising the piston or the ball toward the top of the column.  Hold your breath for 3-5 seconds or for as long as possible. Allow the piston or ball to fall to the bottom of the column.  Remove the mouthpiece from your mouth and breathe out normally.  Rest for a few seconds and repeat Steps 1 through 7 at least 10 times every 1-2 hours when you are awake. Take your time and take a few normal breaths between deep breaths.  The spirometer may include an indicator to show your best effort. Use the indicator as a goal to work toward during  each repetition.  After each set of 10 deep breaths, practice coughing to be sure your lungs are clear. If you have an incision (the cut made at the time of surgery), support your incision when coughing by placing a pillow or rolled up towels firmly against it. Once you are able to get out of bed, walk around indoors and cough well. You may stop using the incentive spirometer when instructed by your caregiver.  RISKS AND COMPLICATIONS  Take your time so you do not get dizzy or light-headed.  If you are in pain, you may need to take or ask for pain medication before doing incentive spirometry. It is harder to  take a deep breath if you are having pain. AFTER USE  Rest and breathe slowly and easily.  It can be helpful to keep track of a log of your progress. Your caregiver can provide you with a simple table to help with this. If you are using the spirometer at home, follow these instructions: [place] IF:   You are having difficultly using the spirometer.  You have trouble using the spirometer as often as instructed.  Your pain medication is not giving enough relief while using the spirometer.  You develop fever of 100.5 F (38.1 C) or higher. SEEK IMMEDIATE MEDICAL CARE IF:   You cough up bloody sputum that had not been present before.  You develop fever of 102 F (38.9 C) or greater.  You develop worsening pain at or near the incision site. MAKE SURE YOU:   Understand these instructions.  Will watch your condition.  Will get help right away if you are not doing well or get worse. Document Released: 01/29/2007 Document Revised: 12/11/2011 Document Reviewed: 04/01/2007 ExitCare Patient Information 2014 ExitCare, [place].   ________________________________________________________________________  WHAT IS A BLOOD TRANSFUSION? Blood Transfusion Information  A transfusion is the replacement of blood or some of its parts. Blood is made up of multiple cells which provide different functions.  Red blood cells carry oxygen and are used for blood loss replacement.  White blood cells fight against infection.  Platelets control bleeding.  Plasma helps clot blood.  Other blood products are available for specialized needs, such as hemophilia or other clotting disorders. BEFORE THE TRANSFUSION  Who gives blood for transfusions?   Healthy volunteers who are fully evaluated to make sure their blood is safe. This is blood bank blood. Transfusion therapy is the safest it has ever been in the practice of medicine. Before blood is taken from a donor, a complete history is taken to  make sure that person has no history of diseases nor engages in risky social behavior (examples are intravenous drug use or sexual activity with multiple partners). The donor's travel history is screened to minimize risk of transmitting infections, such as malaria. The donated blood is tested for signs of infectious diseases, such as HIV and hepatitis. The blood is then tested to be sure it is compatible with you in order to minimize the chance of a transfusion reaction. If you or a relative donates blood, this is often done in anticipation of surgery and is not appropriate for emergency situations. It takes many days to process the donated blood. RISKS AND COMPLICATIONS Although transfusion therapy is very safe and saves many lives, the main dangers of transfusion include:   Getting an infectious disease.  Developing a transfusion reaction. This is an allergic reaction to something in the blood you were given. Every precaution is taken to prevent  this. The decision to have a blood transfusion has been considered carefully by your caregiver before blood is given. Blood is not given unless the benefits outweigh the risks. AFTER THE TRANSFUSION  Right after receiving a blood transfusion, you will usually feel much better and more energetic. This is especially true if your red blood cells have gotten low (anemic). The transfusion raises the level of the red blood cells which carry oxygen, and this usually causes an energy increase.  The nurse administering the transfusion will monitor you carefully for complications. HOME CARE INSTRUCTIONS  No special instructions are needed after a transfusion. You may find your energy is better. Speak with your caregiver about any limitations on activity for underlying diseases you may have. SEEK MEDICAL CARE IF:   Your condition is not improving after your transfusion.  You develop redness or irritation at the intravenous (IV) site. SEEK IMMEDIATE MEDICAL CARE  IF:  Any of the following symptoms occur over the next 12 hours:  Shaking chills.  You have a temperature by mouth above 102 F (38.9 C), not controlled by medicine.  Chest, back, or muscle pain.  People around you feel you are not acting correctly or are confused.  Shortness of breath or difficulty breathing.  Dizziness and fainting.  You get a rash or develop hives.  You have a decrease in urine output.  Your urine turns a dark color or changes to pink, red, or brown. Any of the following symptoms occur over the next 10 days:  You have a temperature by mouth above 102 F (38.9 C), not controlled by medicine.  Shortness of breath.  Weakness after normal activity.  The white part of the eye turns yellow (jaundice).  You have a decrease in the amount of urine or are urinating less often.  Your urine turns a dark color or changes to pink, red, or brown. Document Released: 09/15/2000 Document Revised: 12/11/2011 Document Reviewed: 05/04/2008 [facility] Patient Information 2014 [place], [place].  _______________________________________________________________________

## 2014-09-28 ENCOUNTER — Ambulatory Visit (HOSPITAL_COMMUNITY)
Admission: RE | Admit: 2014-09-28 | Discharge: 2014-09-28 | Disposition: A | Discharge status: Home / Self Care | Payer: Medicare Other | Source: Ambulatory Visit | Attending: Anesthesiology | Admitting: Anesthesiology

## 2014-09-28 ENCOUNTER — Other Ambulatory Visit: Payer: Self-pay

## 2014-09-28 ENCOUNTER — Encounter (HOSPITAL_COMMUNITY): Payer: Self-pay

## 2014-09-28 ENCOUNTER — Encounter (HOSPITAL_COMMUNITY)
Admission: RE | Admit: 2014-09-28 | Discharge: 2014-09-28 | Disposition: A | Discharge status: Home / Self Care | Payer: Medicare Other | Source: Ambulatory Visit | Attending: Orthopedic Surgery | Admitting: Orthopedic Surgery

## 2014-09-28 DIAGNOSIS — E119 Type 2 diabetes mellitus without complications: Secondary | ICD-10-CM | POA: Insufficient documentation

## 2014-09-28 DIAGNOSIS — I10 Essential (primary) hypertension: Secondary | ICD-10-CM

## 2014-09-28 DIAGNOSIS — Z01818 Encounter for other preprocedural examination: Secondary | ICD-10-CM | POA: Insufficient documentation

## 2014-09-28 HISTORY — DX: Personal history of other malignant neoplasm of skin: Z85.828

## 2014-09-28 HISTORY — DX: Age-related osteoporosis without current pathological fracture: M81.0

## 2014-09-28 HISTORY — DX: Family history of other specified conditions: Z84.89

## 2014-09-28 LAB — BASIC METABOLIC PANEL
ANION GAP: 8 (ref 5–15)
BUN: 21 mg/dL (ref 6–23)
CHLORIDE: 104 meq/L (ref 96–112)
CO2: 26 mmol/L (ref 19–32)
Calcium: 9.3 mg/dL (ref 8.4–10.5)
Creatinine, Ser: 1.07 mg/dL (ref 0.50–1.10)
GFR calc Af Amer: 61 mL/min — ABNORMAL LOW (ref >90)
GFR calc non Af Amer: 52 mL/min — ABNORMAL LOW (ref >90)
Glucose, Bld: 92 mg/dL (ref 70–99)
Potassium: 4.2 mmol/L (ref 3.5–5.1)
SODIUM: 138 mmol/L (ref 135–145)

## 2014-09-28 LAB — SURGICAL PCR SCREEN
MRSA, PCR: NEGATIVE
Staphylococcus aureus: POSITIVE — AB

## 2014-09-28 LAB — URINE MICROSCOPIC-ADD ON

## 2014-09-28 LAB — URINALYSIS, ROUTINE W REFLEX MICROSCOPIC
Bilirubin Urine: NEGATIVE
Glucose, UA: NEGATIVE mg/dL
Ketones, ur: NEGATIVE mg/dL
Nitrite: NEGATIVE
PH: 5 (ref 5.0–8.0)
PROTEIN: NEGATIVE mg/dL
Specific Gravity, Urine: 1.01 (ref 1.005–1.030)
Urobilinogen, UA: 0.2 mg/dL (ref 0.0–1.0)

## 2014-09-28 LAB — CBC
HEMATOCRIT: 36.4 % (ref 36.0–46.0)
HEMOGLOBIN: 11.1 g/dL — AB (ref 12.0–15.0)
MCH: 25.3 pg — ABNORMAL LOW (ref 26.0–34.0)
MCHC: 30.5 g/dL (ref 30.0–36.0)
MCV: 83.1 fL (ref 78.0–100.0)
PLATELETS: 331 10*3/uL (ref 150–400)
RBC: 4.38 MIL/uL (ref 3.87–5.11)
RDW: 15.6 % — ABNORMAL HIGH (ref 11.5–15.5)
WBC: 5.5 10*3/uL (ref 4.0–10.5)

## 2014-09-28 LAB — ABO/RH: ABO/RH(D): AB POS

## 2014-09-28 LAB — APTT: aPTT: 33 seconds (ref 24–37)

## 2014-09-28 LAB — PROTIME-INR
INR: 0.96 (ref 0.00–1.49)
PROTHROMBIN TIME: 12.9 s (ref 11.6–15.2)

## 2014-10-04 NOTE — H&P (Signed)
TOTAL KNEE ADMISSION H&P  Patient is being admitted for right total knee arthroplasty.  Subjective:  Chief Complaint:    Right knee primary OA / pain.  HPI: Alexis Hopkins, 69 y.o. female, has a history of pain and functional disability in the right knee due to arthritis and has failed non-surgical conservative treatments for greater than 12 weeks to include NSAID's and/or analgesics, corticosteriod injections, viscosupplementation injections, use of assistive devices and activity modification.  Onset of symptoms was gradual, starting >10 years ago with gradually worsening course since that time. The patient noted prior procedures on the knee to include  arthroscopy on the right knee(s).  Patient currently rates pain in the right knee(s) at 10 out of 10 with activity. Patient has night pain, worsening of pain with activity and weight bearing, pain that interferes with activities of daily living, pain with passive range of motion, crepitus and joint swelling.  Patient has evidence of periarticular osteophytes and joint space narrowing by imaging studies.  There is no active infection.  Risks, benefits and expectations were discussed with the patient.  Risks including but not limited to the risk of anesthesia, blood clots, nerve damage, blood vessel damage, failure of the prosthesis, infection and up to and including death.  Patient understand the risks, benefits and expectations and wishes to proceed with surgery.   PCP: Mayra Neer, MD  D/C Plans:      Home with HHPT  Post-op Meds:       No Rx given  Tranexamic Acid:      To be given - topically (hx of TIA)  Decadron:      Is to be given  FYI:     Plavix and ASA post-op  Norco post-op    Patient Active Problem List   Diagnosis Date Noted  . Arthritis of shoulder region, right, degenerative 10/03/2013   Past Medical History  Diagnosis Date  . Hypertension   . Diabetes mellitus without complication   . TIA (transient ischemic  attack) 1994    Takes Plavix  . History of kidney stones   . Hyperlipemia   . Arthritis   . Family history of adverse reaction to anesthesia     "mother always was deathly sick"  . Osteoporosis   . Hx of skin cancer, basal cell     Past Surgical History  Procedure Laterality Date  . Rotator cuff repair Right   . Shoulder arthroscopy Right   . Knee arthroscopy      X 1 on right; X 2 on left  . Abdominal hysterectomy  1995  . Reverse shoulder arthroplasty Right 10/03/2013    Procedure: RIGHT REVERSE SHOULDER ARTHROPLASTY;  Surgeon: Augustin Schooling, MD;  Location: [place];  Service: Orthopedics;  Laterality: Right;  . Mohs surgery      No prescriptions prior to admission   Allergies  Allergen Reactions  . Shellfish Allergy Hives    Shrimp.    History  Substance Use Topics  . Smoking status: Never Smoker   . Smokeless tobacco: Not on file  . Alcohol Use: No    No family history on file.   Review of Systems  Constitutional: Negative.   HENT: Negative.   Eyes: Negative.   Respiratory: Negative.   Cardiovascular: Negative.   Gastrointestinal: Negative.   Genitourinary: Positive for urgency and frequency.  Musculoskeletal: Positive for myalgias and joint pain.  Skin: Negative.   Neurological: Negative.   Endo/Heme/Allergies: Positive for environmental allergies.  Psychiatric/Behavioral: Negative.  Objective:  Physical Exam  Constitutional: She is oriented to person, place, and time. She appears well-developed and well-nourished.  HENT:  Head: Normocephalic and atraumatic.  Eyes: Pupils are equal, round, and reactive to light.  Neck: Neck supple. No JVD present. No tracheal deviation present. No thyromegaly present.  Cardiovascular: Normal rate, regular rhythm, normal heart sounds and intact distal pulses.   Respiratory: Effort normal and breath sounds normal. No stridor. No respiratory distress. She has no wheezes.  GI: Soft. There is no tenderness. There is no  guarding.  Musculoskeletal:       Right knee: She exhibits decreased range of motion, swelling and bony tenderness. She exhibits no ecchymosis, no deformity, no laceration and no erythema. Tenderness found.  Lymphadenopathy:    She has no cervical adenopathy.  Neurological: She is alert and oriented to person, place, and time.  Skin: Skin is warm and dry.  Psychiatric: She has a normal mood and affect.      Labs:  Estimated body mass index is 37.03 kg/(m^2) as calculated from the following:   Height as of 09/23/13: 5\' 2"  (1.575 m).   Weight as of 09/23/13: 91.853 kg (202 lb 8 oz).   Imaging Review Plain radiographs demonstrate severe degenerative joint disease of the right knee(s). The overall alignment is neutral. The bone quality appears to be good for age and reported activity level.  Assessment/Plan:  End stage arthritis, right knee   The patient history, physical examination, clinical judgment of the provider and imaging studies are consistent with end stage degenerative joint disease of the right knee(s) and total knee arthroplasty is deemed medically necessary. The treatment options including medical management, injection therapy arthroscopy and arthroplasty were discussed at length. The risks and benefits of total knee arthroplasty were presented and reviewed. The risks due to aseptic loosening, infection, stiffness, patella tracking problems, thromboembolic complications and other imponderables were discussed. The patient acknowledged the explanation, agreed to proceed with the plan and consent was signed. Patient is being admitted for inpatient treatment for surgery, pain control, PT, OT, prophylactic antibiotics, VTE prophylaxis, progressive ambulation and ADL's and discharge planning. The patient is planning to be discharged home with home health services.     [place] Dereka Lueras   PA-C  10/04/2014, 5:25 PM

## 2014-10-05 ENCOUNTER — Encounter (HOSPITAL_COMMUNITY): Payer: Self-pay | Admitting: *Deleted

## 2014-10-05 ENCOUNTER — Inpatient Hospital Stay (HOSPITAL_COMMUNITY): Payer: Medicare Other | Admitting: Anesthesiology

## 2014-10-05 ENCOUNTER — Encounter (HOSPITAL_COMMUNITY)
Admission: RE | Disposition: A | Discharge status: Home / Self Care | Payer: Self-pay | Source: Ambulatory Visit | Attending: Orthopedic Surgery

## 2014-10-05 ENCOUNTER — Inpatient Hospital Stay (HOSPITAL_COMMUNITY)
Admission: RE | Admit: 2014-10-05 | Discharge: 2014-10-06 | DRG: 470 | Disposition: A | Discharge status: Home / Self Care | Payer: Medicare Other | Source: Ambulatory Visit | Attending: Orthopedic Surgery | Admitting: Orthopedic Surgery

## 2014-10-05 DIAGNOSIS — M659 Synovitis and tenosynovitis, unspecified: Secondary | ICD-10-CM | POA: Diagnosis present

## 2014-10-05 DIAGNOSIS — Z91013 Allergy to seafood: Secondary | ICD-10-CM

## 2014-10-05 DIAGNOSIS — M1711 Unilateral primary osteoarthritis, right knee: Secondary | ICD-10-CM | POA: Diagnosis present

## 2014-10-05 DIAGNOSIS — Z87442 Personal history of urinary calculi: Secondary | ICD-10-CM

## 2014-10-05 DIAGNOSIS — M810 Age-related osteoporosis without current pathological fracture: Secondary | ICD-10-CM | POA: Diagnosis present

## 2014-10-05 DIAGNOSIS — Z8673 Personal history of transient ischemic attack (TIA), and cerebral infarction without residual deficits: Secondary | ICD-10-CM | POA: Diagnosis not present

## 2014-10-05 DIAGNOSIS — Z96659 Presence of unspecified artificial knee joint: Secondary | ICD-10-CM

## 2014-10-05 DIAGNOSIS — E785 Hyperlipidemia, unspecified: Secondary | ICD-10-CM | POA: Diagnosis present

## 2014-10-05 DIAGNOSIS — E119 Type 2 diabetes mellitus without complications: Secondary | ICD-10-CM | POA: Diagnosis present

## 2014-10-05 DIAGNOSIS — Z85828 Personal history of other malignant neoplasm of skin: Secondary | ICD-10-CM | POA: Diagnosis not present

## 2014-10-05 DIAGNOSIS — Z96651 Presence of right artificial knee joint: Secondary | ICD-10-CM

## 2014-10-05 DIAGNOSIS — Z90710 Acquired absence of both cervix and uterus: Secondary | ICD-10-CM

## 2014-10-05 DIAGNOSIS — I10 Essential (primary) hypertension: Secondary | ICD-10-CM | POA: Diagnosis present

## 2014-10-05 DIAGNOSIS — M25561 Pain in right knee: Secondary | ICD-10-CM | POA: Diagnosis present

## 2014-10-05 DIAGNOSIS — Z96619 Presence of unspecified artificial shoulder joint: Secondary | ICD-10-CM | POA: Diagnosis present

## 2014-10-05 HISTORY — PX: TOTAL KNEE ARTHROPLASTY: SHX125

## 2014-10-05 LAB — TYPE AND SCREEN
ABO/RH(D): AB POS
Antibody Screen: NEGATIVE

## 2014-10-05 LAB — GLUCOSE, CAPILLARY
Glucose-Capillary: 102 mg/dL — ABNORMAL HIGH (ref 70–99)
Glucose-Capillary: 103 mg/dL — ABNORMAL HIGH (ref 70–99)
Glucose-Capillary: 192 mg/dL — ABNORMAL HIGH (ref 70–99)
Glucose-Capillary: 228 mg/dL — ABNORMAL HIGH (ref 70–99)

## 2014-10-05 SURGERY — ARTHROPLASTY, KNEE, TOTAL
Anesthesia: Spinal | Site: Knee | Laterality: Right

## 2014-10-05 MED ORDER — COLESEVELAM HCL 625 MG PO TABS
1875.0000 mg | ORAL_TABLET | Freq: Two times a day (BID) | ORAL | Status: DC
  Administered 2014-10-05 – 2014-10-06 (×2): 1875 mg via ORAL
  Filled 2014-10-05 (×4): qty 3

## 2014-10-05 MED ORDER — DEXAMETHASONE SODIUM PHOSPHATE 10 MG/ML IJ SOLN
10.0000 mg | Freq: Once | INTRAMUSCULAR | Status: AC
  Administered 2014-10-05: 10 mg via INTRAVENOUS

## 2014-10-05 MED ORDER — METOCLOPRAMIDE HCL 5 MG/ML IJ SOLN: 5.0000 mg | Freq: Three times a day (TID) | INTRAMUSCULAR | Status: DC | PRN

## 2014-10-05 MED ORDER — MIDAZOLAM HCL 5 MG/5ML IJ SOLN
INTRAMUSCULAR | Status: DC | PRN
  Administered 2014-10-05: 2 mg via INTRAVENOUS

## 2014-10-05 MED ORDER — CLOPIDOGREL BISULFATE 75 MG PO TABS
75.0000 mg | ORAL_TABLET | Freq: Every day | ORAL | Status: DC
  Administered 2014-10-05 – 2014-10-06 (×2): 75 mg via ORAL
  Filled 2014-10-05 (×3): qty 1

## 2014-10-05 MED ORDER — ASPIRIN 325 MG PO TABS
325.0000 mg | ORAL_TABLET | Freq: Every day | ORAL | Status: DC
  Administered 2014-10-05 – 2014-10-06 (×2): 325 mg via ORAL
  Filled 2014-10-05 (×2): qty 1

## 2014-10-05 MED ORDER — SODIUM CHLORIDE 0.9 % IR SOLN
Status: DC | PRN
  Administered 2014-10-05: 1000 mL

## 2014-10-05 MED ORDER — METHOCARBAMOL 1000 MG/10ML IJ SOLN
500.0000 mg | Freq: Four times a day (QID) | INTRAMUSCULAR | Status: DC | PRN
  Administered 2014-10-05: 500 mg via INTRAVENOUS
  Filled 2014-10-05 (×2): qty 5

## 2014-10-05 MED ORDER — LACTATED RINGERS IV SOLN
INTRAVENOUS | Status: DC
  Administered 2014-10-05 (×2): via INTRAVENOUS
  Administered 2014-10-05: 1000 mL via INTRAVENOUS

## 2014-10-05 MED ORDER — MENTHOL 3 MG MT LOZG
1.0000 | LOZENGE | OROMUCOSAL | Status: DC | PRN
  Filled 2014-10-05: qty 9

## 2014-10-05 MED ORDER — CEFAZOLIN SODIUM-DEXTROSE 2-3 GM-% IV SOLR
2.0000 g | Freq: Four times a day (QID) | INTRAVENOUS | Status: AC
  Administered 2014-10-05 (×2): 2 g via INTRAVENOUS
  Filled 2014-10-05 (×2): qty 50

## 2014-10-05 MED ORDER — POLYETHYLENE GLYCOL 3350 17 G PO PACK
17.0000 g | PACK | Freq: Two times a day (BID) | ORAL | Status: DC
  Administered 2014-10-05 – 2014-10-06 (×2): 17 g via ORAL

## 2014-10-05 MED ORDER — PROPOFOL 10 MG/ML IV BOLUS
INTRAVENOUS | Status: DC | PRN
  Administered 2014-10-05: 20 mg via INTRAVENOUS

## 2014-10-05 MED ORDER — BUPIVACAINE-EPINEPHRINE (PF) 0.25% -1:200000 IJ SOLN
INTRAMUSCULAR | Status: DC | PRN
  Administered 2014-10-05: 30 mL

## 2014-10-05 MED ORDER — KETOROLAC TROMETHAMINE 30 MG/ML IJ SOLN
INTRAMUSCULAR | Status: DC | PRN
  Administered 2014-10-05: 30 mg via INTRAVENOUS

## 2014-10-05 MED ORDER — HYDROMORPHONE HCL 1 MG/ML IJ SOLN
0.5000 mg | INTRAMUSCULAR | Status: DC | PRN
  Administered 2014-10-05: 1 mg via INTRAVENOUS
  Filled 2014-10-05: qty 1

## 2014-10-05 MED ORDER — BUPIVACAINE-EPINEPHRINE (PF) 0.25% -1:200000 IJ SOLN
INTRAMUSCULAR | Status: AC
  Filled 2014-10-05: qty 30

## 2014-10-05 MED ORDER — DEXAMETHASONE SODIUM PHOSPHATE 10 MG/ML IJ SOLN
10.0000 mg | Freq: Once | INTRAMUSCULAR | Status: AC
  Administered 2014-10-06: 10 mg via INTRAVENOUS
  Filled 2014-10-05: qty 1

## 2014-10-05 MED ORDER — SALINE SPRAY 0.65 % NA SOLN
1.0000 | Freq: Every day | NASAL | Status: DC | PRN
  Filled 2014-10-05: qty 44

## 2014-10-05 MED ORDER — SODIUM CHLORIDE 0.9 % IJ SOLN
INTRAMUSCULAR | Status: AC
  Filled 2014-10-05: qty 50

## 2014-10-05 MED ORDER — MEPERIDINE HCL 50 MG/ML IJ SOLN: 6.2500 mg | INTRAMUSCULAR | Status: DC | PRN

## 2014-10-05 MED ORDER — GLYBURIDE MICRONIZED 3 MG PO TABS
3.0000 mg | ORAL_TABLET | Freq: Every day | ORAL | Status: DC
  Administered 2014-10-06: 3 mg via ORAL
  Filled 2014-10-05 (×3): qty 1

## 2014-10-05 MED ORDER — ROSUVASTATIN CALCIUM 10 MG PO TABS
10.0000 mg | ORAL_TABLET | Freq: Every day | ORAL | Status: DC
  Administered 2014-10-05: 10 mg via ORAL
  Filled 2014-10-05 (×2): qty 1

## 2014-10-05 MED ORDER — HYDROMORPHONE HCL 1 MG/ML IJ SOLN: 0.2500 mg | INTRAMUSCULAR | Status: DC | PRN

## 2014-10-05 MED ORDER — BUPIVACAINE IN DEXTROSE 0.75-8.25 % IT SOLN
INTRATHECAL | Status: DC | PRN
  Administered 2014-10-05: 1.8 mL via INTRATHECAL

## 2014-10-05 MED ORDER — SODIUM CHLORIDE 0.9 % IJ SOLN
INTRAMUSCULAR | Status: AC
  Filled 2014-10-05: qty 10

## 2014-10-05 MED ORDER — KETOROLAC TROMETHAMINE 30 MG/ML IJ SOLN
INTRAMUSCULAR | Status: AC
  Filled 2014-10-05: qty 1

## 2014-10-05 MED ORDER — PROPOFOL INFUSION 10 MG/ML OPTIME
INTRAVENOUS | Status: DC | PRN
  Administered 2014-10-05: 50 ug/kg/min via INTRAVENOUS

## 2014-10-05 MED ORDER — HYDROCHLOROTHIAZIDE 25 MG PO TABS
25.0000 mg | ORAL_TABLET | Freq: Every morning | ORAL | Status: DC
  Administered 2014-10-06: 25 mg via ORAL
  Filled 2014-10-05 (×2): qty 1

## 2014-10-05 MED ORDER — AMLODIPINE BESYLATE 10 MG PO TABS
10.0000 mg | ORAL_TABLET | Freq: Every morning | ORAL | Status: DC
  Administered 2014-10-06: 10 mg via ORAL
  Filled 2014-10-05: qty 1

## 2014-10-05 MED ORDER — CEFAZOLIN SODIUM-DEXTROSE 2-3 GM-% IV SOLR
2.0000 g | INTRAVENOUS | Status: AC
  Administered 2014-10-05: 2 g via INTRAVENOUS

## 2014-10-05 MED ORDER — EPHEDRINE SULFATE 50 MG/ML IJ SOLN
INTRAMUSCULAR | Status: AC
  Filled 2014-10-05: qty 1

## 2014-10-05 MED ORDER — PHENYLEPHRINE HCL 10 MG/ML IJ SOLN
INTRAMUSCULAR | Status: DC | PRN
  Administered 2014-10-05: 40 ug via INTRAVENOUS
  Administered 2014-10-05: 20 ug via INTRAVENOUS
  Administered 2014-10-05: 40 ug via INTRAVENOUS
  Administered 2014-10-05 (×2): 20 ug via INTRAVENOUS

## 2014-10-05 MED ORDER — TRANEXAMIC ACID 100 MG/ML IV SOLN
2000.0000 mg | Freq: Once | INTRAVENOUS | Status: DC
  Filled 2014-10-05: qty 20

## 2014-10-05 MED ORDER — CHLORHEXIDINE GLUCONATE 4 % EX LIQD: 60.0000 mL | Freq: Once | CUTANEOUS | Status: DC

## 2014-10-05 MED ORDER — OXYCODONE HCL 5 MG PO TABS
5.0000 mg | ORAL_TABLET | ORAL | Status: DC
  Administered 2014-10-05 – 2014-10-06 (×6): 10 mg via ORAL
  Filled 2014-10-05 (×6): qty 2

## 2014-10-05 MED ORDER — 0.9 % SODIUM CHLORIDE (POUR BTL) OPTIME
TOPICAL | Status: DC | PRN
  Administered 2014-10-05: 1000 mL

## 2014-10-05 MED ORDER — DIPHENHYDRAMINE HCL 25 MG PO CAPS: 25.0000 mg | ORAL_CAPSULE | Freq: Four times a day (QID) | ORAL | Status: DC | PRN

## 2014-10-05 MED ORDER — CEFAZOLIN SODIUM-DEXTROSE 2-3 GM-% IV SOLR
INTRAVENOUS | Status: AC
  Filled 2014-10-05: qty 50

## 2014-10-05 MED ORDER — TOBRAMYCIN SULFATE 1.2 G IJ SOLR
INTRAMUSCULAR | Status: DC | PRN
  Administered 2014-10-05: 1.2 g

## 2014-10-05 MED ORDER — PROMETHAZINE HCL 25 MG/ML IJ SOLN: 6.2500 mg | INTRAMUSCULAR | Status: DC | PRN

## 2014-10-05 MED ORDER — METHOCARBAMOL 500 MG PO TABS
500.0000 mg | ORAL_TABLET | Freq: Four times a day (QID) | ORAL | Status: DC | PRN
  Administered 2014-10-05 – 2014-10-06 (×3): 500 mg via ORAL
  Filled 2014-10-05 (×3): qty 1

## 2014-10-05 MED ORDER — ALUM & MAG HYDROXIDE-SIMETH 200-200-20 MG/5ML PO SUSP: 30.0000 mL | ORAL | Status: DC | PRN

## 2014-10-05 MED ORDER — TOBRAMYCIN SULFATE 1.2 G IJ SOLR
INTRAMUSCULAR | Status: AC
  Filled 2014-10-05: qty 1.2

## 2014-10-05 MED ORDER — LIDOCAINE HCL (CARDIAC) 20 MG/ML IV SOLN
INTRAVENOUS | Status: DC | PRN
  Administered 2014-10-05: 40 mg via INTRAVENOUS

## 2014-10-05 MED ORDER — PROPOFOL 10 MG/ML IV BOLUS
INTRAVENOUS | Status: AC
  Filled 2014-10-05: qty 20

## 2014-10-05 MED ORDER — MIDAZOLAM HCL 2 MG/2ML IJ SOLN
INTRAMUSCULAR | Status: AC
  Filled 2014-10-05: qty 2

## 2014-10-05 MED ORDER — MAGNESIUM CITRATE PO SOLN: 1.0000 | Freq: Once | ORAL | Status: AC | PRN

## 2014-10-05 MED ORDER — SODIUM CHLORIDE 0.9 % IJ SOLN
INTRAMUSCULAR | Status: DC | PRN
  Administered 2014-10-05: 30 mL via INTRAVENOUS

## 2014-10-05 MED ORDER — ONDANSETRON HCL 4 MG/2ML IJ SOLN
INTRAMUSCULAR | Status: DC | PRN
  Administered 2014-10-05: 4 mg via INTRAVENOUS

## 2014-10-05 MED ORDER — CELECOXIB 200 MG PO CAPS
200.0000 mg | ORAL_CAPSULE | Freq: Two times a day (BID) | ORAL | Status: DC
  Administered 2014-10-05 – 2014-10-06 (×3): 200 mg via ORAL
  Filled 2014-10-05 (×4): qty 1

## 2014-10-05 MED ORDER — BISACODYL 10 MG RE SUPP: 10.0000 mg | Freq: Every day | RECTAL | Status: DC | PRN

## 2014-10-05 MED ORDER — AQUAPHOR EX OINT
1.0000 "application " | TOPICAL_OINTMENT | Freq: Two times a day (BID) | CUTANEOUS | Status: DC | PRN
  Filled 2014-10-05: qty 50

## 2014-10-05 MED ORDER — METOCLOPRAMIDE HCL 10 MG PO TABS: 5.0000 mg | ORAL_TABLET | Freq: Three times a day (TID) | ORAL | Status: DC | PRN

## 2014-10-05 MED ORDER — FENTANYL CITRATE 0.05 MG/ML IJ SOLN
INTRAMUSCULAR | Status: AC
  Filled 2014-10-05: qty 2

## 2014-10-05 MED ORDER — TRANEXAMIC ACID 100 MG/ML IV SOLN
2000.0000 mg | INTRAVENOUS | Status: DC | PRN
  Administered 2014-10-05: 2000 mg via TOPICAL

## 2014-10-05 MED ORDER — METFORMIN HCL 500 MG PO TABS
1000.0000 mg | ORAL_TABLET | Freq: Two times a day (BID) | ORAL | Status: DC
  Administered 2014-10-05 – 2014-10-06 (×2): 1000 mg via ORAL
  Filled 2014-10-05 (×4): qty 2

## 2014-10-05 MED ORDER — DOCUSATE SODIUM 100 MG PO CAPS
100.0000 mg | ORAL_CAPSULE | Freq: Two times a day (BID) | ORAL | Status: DC
  Administered 2014-10-05 – 2014-10-06 (×3): 100 mg via ORAL

## 2014-10-05 MED ORDER — PHENOL 1.4 % MT LIQD
1.0000 | OROMUCOSAL | Status: DC | PRN
  Filled 2014-10-05: qty 177

## 2014-10-05 MED ORDER — SODIUM CHLORIDE 0.9 % IV SOLN
INTRAVENOUS | Status: DC
  Administered 2014-10-06: 02:00:00 via INTRAVENOUS
  Filled 2014-10-05 (×5): qty 1000

## 2014-10-05 MED ORDER — FENTANYL CITRATE 0.05 MG/ML IJ SOLN
INTRAMUSCULAR | Status: DC | PRN
  Administered 2014-10-05 (×2): 50 ug via INTRAVENOUS

## 2014-10-05 MED ORDER — ONDANSETRON HCL 4 MG/2ML IJ SOLN: 4.0000 mg | Freq: Four times a day (QID) | INTRAMUSCULAR | Status: DC | PRN

## 2014-10-05 MED ORDER — INSULIN ASPART 100 UNIT/ML ~~LOC~~ SOLN
0.0000 [IU] | Freq: Three times a day (TID) | SUBCUTANEOUS | Status: DC
Start: 2014-10-05 — End: 2014-10-06
  Administered 2014-10-05: 3 [IU] via SUBCUTANEOUS
  Administered 2014-10-06: 2 [IU] via SUBCUTANEOUS
  Administered 2014-10-06: 5 [IU] via SUBCUTANEOUS

## 2014-10-05 MED ORDER — FERROUS SULFATE 325 (65 FE) MG PO TABS
325.0000 mg | ORAL_TABLET | Freq: Three times a day (TID) | ORAL | Status: DC
  Administered 2014-10-05 – 2014-10-06 (×4): 325 mg via ORAL
  Filled 2014-10-05 (×6): qty 1

## 2014-10-05 MED ORDER — ONDANSETRON HCL 4 MG PO TABS: 4.0000 mg | ORAL_TABLET | Freq: Four times a day (QID) | ORAL | Status: DC | PRN

## 2014-10-05 SURGICAL SUPPLY — 55 items
ADH SKN CLS APL DERMABOND .7 (GAUZE/BANDAGES/DRESSINGS) ×1
BAG SPEC THK2 15X12 ZIP CLS (MISCELLANEOUS)
BAG ZIPLOCK 12X15 (MISCELLANEOUS) IMPLANT
BANDAGE ELASTIC 6 VELCRO ST LF (GAUZE/BANDAGES/DRESSINGS) ×2 IMPLANT
BANDAGE ESMARK 6X9 LF (GAUZE/BANDAGES/DRESSINGS) ×1 IMPLANT
BLADE SAW SGTL 13.0X1.19X90.0M (BLADE) ×2 IMPLANT
BNDG CMPR 9X6 STRL LF SNTH (GAUZE/BANDAGES/DRESSINGS) ×1
BNDG ESMARK 6X9 LF (GAUZE/BANDAGES/DRESSINGS) ×2
BOWL SMART MIX CTS (DISPOSABLE) ×2 IMPLANT
CAPT KNEE TOTAL 3 ATTUNE ×1 IMPLANT
CEMENT HV SMART SET (Cement) ×2 IMPLANT
CUFF TOURN SGL QUICK 34 (TOURNIQUET CUFF) ×2
CUFF TRNQT CYL 34X4X40X1 (TOURNIQUET CUFF) ×1 IMPLANT
DECANTER SPIKE VIAL GLASS SM (MISCELLANEOUS) ×2 IMPLANT
DERMABOND ADVANCED (GAUZE/BANDAGES/DRESSINGS) ×1
DERMABOND ADVANCED .7 DNX12 (GAUZE/BANDAGES/DRESSINGS) IMPLANT
DRAPE EXTREMITY T 121X128X90 (DRAPE) ×2 IMPLANT
DRAPE POUCH INSTRU U-SHP 10X18 (DRAPES) ×2 IMPLANT
DRAPE U-SHAPE 47X51 STRL (DRAPES) ×2 IMPLANT
DRSG AQUACEL AG ADV 3.5X10 (GAUZE/BANDAGES/DRESSINGS) ×2 IMPLANT
DURAPREP 26ML APPLICATOR (WOUND CARE) ×4 IMPLANT
ELECT REM PT RETURN 9FT ADLT (ELECTROSURGICAL) ×2
ELECTRODE REM PT RTRN 9FT ADLT (ELECTROSURGICAL) ×1 IMPLANT
FACESHIELD WRAPAROUND (MASK) ×10 IMPLANT
FACESHIELD WRAPAROUND OR TEAM (MASK) ×5 IMPLANT
GLOVE BIOGEL PI IND STRL 7.5 (GLOVE) ×1 IMPLANT
GLOVE BIOGEL PI IND STRL 8.5 (GLOVE) ×1 IMPLANT
GLOVE BIOGEL PI INDICATOR 7.5 (GLOVE) ×1
GLOVE BIOGEL PI INDICATOR 8.5 (GLOVE) ×1
GLOVE ECLIPSE 8.0 STRL XLNG CF (GLOVE) ×2 IMPLANT
GLOVE ORTHO TXT STRL SZ7.5 (GLOVE) ×4 IMPLANT
GOWN SPEC L3 XXLG W/TWL (GOWN DISPOSABLE) ×2 IMPLANT
GOWN STRL REUS W/TWL LRG LVL3 (GOWN DISPOSABLE) ×5 IMPLANT
HANDPIECE INTERPULSE COAX TIP (DISPOSABLE) ×2
KIT BASIN OR (CUSTOM PROCEDURE TRAY) ×2 IMPLANT
LIQUID BAND (GAUZE/BANDAGES/DRESSINGS) ×2 IMPLANT
MANIFOLD NEPTUNE II (INSTRUMENTS) ×2 IMPLANT
NDL SAFETY ECLIPSE 18X1.5 (NEEDLE) ×1 IMPLANT
NEEDLE HYPO 18GX1.5 SHARP (NEEDLE) ×2
PACK TOTAL JOINT (CUSTOM PROCEDURE TRAY) ×2 IMPLANT
POSITIONER SURGICAL ARM (MISCELLANEOUS) ×2 IMPLANT
SET HNDPC FAN SPRY TIP SCT (DISPOSABLE) ×1 IMPLANT
SET PAD KNEE POSITIONER (MISCELLANEOUS) ×2 IMPLANT
SUCTION FRAZIER 12FR DISP (SUCTIONS) ×2 IMPLANT
SUT MNCRL AB 4-0 PS2 18 (SUTURE) ×2 IMPLANT
SUT VIC AB 1 CT1 36 (SUTURE) ×2 IMPLANT
SUT VIC AB 2-0 CT1 27 (SUTURE) ×6
SUT VIC AB 2-0 CT1 TAPERPNT 27 (SUTURE) ×3 IMPLANT
SUT VLOC 180 0 24IN GS25 (SUTURE) ×2 IMPLANT
SYR 50ML LL SCALE MARK (SYRINGE) ×2 IMPLANT
TOWEL OR 17X26 10 PK STRL BLUE (TOWEL DISPOSABLE) ×2 IMPLANT
TOWEL OR NON WOVEN STRL DISP B (DISPOSABLE) IMPLANT
TRAY FOLEY CATH 14FRSI W/METER (CATHETERS) ×2 IMPLANT
WATER STERILE IRR 1500ML POUR (IV SOLUTION) ×2 IMPLANT
WRAP KNEE MAXI GEL POST OP (GAUZE/BANDAGES/DRESSINGS) ×2 IMPLANT

## 2014-10-05 NOTE — Anesthesia Procedure Notes (Signed)
Spinal Patient location during procedure: OR Start time: 10/05/2014 10:05 AM End time: 10/05/2014 10:10 AM Staffing Anesthesiologist: Nickie Retort Resident/CRNA: Darlys Gales R Performed by: resident/CRNA  Preanesthetic Checklist Completed: patient identified, site marked, surgical consent, pre-op evaluation, timeout performed, IV checked, risks and benefits discussed and monitors and equipment checked Spinal Block Patient position: sitting Prep: Betadine Patient monitoring: heart rate, cardiac monitor, continuous pulse ox and blood pressure Approach: midline Location: L3-4 Injection technique: single-shot Needle Needle type: Spinocan  Needle gauge: 24 G Needle length: 9 cm Needle insertion depth: 8 cm Assessment Sensory level: T6

## 2014-10-05 NOTE — Op Note (Signed)
NAME:  Alexis Hopkins                      MEDICAL RECORD NO.:  [id number]                             FACILITY:  [facility]      PHYSICIAN:  Pietro Cassis. Alvan Dame, M.D.  DATE OF BIRTH:  07-25-1946      DATE OF PROCEDURE:  10/05/2014                                     OPERATIVE REPORT         PREOPERATIVE DIAGNOSIS:  Right knee osteoarthritis.      POSTOPERATIVE DIAGNOSIS:  Right knee osteoarthritis.      FINDINGS:  The patient was noted to have complete loss of cartilage and   bone-on-bone arthritis with associated osteophytes in the lateral and patellofemoral compartments of   the knee with a significant synovitis and associated effusion.      PROCEDURE:  Right total knee replacement.      COMPONENTS USED:  DePuy Attune rotating platform posterior stabilized knee   system, a size 4N femur, 4 tibia, size 7 mm AOX PS insert, and 47mm Anatomic patellar   button.      SURGEON:  Pietro Cassis. Alvan Dame, M.D.      ASSISTANT:  Danae Orleans, PA-C.      ANESTHESIA:  Spinal.      SPECIMENS:  None.      COMPLICATION:  None.      DRAINS:  None.  EBL: <100cc      TOURNIQUET TIME:   Total Tourniquet Time Documented: Thigh (Right) - 34 minutes Total: Thigh (Right) - 34 minutes  .      The patient was stable to the recovery room.      INDICATION FOR PROCEDURE:  Alexis Hopkins is a 69 y.o. female patient of   mine.  The patient had been seen, evaluated, and treated conservatively in the   office with medication, activity modification, and injections.  The patient had   radiographic changes of bone-on-bone arthritis with endplate sclerosis and osteophytes noted.      The patient failed conservative measures including medication, injections, and activity modification, and at this point was ready for more definitive measures.   Based on the radiographic changes and failed conservative measures, the patient   decided to proceed with total knee replacement.  Risks of infection,   DVT,  component failure, need for revision surgery, postop course, and   expectations were all   discussed and reviewed.  Consent was obtained for benefit of pain   relief.      PROCEDURE IN DETAIL:  The patient was brought to the operative theater.   Once adequate anesthesia, preoperative antibiotics, 2 gm of Ancef, 1gm of Tranexamic Acid and 10mg  of Decadron, administered, the patient was positioned supine with the right thigh tourniquet placed.  The  right lower extremity was prepped and draped in sterile fashion.  A time-   out was performed identifying the patient, planned procedure, and   extremity.      The right lower extremity was placed in the [facility] leg holder.  The leg was   exsanguinated, tourniquet elevated to 250 mmHg.  A midline incision was   made followed by  median parapatellar arthrotomy.  Following initial   exposure, attention was first directed to the patella.  Precut   measurement was noted to be 23 mm.  I resected down to 14 mm and used a   35 patellar button to restore patellar height as well as cover the cut   surface.      The lug holes were drilled and a metal shim was placed to protect the   patella from retractors and saw blades.      At this point, attention was now directed to the femur.  The femoral   canal was opened with a drill, irrigated to try to prevent fat emboli.  An   intramedullary rod was passed at 5 degrees valgus, 9 mm of bone was   resected off the distal femur.  Following this resection, the tibia was   subluxated anteriorly.  Using the extramedullary guide, 2 mm of bone was resected off   the proximal lateral tibia.  We confirmed the gap would be   stable medially and laterally with a 5-6 mm insert as well as confirmed   the cut was perpendicular in the coronal plane, checking with an alignment rod.      Once this was done, I sized the femur to be a size 4 in the anterior-   posterior dimension, chose a narrow component based on medial and    lateral dimension.  The size 4 rotation block was then pinned in   position anterior referenced using the tensioning device to set rotation and best match the flexion and extension gaps.  The   anterior, posterior, and  chamfer cuts were made without difficulty nor   notching making certain that I was along the anterior cortex to help   with flexion gap stability.      The final box cut was made off the lateral aspect of distal femur.      At this point, the tibia was sized to be a size 4, the size 4 tray was   then pinned in position through the medial third of the tubercle,   drilled, and keel punched.  Trial reduction was now carried with a 4 femur,  4 tibia, a 6 then 7 mm insert, and the 35 patella botton.  The knee was brought to   extension, full extension with good flexion stability with the patella   tracking through the trochlea without application of pressure.  Given   all these findings, I drilled for the femoral lug holes and then the trial components removed.  Final components were   opened and cement was mixed.  The knee was irrigated with normal saline   solution and pulse lavage.  The synovial lining was   then injected with 30cc of 0.25% Marcaine with epinephrine and 1 cc of Toradol plus 30cc of NS for a  total of 61 cc.      The knee was irrigated.  Final implants were then cemented onto clean and   dried cut surfaces of bone with the knee brought to extension with a 7 mm trial insert.      Once the cement had fully cured, the excess cement was removed   throughout the knee.  I confirmed I was satisfied with the range of   motion and stability, and the final size 7 mm AOX PS insert was chosen.  It was   placed into the knee.      The tourniquet had been let down  at 34 minutes.  No significant   hemostasis required.  The   extensor mechanism was then reapproximated using #1 Vicryl and #0 V-lock sutures with the knee   in flexion.  The   remaining wound was closed with  2-0 Vicryl and running 4-0 Monocryl.   The knee was cleaned, dried, dressed sterilely using Dermabond and   Aquacel dressing.  The patient was then   brought to recovery room in stable condition, tolerating the procedure   well.   Please note that Physician Assistant, Danae Orleans, PA-C, was present for the entirety of the case, and was utilized for pre-operative positioning, peri-operative retractor management, general facilitation of the procedure.  He was also utilized for primary wound closure at the end of the case.              Pietro Cassis Alvan Dame, M.D.    10/05/2014 11:33 AM

## 2014-10-05 NOTE — Anesthesia Preprocedure Evaluation (Addendum)
Anesthesia Evaluation  Patient identified by MRN, date of birth, ID band Patient awake    Reviewed: Allergy & Precautions, H&P , NPO status , Patient's Chart, lab work & pertinent test results  Airway Mallampati: I  TM Distance: >3 FB Neck ROM: Full    Dental  (+) Dental Advisory Given, Teeth Intact   Pulmonary neg pulmonary ROS,          Cardiovascular hypertension, Pt. on medications negative cardio ROS      Neuro/Psych TIAnegative psych ROS   GI/Hepatic negative GI ROS, Neg liver ROS,   Endo/Other  diabetes, Well Controlled, Type 2, Oral Hypoglycemic Agents  Renal/GU negative Renal ROS     Musculoskeletal  (+) Arthritis -,   Abdominal (+) + obese,   Peds  Hematology negative hematology ROS (+)   Anesthesia Other Findings   Reproductive/Obstetrics negative OB ROS                            Anesthesia Physical  Anesthesia Plan  ASA: II  Anesthesia Plan: Spinal   Post-op Pain Management:    Induction:   Airway Management Planned:   Additional Equipment:   Intra-op Plan:   Post-operative Plan:   Informed Consent: I have reviewed the patients History and Physical, chart, labs and discussed the procedure including the risks, benefits and alternatives for the proposed anesthesia with the patient or authorized representative who has indicated his/her understanding and acceptance.   Dental advisory given  Plan Discussed with: CRNA  Anesthesia Plan Comments:         Anesthesia Quick Evaluation

## 2014-10-05 NOTE — Interval H&P Note (Signed)
History and Physical Interval Note:  10/05/2014 8:50 AM  Alexis Hopkins  has presented today for surgery, with the diagnosis of right knee osteoarthritis  The various methods of treatment have been discussed with the patient and family. After consideration of risks, benefits and other options for treatment, the patient has consented to  Procedure(s): RIGHT TOTAL KNEE ARTHROPLASTY (Right) as a surgical intervention .  The patient's history has been reviewed, patient examined, no change in status, stable for surgery.  I have reviewed the patient's chart and labs.  Questions were answered to the patient's satisfaction.     Mauri Pole

## 2014-10-05 NOTE — Evaluation (Signed)
Physical Therapy Evaluation Patient Details Name: Alexis Hopkins MRN: [id number] DOB: 18-Mar-1946 Today's Date: 10/05/2014   History of Present Illness  Pt is a 69 year old female s/p R TKA  Clinical Impression  Pt is s/p R TKA resulting in the deficits listed below (see PT Problem List).  Pt will benefit from skilled PT to increase their independence and safety with mobility to allow discharge to the venue listed below.  Pt able to tolerate short distance ambulation POD #0 and plans to d/c home with spouse.     Follow Up Recommendations Home health PT    Equipment Recommendations  Rolling walker with 5" wheels    Recommendations for Other Services       Precautions / Restrictions Precautions Precautions: Knee Restrictions Other Position/Activity Restrictions: WBAT      Mobility  Bed Mobility Overal bed mobility: Needs Assistance Bed Mobility: Supine to Sit     Supine to sit: Min assist     General bed mobility comments: support for R LE over bed  Transfers Overall transfer level: Needs assistance Equipment used: Rolling walker (2 wheeled) Transfers: Sit to/from Stand Sit to Stand: Min guard         General transfer comment: verbal cues for safe technique  Ambulation/Gait Ambulation/Gait assistance: Min guard Ambulation Distance (Feet): 80 Feet Assistive device: Rolling walker (2 wheeled) Gait Pattern/deviations: Step-to pattern;Antalgic;Trunk flexed;Decreased stance time - right     General Gait Details: verbal cues for sequence, step length, posture, RW distance  Stairs            Wheelchair Mobility    Modified Rankin (Stroke Patients Only)       Balance                                             Pertinent Vitals/Pain Pain Assessment: No/denies pain (premedicated)    Home Living Family/patient expects to be discharged to:: Private residence Living Arrangements: Spouse/significant other Available Help at  Discharge: Family Type of Home: House Home Access: Ramped entrance     Home Layout: One level Home Equipment: Crutches      Prior Function Level of Independence: Independent               Hand Dominance        Extremity/Trunk Assessment               Lower Extremity Assessment: RLE deficits/detail RLE Deficits / Details: good quad contraction, able to lift LE off bed       Communication   Communication: No difficulties  Cognition Arousal/Alertness: Awake/alert Behavior During Therapy: WFL for tasks assessed/performed Overall Cognitive Status: Within Functional Limits for tasks assessed                      General Comments      Exercises        Assessment/Plan    PT Assessment Patient needs continued PT services  PT Diagnosis Difficulty walking   PT Problem List Decreased strength;Decreased range of motion;Decreased mobility;Decreased knowledge of use of DME  PT Treatment Interventions Gait training;DME instruction;Patient/family education;Functional mobility training;Therapeutic activities;Therapeutic exercise   PT Goals (Current goals can be found in the Care Plan section) Acute Rehab PT Goals PT Goal Formulation: With patient Time For Goal Achievement: 10/09/14 Potential to Achieve Goals: Good    Frequency 7X/week  Barriers to discharge        Co-evaluation               End of Session   Activity Tolerance: Patient tolerated treatment well Patient left: in chair;with call bell/phone within reach;with family/visitor present Nurse Communication: Mobility status         Time: 6578-4696 PT Time Calculation (min) (ACUTE ONLY): 15 min   Charges:   PT Evaluation $Initial PT Evaluation Tier I: 1 Procedure PT Treatments $Gait Training: 8-22 mins   PT G Codes:        Kemper Hochman,KATHrine E 10/05/2014, 4:04 PM Carmelia Bake, PT, DPT 10/05/2014 Pager: [phone]

## 2014-10-05 NOTE — Interval H&P Note (Signed)
History and Physical Interval Note:  10/05/2014 8:50 AM  [name]  has presented today for surgery, with the diagnosis of right knee osteoarthritis  The various methods of treatment have been discussed with the patient and family. After consideration of risks, benefits and other options for treatment, the patient has consented to  Procedure(s): RIGHT TOTAL KNEE ARTHROPLASTY (Right) as a surgical intervention .  The patient's history has been reviewed, patient examined, no change in status, stable for surgery.  I have reviewed the patient's chart and labs.  Questions were answered to the patient's satisfaction.     Alexis Hopkins

## 2014-10-05 NOTE — Transfer of Care (Signed)
Immediate Anesthesia Transfer of Care Note  Patient: Alexis Hopkins  Procedure(s) Performed: Procedure(s) (LRB): RIGHT TOTAL KNEE ARTHROPLASTY (Right)  Patient Location: PACU  Anesthesia Type: Spinal  Level of Consciousness: awake, patient cooperative and responds to stimulation  Airway & Oxygen Therapy: Patient Spontanous Breathing and Patient connected to face mask oxgen  Post-op Assessment: Report given to PACU RN and Post -op Vital signs reviewed and stable  Post vital signs: Reviewed and stable  Complications: No apparent anesthesia complications

## 2014-10-05 NOTE — Anesthesia Postprocedure Evaluation (Signed)
Anesthesia Post Note  Patient: Alexis Hopkins  Procedure(s) Performed: Procedure(s) (LRB): RIGHT TOTAL KNEE ARTHROPLASTY (Right)  Anesthesia type: Spinal  Patient location: PACU  Post pain: Pain level controlled  Post assessment: Post-op Vital signs reviewed  Last Vitals: BP 130/64 mmHg  Pulse 81  Temp(Src) 36.6 C (Oral)  Resp 16  Ht 5\' 2"  (1.575 m)  Wt 196 lb (88.905 kg)  BMI 35.84 kg/m2  SpO2 96%  Post vital signs: Reviewed  Level of consciousness: sedated  Complications: No apparent anesthesia complications

## 2014-10-06 ENCOUNTER — Encounter (HOSPITAL_COMMUNITY): Payer: Self-pay | Admitting: Orthopedic Surgery

## 2014-10-06 LAB — CBC
HCT: 28.6 % — ABNORMAL LOW (ref 36.0–46.0)
Hemoglobin: 9.2 g/dL — ABNORMAL LOW (ref 12.0–15.0)
MCH: 26.3 pg (ref 26.0–34.0)
MCHC: 32.2 g/dL (ref 30.0–36.0)
MCV: 81.7 fL (ref 78.0–100.0)
PLATELETS: 297 10*3/uL (ref 150–400)
RBC: 3.5 MIL/uL — ABNORMAL LOW (ref 3.87–5.11)
RDW: 15.1 % (ref 11.5–15.5)
WBC: 12.5 10*3/uL — ABNORMAL HIGH (ref 4.0–10.5)

## 2014-10-06 LAB — BASIC METABOLIC PANEL
Anion gap: 5 (ref 5–15)
BUN: 20 mg/dL (ref 6–23)
CO2: 23 mmol/L (ref 19–32)
Calcium: 8.1 mg/dL — ABNORMAL LOW (ref 8.4–10.5)
Chloride: 109 mEq/L (ref 96–112)
Creatinine, Ser: 1.12 mg/dL — ABNORMAL HIGH (ref 0.50–1.10)
GFR, EST AFRICAN AMERICAN: 58 mL/min — AB (ref >90)
GFR, EST NON AFRICAN AMERICAN: 50 mL/min — AB (ref >90)
GLUCOSE: 192 mg/dL — AB (ref 70–99)
POTASSIUM: 4.8 mmol/L (ref 3.5–5.1)
Sodium: 137 mmol/L (ref 135–145)

## 2014-10-06 LAB — GLUCOSE, CAPILLARY: Glucose-Capillary: 149 mg/dL — ABNORMAL HIGH (ref 70–99)

## 2014-10-06 MED ORDER — TIZANIDINE HCL 4 MG PO TABS: 4.0000 mg | ORAL_TABLET | Freq: Four times a day (QID) | ORAL | Status: DC | PRN

## 2014-10-06 MED ORDER — FERROUS SULFATE 325 (65 FE) MG PO TABS
325.0000 mg | ORAL_TABLET | Freq: Three times a day (TID) | ORAL | Status: DC
Start: 2014-10-06 — End: 2015-03-10

## 2014-10-06 MED ORDER — DSS 100 MG PO CAPS: 100.0000 mg | ORAL_CAPSULE | Freq: Two times a day (BID) | ORAL | Status: DC

## 2014-10-06 MED ORDER — OXYCODONE HCL 5 MG PO TABS: 5.0000 mg | ORAL_TABLET | ORAL | Status: DC | PRN

## 2014-10-06 MED ORDER — POLYETHYLENE GLYCOL 3350 17 G PO PACK: 17.0000 g | PACK | Freq: Two times a day (BID) | ORAL | Status: DC

## 2014-10-06 NOTE — Evaluation (Signed)
Occupational Therapy Evaluation Patient Details Name: Alexis Hopkins MRN: [id number] DOB: 05-26-1946 Today's Date: 10/06/2014    History of Present Illness Pt is a 69 year old female s/p R TKA   Clinical Impression   Pt doing well and hopeful for d/c later today. Feel pt will be ok to d/c from OT standpoint with husband today. If here after today, can see to further progress ADL.     Follow Up Recommendations  No OT follow up;Supervision/Assistance - 24 hour    Equipment Recommendations  3 in 1 bedside comode    Recommendations for Other Services       Precautions / Restrictions Precautions Precautions: Knee Restrictions Weight Bearing Restrictions: No Other Position/Activity Restrictions: WBAT      Mobility Bed Mobility              Transfers Overall transfer level: Needs assistance Equipment used: Rolling walker (2 wheeled) Transfers: Sit to/from Stand Sit to Stand: Min guard         General transfer comment: verbal cues hand placement.    Balance                                            ADL Overall ADL's : Needs assistance/impaired Eating/Feeding: Independent;Sitting   Grooming: Wash/dry hands;Min guard;Standing   Upper Body Bathing: Set up;Sitting   Lower Body Bathing: Minimal assistance;Sit to/from stand   Upper Body Dressing : Set up;Sitting   Lower Body Dressing: Minimal assistance;Sit to/from stand   Toilet Transfer: Min guard;Ambulation;BSC;RW   Toileting- Water quality scientist and Hygiene: Min guard;Sit to/from stand         General ADL Comments: Pt's husband present. Pt in the bathroom with nursing assisting. OT assisted pt from bathroom back to chair. Min guard assist with walker to transfer off 3in1 and into recliner. She needs min cues to not move too quickly for safety and not turn the walker too much at one time. SHe will need a 3in1 and explained how to adjust. Pt's husband states he can assist with LB  dressing at home and they dont need AE. Reviewed sequence for LB dressing and to sit and start clothing over LEs. Pt has a tubseat and discussed sit and pivot Les around into tub option. Pt states this will work and husband can assist. Advised against the garden tub right now for pt.     Vision                     Perception     Praxis      Pertinent Vitals/Pain Pain Assessment: 0-10 Pain Score: 5  Pain Location: R knee Pain Descriptors / Indicators: Aching Pain Intervention(s): Repositioned;Ice applied     Hand Dominance     Extremity/Trunk Assessment Upper Extremity Assessment Upper Extremity Assessment: Overall WFL for tasks assessed           Communication Communication Communication: No difficulties   Cognition Arousal/Alertness: Awake/alert Behavior During Therapy: WFL for tasks assessed/performed Overall Cognitive Status: Within Functional Limits for tasks assessed                     General Comments       Exercises       Shoulder Instructions      Home Living Family/patient expects to be discharged to:: Private residence Living Arrangements: Spouse/significant other  Available Help at Discharge: Family Type of Home: House Home Access: Ramped entrance     Home Layout: One level     Bathroom Shower/Tub: Teacher, early years/pre: Standard     Home Equipment: Crutches          Prior Functioning/Environment Level of Independence: Independent             OT Diagnosis: Generalized weakness   OT Problem List: Decreased strength;Decreased knowledge of use of DME or AE   OT Treatment/Interventions: Self-care/ADL training;Patient/family education;Therapeutic activities;DME and/or AE instruction    OT Goals(Current goals can be found in the care plan section) Acute Rehab OT Goals Patient Stated Goal: home OT Goal Formulation: With patient Time For Goal Achievement: 10/13/14 Potential to Achieve Goals: Good  OT  Frequency: Min 2X/week   Barriers to D/C:            Co-evaluation              End of Session Equipment Utilized During Treatment: Rolling walker  Activity Tolerance: Patient tolerated treatment well Patient left: in chair;with call bell/phone within reach;with family/visitor present   Time: 1120-1145 OT Time Calculation (min): 25 min Charges:  OT General Charges $OT Visit: 1 Procedure OT Evaluation $Initial OT Evaluation Tier I: 1 Procedure OT Treatments $Therapeutic Activity: 8-22 mins G-Codes:    Jules Schick  [phone] 10/06/2014, 12:50 PM

## 2014-10-06 NOTE — Progress Notes (Signed)
Patient ID: Alexis Hopkins, female   DOB: 1946/01/13, 69 y.o.   MRN: [id number] Subjective: 1 Day Post-Op Procedure(s) (LRB): RIGHT TOTAL KNEE ARTHROPLASTY (Right)    Patient reports pain as mild.  Doing very well.  Ready to go home  Objective:   VITALS:   Filed Vitals:   10/06/14 0932  BP: 140/54  Pulse: 65  Temp: 98.5 F (36.9 C)  Resp: 16    Neurovascular intact Incision: dressing C/D/I  LABS  Recent Labs  10/06/14 0515  HGB 9.2*  HCT 28.6*  WBC 12.5*  PLT 297     Recent Labs  10/06/14 0515  NA 137  K 4.8  BUN 20  CREATININE 1.12*  GLUCOSE 192*    No results for input(s): LABPT, INR in the last 72 hours.   Assessment/Plan: 1 Day Post-Op Procedure(s) (LRB): RIGHT TOTAL KNEE ARTHROPLASTY (Right)   Advance diet Up with therapy Discharge home with home health today,ready to go home sooner than later

## 2014-10-06 NOTE — Progress Notes (Signed)
Physical Therapy Treatment Patient Details Name: Alexis Hopkins MRN: [id number] DOB: 1946-09-30 Today's Date: 10/06/2014    History of Present Illness Pt is a 69 year old female s/p R TKA    PT Comments    Pt nauseated and small amount of vomiting upon standing however states likely from just drinking coffee and denies further episodes.  Pt ambulated in hallway good distance and then performed LE exercises.  Pt provided with HEP handout.  Pt had no further questions and will likely d/c home later today.  Follow Up Recommendations  Home health PT     Equipment Recommendations  Rolling walker with 5" wheels    Recommendations for Other Services       Precautions / Restrictions Precautions Precautions: Knee Restrictions Weight Bearing Restrictions: No Other Position/Activity Restrictions: WBAT    Mobility  Bed Mobility Overal bed mobility: Needs Assistance Bed Mobility: Supine to Sit     Supine to sit: Supervision        Transfers Overall transfer level: Needs assistance Equipment used: Rolling walker (2 wheeled) Transfers: Sit to/from Stand Sit to Stand: Min guard         General transfer comment: verbal cues for safe technique including hand placement  Ambulation/Gait Ambulation/Gait assistance: Min guard Ambulation Distance (Feet): 240 Feet Assistive device: Rolling walker (2 wheeled) Gait Pattern/deviations: Step-to pattern;Antalgic;Trunk flexed;Decreased stance time - right     General Gait Details: verbal cues for step length, posture, RW distance   Stairs            Wheelchair Mobility    Modified Rankin (Stroke Patients Only)       Balance                                    Cognition Arousal/Alertness: Awake/alert Behavior During Therapy: WFL for tasks assessed/performed Overall Cognitive Status: Within Functional Limits for tasks assessed                      Exercises Total Joint Exercises Ankle  Circles/Pumps: AROM;Both;15 reps Quad Sets: AROM;Both;15 reps Towel Squeeze: AROM;Both;15 reps Short Arc Quad: AROM;Right;15 reps Heel Slides: AAROM;Right;15 reps Hip ABduction/ADduction: AAROM;Right;15 reps Straight Leg Raises: 15 reps;AAROM;Right    General Comments        Pertinent Vitals/Pain Pain Assessment: 0-10 Pain Score: 5  Pain Location: R knee Pain Descriptors / Indicators: Aching;Sore Pain Intervention(s): Limited activity within patient's tolerance;Monitored during session;Repositioned;Premedicated before session;Ice applied    Home Living                      Prior Function            PT Goals (current goals can now be found in the care plan section) Progress towards PT goals: Progressing toward goals    Frequency  7X/week    PT Plan Current plan remains appropriate    Co-evaluation             End of Session   Activity Tolerance: Patient tolerated treatment well Patient left: in chair;with call bell/phone within reach;with family/visitor present     Time: 5631-4970 PT Time Calculation (min) (ACUTE ONLY): 29 min  Charges:  $Gait Training: 8-22 mins $Therapeutic Exercise: 8-22 mins                    G Codes:      Alexis Hopkins,Alexis Hopkins  10/06/2014, 11:02 AM Alexis Hopkins, PT, DPT 10/06/2014 Pager: [phone]

## 2014-10-06 NOTE — Care Management Note (Signed)
    Page 1 of 2   10/06/2014     11:50:49 AM CARE MANAGEMENT NOTE 10/06/2014  Patient:  Grandstaff,Annikah P   Account Number:  [id number]  Date Initiated:  10/06/2014  Documentation initiated by:  JEFFRIES,SARAH  Subjective/Objective Assessment:   adm: RIGHT TOTAL KNEE ARTHROPLASTY (Right)     Action/Plan:   discharge planning   Anticipated DC Date:  10/06/2014   Anticipated DC Plan:  HOME W HOME HEALTH SERVICES      DC Planning Services  CM consult      PAC Choice  HOME HEALTH   Choice offered to / List presented to:  C-1 Patient   DME arranged  3-N-1  WALKER - ROLLING      DME agency  [facility]     HH arranged  HH-2 PT      HH agency  [facility]   Status of service:  Completed, signed off Medicare Important Message given?   (If response is "NO", the following Medicare IM given date fields will be blank) Date Medicare IM given:   Medicare IM given by:   Date Additional Medicare IM given:   Additional Medicare IM given by:    Discharge Disposition:  HOME W HOME HEALTH SERVICES  Per UR Regulation:    If discussed at Long Length of Stay Meetings, dates discussed:    Comments:  10/06/14 10:00 Cm met with pt in room to offer choice of home health agency.  Pt chooses [facility] to render HHPT.  Address and contact information verified with pt.  referral given to [facility] rep, Tim (on unit).  CM called [facility] rep, Lecretia to please deliver the 3n1 and rolling walker to room prior to discharge.  No other CM needs were communicated.  Sarah jeffries, BSN, CM [phone].   

## 2014-10-06 NOTE — Progress Notes (Signed)
Utilization review completed.  

## 2014-10-07 LAB — GLUCOSE, CAPILLARY: GLUCOSE-CAPILLARY: 207 mg/dL — AB (ref 70–99)

## 2014-10-08 NOTE — Discharge Summary (Signed)
Physician Discharge Summary  Patient ID: Alexis Hopkins MRN: [id number] DOB/AGE: 1946/07/27 69 y.o.  Admit date: 10/05/2014 Discharge date: 10/06/2014   Procedures:  Procedure(s) (LRB): RIGHT TOTAL KNEE ARTHROPLASTY (Right)  Attending Physician:  Dr. Paralee Cancel   Admission Diagnoses:   Right knee primary OA / pain  Discharge Diagnoses:  Principal Problem:   S/P right TKA Active Problems:   S/P knee replacement  Past Medical History  Diagnosis Date  . Hypertension   . Diabetes mellitus without complication   . TIA (transient ischemic attack) 1994    Takes Plavix  . History of kidney stones   . Hyperlipemia   . Arthritis   . Family history of adverse reaction to anesthesia     "mother always was deathly sick"  . Osteoporosis   . Hx of skin cancer, basal cell     HPI: Alexis Hopkins, 69 y.o. female, has a history of pain and functional disability in the right knee due to arthritis and has failed non-surgical conservative treatments for greater than 12 weeks to include NSAID's and/or analgesics, corticosteriod injections, viscosupplementation injections, use of assistive devices and activity modification. Onset of symptoms was gradual, starting >10 years ago with gradually worsening course since that time. The patient noted prior procedures on the knee to include arthroscopy on the right knee(s). Patient currently rates pain in the right knee(s) at 10 out of 10 with activity. Patient has night pain, worsening of pain with activity and weight bearing, pain that interferes with activities of daily living, pain with passive range of motion, crepitus and joint swelling. Patient has evidence of periarticular osteophytes and joint space narrowing by imaging studies. There is no active infection. Risks, benefits and expectations were discussed with the patient. Risks including but not limited to the risk of anesthesia, blood clots, nerve damage, blood vessel damage, failure of  the prosthesis, infection and up to and including death. Patient understand the risks, benefits and expectations and wishes to proceed with surgery.   PCP: Mayra Neer, MD   Discharged Condition: good  Hospital Course:  Patient underwent the above stated procedure on 10/05/2014. Patient tolerated the procedure well and brought to the recovery room in good condition and subsequently to the floor.  POD #1 BP: 140/54 ; Pulse: 65 ; Temp: 98.5 F (36.9 C) ; Resp: 16 Patient reports pain as mild. Doing very well. Ready to go home. Dorsiflexion/plantar flexion intact, incision: dressing C/D/I, no cellulitis present and compartment soft.   LABS  Basename    HGB  9.2  HCT  28.6    Discharge Exam: General appearance: alert, cooperative and no distress Extremities: Homans sign is negative, no sign of DVT, no edema, redness or tenderness in the calves or thighs and no ulcers, gangrene or trophic changes  Disposition: Home with follow up in 2 weeks   Follow-up Information    Follow up with [facility].   Why:  home health physical therapy   Contact information:   [address] [place] [address] [phone]       Follow up with [facility].   Why:  3n1 (commode) and rolling walker   Contact information:   [address] [place] [address] [phone]       Follow up with Mauri Pole, MD. Schedule an appointment as soon as possible for a visit in 2 weeks.   Specialty:  Orthopedic Surgery   Contact information:   [address] [place] [address] [phone]       Discharge Instructions    Call MD / Call 911    Complete by:  As directed   If you experience chest pain or shortness of breath, CALL 911 and be transported to the hospital emergency room.  If you develope a fever above 101 F, pus (white drainage) or increased drainage or redness at the wound, or calf pain, call your surgeon's office.      Change dressing    Complete by:  As directed   Maintain surgical dressing until follow up in the clinic. If the edges start to pull up, may reinforce with tape. If the dressing is no longer working, may remove and cover with gauze and tape, but must keep the area dry and clean.  Call with any questions or concerns.     Constipation Prevention    Complete by:  As directed   Drink plenty of fluids.  Prune juice may be helpful.  You may use a stool softener, such as Colace (over the counter) 100 mg twice a day.  Use MiraLax (over the counter) for constipation as needed.     Diet - low sodium heart healthy    Complete by:  As directed      Discharge instructions    Complete by:  As directed   Maintain surgical dressing until follow up in the clinic. If the edges start to pull up, may reinforce with tape. If the dressing is no longer working, may remove and cover with gauze and tape, but must keep the area dry and clean.  Follow up in 2 weeks at [facility]. Call with any questions or concerns.     Driving restrictions    Complete by:  As directed   No driving for 4 weeks     Increase activity slowly as tolerated    Complete by:  As directed      TED hose    Complete by:  As directed   Use stockings (TED hose) for 2 weeks on both leg(s).  You may remove them at night for sleeping.     Weight bearing as tolerated    Complete by:  As directed   Laterality:  right  Extremity:  Lower             Medication List    STOP taking these medications        methocarbamol 500 MG tablet  Commonly known as:  ROBAXIN     oxyCODONE-acetaminophen 5-325 MG per tablet  Commonly known as:  ROXICET      TAKE these medications        amLODipine 10 MG tablet  Commonly known as:  NORVASC  Take 10 mg by mouth every morning.     aspirin 325 MG tablet  Take 325 mg by mouth daily.     calcium-vitamin D 500-200 MG-UNIT per tablet  Commonly known as:  OSCAL WITH D  Take 1 tablet by mouth  every morning.     clopidogrel 75 MG tablet  Commonly known as:  PLAVIX  Take 75 mg by mouth daily with breakfast.     colesevelam 625 MG tablet  Commonly known as:  WELCHOL  Take 1,875 mg by mouth 2 (two) times daily with a meal.     DSS 100 MG Caps  Take 100 mg by mouth 2 (two) times daily.  Notes to Patient:  Colace-Stool softner  ferrous sulfate 325 (65 FE) MG tablet  Take 1 tablet (325 mg total) by mouth 3 (three) times daily after meals.     glyBURIDE micronized 3 MG tablet  Commonly known as:  GLYNASE  Take 3 mg by mouth daily with breakfast.     hydrochlorothiazide 25 MG tablet  Commonly known as:  HYDRODIURIL  Take 25 mg by mouth every morning.     lisinopril 40 MG tablet  Commonly known as:  PRINIVIL,ZESTRIL  Take 40 mg by mouth every morning.     metFORMIN 1000 MG tablet  Commonly known as:  GLUCOPHAGE  Take 1,000 mg by mouth 2 (two) times daily with a meal.     mineral oil-hydrophilic petrolatum ointment  Apply 1 application topically. Two to three times daily on face. (pt had a PTD therapy done on face 09/16/14)-- May alternate with Aveeno Healing Ointment.     multivitamin with minerals Tabs tablet  Take 1 tablet by mouth every morning.     OMEGA 3 PO  Take 2 tablets by mouth 2 (two) times daily. Omega XL     OSTEO BI-FLEX TRIPLE STRENGTH PO  Take 1 tablet by mouth 2 (two) times daily.     oxyCODONE 5 MG immediate release tablet  Commonly known as:  Oxy IR/ROXICODONE  Take 1-3 tablets (5-15 mg total) by mouth every 4 (four) hours as needed for severe pain.     polyethylene glycol packet  Commonly known as:  MIRALAX / GLYCOLAX  Take 17 g by mouth 2 (two) times daily.  Notes to Patient:  Mild laxative     promethazine 25 MG tablet  Commonly known as:  PHENERGAN  Take 1 tablet (25 mg total) by mouth every 6 (six) hours as needed for nausea or vomiting.     rosuvastatin 10 MG tablet  Commonly known as:  CRESTOR  Take 10 mg by mouth at bedtime.       sodium chloride 0.65 % Soln nasal spray  Commonly known as:  OCEAN  Place 1-2 sprays into both nostrils daily as needed for congestion (cold.).     tiZANidine 4 MG tablet  Commonly known as:  ZANAFLEX  Take 1 tablet (4 mg total) by mouth every 6 (six) hours as needed for muscle spasms.  Notes to Patient:  Muscle Relaxer         Signed: West Pugh. Kingslee Dowse   PA-C  10/08/2014, 8:20 AM

## 2014-10-26 ENCOUNTER — Ambulatory Visit: Payer: Medicare Other | Attending: Orthopedic Surgery | Admitting: Physical Therapy

## 2014-10-26 DIAGNOSIS — Z471 Aftercare following joint replacement surgery: Secondary | ICD-10-CM | POA: Diagnosis not present

## 2014-10-26 DIAGNOSIS — M25661 Stiffness of right knee, not elsewhere classified: Secondary | ICD-10-CM | POA: Diagnosis not present

## 2014-10-26 DIAGNOSIS — M25561 Pain in right knee: Secondary | ICD-10-CM | POA: Diagnosis not present

## 2014-10-29 ENCOUNTER — Ambulatory Visit: Payer: Medicare Other | Admitting: *Deleted

## 2014-11-02 ENCOUNTER — Ambulatory Visit: Payer: Medicare Other | Attending: Orthopedic Surgery | Admitting: *Deleted

## 2014-11-02 DIAGNOSIS — M25561 Pain in right knee: Secondary | ICD-10-CM | POA: Insufficient documentation

## 2014-11-02 DIAGNOSIS — Z471 Aftercare following joint replacement surgery: Secondary | ICD-10-CM | POA: Insufficient documentation

## 2014-11-02 DIAGNOSIS — M25661 Stiffness of right knee, not elsewhere classified: Secondary | ICD-10-CM | POA: Diagnosis not present

## 2014-11-04 ENCOUNTER — Ambulatory Visit: Payer: Medicare Other | Admitting: Physical Therapy

## 2014-11-04 DIAGNOSIS — Z471 Aftercare following joint replacement surgery: Secondary | ICD-10-CM | POA: Diagnosis not present

## 2014-11-09 ENCOUNTER — Ambulatory Visit: Payer: Medicare Other | Admitting: Physical Therapy

## 2014-11-09 DIAGNOSIS — Z471 Aftercare following joint replacement surgery: Secondary | ICD-10-CM | POA: Diagnosis not present

## 2014-11-12 ENCOUNTER — Ambulatory Visit: Payer: Medicare Other | Admitting: *Deleted

## 2014-11-12 DIAGNOSIS — Z471 Aftercare following joint replacement surgery: Secondary | ICD-10-CM | POA: Diagnosis not present

## 2014-11-16 ENCOUNTER — Encounter: Payer: Medicare Other | Admitting: Physical Therapy

## 2015-02-18 ENCOUNTER — Ambulatory Visit
Admission: RE | Admit: 2015-02-18 | Discharge: 2015-02-18 | Disposition: A | Discharge status: Home / Self Care | Payer: Medicare Other | Source: Ambulatory Visit

## 2015-02-18 ENCOUNTER — Other Ambulatory Visit: Payer: Self-pay

## 2015-02-18 DIAGNOSIS — Z1231 Encounter for screening mammogram for malignant neoplasm of breast: Secondary | ICD-10-CM

## 2015-02-20 NOTE — H&P (Signed)
TOTAL KNEE ADMISSION H&P  Patient is being admitted for left total knee arthroplasty.  Subjective:  Chief Complaint:   Left knee primary OA / pain.  HPI: Alexis Hopkins, 69 y.o. female, has a history of pain and functional disability in the left knee due to arthritis and has failed non-surgical conservative treatments for greater than 12 weeks to include NSAID's and/or analgesics, corticosteriod injections, viscosupplementation injections, use of assistive devices and activity modification.  Onset of symptoms was gradual, starting >10 years ago with gradually worsening course since that time. The patient noted prior procedures on the knee to include  arthroplasty on the right knee per Dr. Alvan Dame on 10/05/2014.  Patient currently rates pain in the left knee(s) at 10 out of 10 with activity. Patient has night pain, worsening of pain with activity and weight bearing, pain that interferes with activities of daily living, pain with passive range of motion, crepitus and joint swelling.  Patient has evidence of periarticular osteophytes and joint space narrowing by imaging studies.  There is no active infection.  Risks, benefits and expectations were discussed with the patient.  Risks including but not limited to the risk of anesthesia, blood clots, nerve damage, blood vessel damage, failure of the prosthesis, infection and up to and including death.  Patient understand the risks, benefits and expectations and wishes to proceed with surgery.   PCP: Mayra Neer, MD  D/C Plans:      Home with HHPT  Post-op Meds:       No Rx given  Tranexamic Acid:      To be given - topically (previous TIA)  Decadron:      Is to be given  FYI:     Plavix and ASA post-op Norco post-op   Previous Components:  DePuy Attune RP PS knee system  4N femur  4 tibia  7 mm AOX PS insert  71mm Anatomic patellar button   Patient Active Problem List   Diagnosis Date Noted  . S/P right TKA 10/05/2014  .  S/P knee replacement 10/05/2014  . Arthritis of shoulder region, right, degenerative 10/03/2013   Past Medical History  Diagnosis Date  . Hypertension   . Diabetes mellitus without complication   . TIA (transient ischemic attack) 1994    Takes Plavix  . History of kidney stones   . Hyperlipemia   . Arthritis   . Family history of adverse reaction to anesthesia     "mother always was deathly sick"  . Osteoporosis   . Hx of skin cancer, basal cell     Past Surgical History  Procedure Laterality Date  . Rotator cuff repair Right   . Shoulder arthroscopy Right   . Knee arthroscopy      X 1 on right; X 2 on left  . Abdominal hysterectomy  1995  . Reverse shoulder arthroplasty Right 10/03/2013    Procedure: RIGHT REVERSE SHOULDER ARTHROPLASTY;  Surgeon: Augustin Schooling, MD;  Location: [facility];  Service: Orthopedics;  Laterality: Right;  . Mohs surgery    . Total knee arthroplasty Right 10/05/2014    Procedure: RIGHT TOTAL KNEE ARTHROPLASTY;  Surgeon: Mauri Pole, MD;  Location: [facility];  Service: Orthopedics;  Laterality: Right;    No prescriptions prior to admission   Allergies  Allergen Reactions  . Shellfish Allergy Hives    Shrimp.    History  Substance Use Topics  . Smoking status: Never Smoker   . Smokeless tobacco: Not on file  . Alcohol  Use: No       Review of Systems  Constitutional: Negative.   HENT: Negative.   Eyes: Negative.   Respiratory: Negative.   Cardiovascular: Negative.   Gastrointestinal: Negative.   Genitourinary: Negative.   Musculoskeletal: Positive for joint pain.  Skin: Negative.   Neurological: Negative.   Endo/Heme/Allergies: Negative.   Psychiatric/Behavioral: Negative.     Objective:  Physical Exam  Constitutional: She is oriented to person, place, and time. She appears well-developed and well-nourished.  HENT:  Head: Normocephalic and atraumatic.  Eyes: Pupils are equal, round, and reactive to light.  Neck: Neck supple. No JVD  present. No tracheal deviation present. No thyromegaly present.  Cardiovascular: Normal rate, normal heart sounds and intact distal pulses.   Respiratory: Effort normal and breath sounds normal. No stridor. No respiratory distress. She has no wheezes.  GI: Soft. There is no tenderness. There is no guarding.  Musculoskeletal:       Left knee: She exhibits decreased range of motion, swelling and bony tenderness. She exhibits no ecchymosis, no deformity, no laceration and no erythema. Tenderness found.  Lymphadenopathy:    She has no cervical adenopathy.  Neurological: She is alert and oriented to person, place, and time.  Skin: Skin is warm and dry.  Psychiatric: She has a normal mood and affect.      Labs:  Estimated body mass index is 35.84 kg/(m^2) as calculated from the following:   Height as of 10/05/14: 5\' 2"  (1.575 m).   Weight as of 09/28/14: 88.905 kg (196 lb).   Imaging Review Plain radiographs demonstrate severe degenerative joint disease of the left knee(s). The overall alignment is neutral. The bone quality appears to be good for age and reported activity level.  Assessment/Plan:  End stage arthritis, left knee   The patient history, physical examination, clinical judgment of the provider and imaging studies are consistent with end stage degenerative joint disease of the left knee(s) and total knee arthroplasty is deemed medically necessary. The treatment options including medical management, injection therapy arthroscopy and arthroplasty were discussed at length. The risks and benefits of total knee arthroplasty were presented and reviewed. The risks due to aseptic loosening, infection, stiffness, patella tracking problems, thromboembolic complications and other imponderables were discussed. The patient acknowledged the explanation, agreed to proceed with the plan and consent was signed. Patient is being admitted for inpatient treatment for surgery, pain control, PT, OT,  prophylactic antibiotics, VTE prophylaxis, progressive ambulation and ADL's and discharge planning. The patient is planning to be discharged home with home health services.     West Pugh Kallyn Demarcus   PA-C  02/20/2015, 10:05 AM

## 2015-03-03 NOTE — Patient Instructions (Addendum)
YOUR PROCEDURE IS SCHEDULED ON :  03/09/15  REPORT TO [facility] MAIN ENTRANCE FOLLOW SIGNS TO SHORT STAY CENTER AT :  10:00 am  CALL THIS NUMBER IF YOU HAVE PROBLEMS THE MORNING OF SURGERY [phone]  REMEMBER:  DO NOT EAT FOOD  AFTER MIDNIGHT  MAY HAVE CLEAR LIQUIDS UNTIL 7:00 AM  CLEAR LIQUID DIET   Foods Allowed                                                                     Foods Excluded  Coffee and tea, regular and decaf                             liquids that you cannot  Plain Jell-O in any flavor                                             see through such as: Fruit ices (not with fruit pulp)                                     milk, soups, orange juice  Iced Popsicles                                                 All solid food Carbonated beverages, regular and diet                                    Cranberry, grape and apple juices Sports drinks like Gatorade Lightly seasoned clear broth or consume(fat free) Sugar, honey syrup   _____________________________________________________________________  TAKE THESE MEDICINES THE MORNING OF SURGERY:  AMLODIPINE  YOU MAY NOT HAVE ANY METAL ON YOUR BODY INCLUDING HAIR PINS AND PIERCING'S. DO NOT WEAR JEWELRY, MAKEUP, LOTIONS, POWDERS OR PERFUMES. DO NOT WEAR NAIL POLISH. DO NOT SHAVE 48 HRS PRIOR TO SURGERY. MEN MAY SHAVE FACE AND NECK.  DO NOT [place]. [facility] IS NOT RESPONSIBLE FOR VALUABLES.  CONTACTS, DENTURES OR PARTIALS MAY NOT BE WORN TO SURGERY. LEAVE SUITCASE IN CAR. CAN BE BROUGHT TO ROOM AFTER SURGERY.  PATIENTS DISCHARGED THE DAY OF SURGERY WILL NOT BE ALLOWED TO DRIVE HOME.  PLEASE READ OVER THE FOLLOWING INSTRUCTION SHEETS _________________________________________________________________________________                                          [place] - PREPARING FOR SURGERY  Before surgery, you can play an important role.  Because skin is not  sterile, your skin needs to be as free of germs as possible.  You can reduce the number of germs on your skin by washing with CHG (chlorahexidine gluconate) soap before surgery.  CHG is an antiseptic cleaner which kills germs and bonds with the skin to continue killing germs even after washing. Please DO NOT use if you have an allergy to CHG or antibacterial soaps.  If your skin becomes reddened/irritated stop using the CHG and inform your nurse when you arrive at Short Stay. Do not shave (including legs and underarms) for at least 48 hours prior to the first CHG shower.  You may shave your face. Please follow these instructions carefully:   1.  Shower with CHG Soap the night before surgery and the  morning of Surgery.   2.  If you choose to wash your hair, wash your hair first as usual with your  normal  Shampoo.   3.  After you shampoo, rinse your hair and body thoroughly to remove the  shampoo.                                         4.  Use CHG as you would any other liquid soap.  You can apply chg directly  to the skin and wash . Gently wash with scrungie or clean wascloth    5.  Apply the CHG Soap to your body ONLY FROM THE NECK DOWN.   Do not use on open                           Wound or open sores. Avoid contact with eyes, ears mouth and genitals (private parts).                        Genitals (private parts) with your normal soap.              6.  Wash thoroughly, paying special attention to the area where your surgery  will be performed.   7.  Thoroughly rinse your body with warm water from the neck down.   8.  DO NOT shower/wash with your normal soap after using and rinsing off  the CHG Soap .                9.  Pat yourself dry with a clean towel.             10.  Wear clean night clothes to bed after shower             11.  Place clean sheets on your bed the night of your first shower and do not  sleep with pets.  Day of Surgery : Do not apply any lotions/deodorants the  morning of surgery.  Please wear clean clothes to the hospital/surgery center.  FAILURE TO FOLLOW THESE INSTRUCTIONS MAY RESULT IN THE CANCELLATION OF YOUR SURGERY    PATIENT SIGNATURE_________________________________  ______________________________________________________________________     Alexis Hopkins  An incentive spirometer is a tool that can help keep your lungs clear and active. This tool measures how well you are filling your lungs with each breath. Taking long deep breaths may help reverse or decrease the chance of developing breathing (pulmonary) problems (especially infection) following:  A long period of time when you are unable to move or be active. BEFORE THE PROCEDURE   If the spirometer includes an indicator to show your best effort, your nurse or respiratory therapist will set it to a desired goal.  If possible, sit up straight  or lean slightly forward. Try not to slouch.  Hold the incentive spirometer in an upright position. INSTRUCTIONS FOR USE   Sit on the edge of your bed if possible, or sit up as far as you can in bed or on a chair.  Hold the incentive spirometer in an upright position.  Breathe out normally.  Place the mouthpiece in your mouth and seal your lips tightly around it.  Breathe in slowly and as deeply as possible, raising the piston or the ball toward the top of the column.  Hold your breath for 3-5 seconds or for as long as possible. Allow the piston or ball to fall to the bottom of the column.  Remove the mouthpiece from your mouth and breathe out normally.  Rest for a few seconds and repeat Steps 1 through 7 at least 10 times every 1-2 hours when you are awake. Take your time and take a few normal breaths between deep breaths.  The spirometer may include an indicator to show your best effort. Use the indicator as a goal to work toward during each repetition.  After each set of 10 deep breaths, practice coughing to be sure  your lungs are clear. If you have an incision (the cut made at the time of surgery), support your incision when coughing by placing a pillow or rolled up towels firmly against it. Once you are able to get out of bed, walk around indoors and cough well. You may stop using the incentive spirometer when instructed by your caregiver.  RISKS AND COMPLICATIONS  Take your time so you do not get dizzy or light-headed.  If you are in pain, you may need to take or ask for pain medication before doing incentive spirometry. It is harder to take a deep breath if you are having pain. AFTER USE  Rest and breathe slowly and easily.  It can be helpful to keep track of a log of your progress. Your caregiver can provide you with a simple table to help with this. If you are using the spirometer at home, follow these instructions: [place] IF:   You are having difficultly using the spirometer.  You have trouble using the spirometer as often as instructed.  Your pain medication is not giving enough relief while using the spirometer.  You develop fever of 100.5 F (38.1 C) or higher. SEEK IMMEDIATE MEDICAL CARE IF:   You cough up bloody sputum that had not been present before.  You develop fever of 102 F (38.9 C) or greater.  You develop worsening pain at or near the incision site. MAKE SURE YOU:   Understand these instructions.  Will watch your condition.  Will get help right away if you are not doing well or get worse. Document Released: 01/29/2007 Document Revised: 12/11/2011 Document Reviewed: 04/01/2007 Exit[employer] Patient Information 2014 [employer], [place].   ________________________________________________________________________  WHAT IS A BLOOD TRANSFUSION? Blood Transfusion Information  A transfusion is the replacement of blood or some of its parts. Blood is made up of multiple cells which provide different functions.  Red blood cells carry oxygen and are used for blood loss  replacement.  White blood cells fight against infection.  Platelets control bleeding.  Plasma helps clot blood.  Other blood products are available for specialized needs, such as hemophilia or other clotting disorders. BEFORE THE TRANSFUSION  Who gives blood for transfusions?   Healthy volunteers who are fully evaluated to make sure their blood is safe. This is blood bank  blood. Transfusion therapy is the safest it has ever been in the practice of medicine. Before blood is taken from a donor, a complete history is taken to make sure that person has no history of diseases nor engages in risky social behavior (examples are intravenous drug use or sexual activity with multiple partners). The donor's travel history is screened to minimize risk of transmitting infections, such as malaria. The donated blood is tested for signs of infectious diseases, such as HIV and hepatitis. The blood is then tested to be sure it is compatible with you in order to minimize the chance of a transfusion reaction. If you or a relative donates blood, this is often done in anticipation of surgery and is not appropriate for emergency situations. It takes many days to process the donated blood. RISKS AND COMPLICATIONS Although transfusion therapy is very safe and saves many lives, the main dangers of transfusion include:   Getting an infectious disease.  Developing a transfusion reaction. This is an allergic reaction to something in the blood you were given. Every precaution is taken to prevent this. The decision to have a blood transfusion has been considered carefully by your caregiver before blood is given. Blood is not given unless the benefits outweigh the risks. AFTER THE TRANSFUSION  Right after receiving a blood transfusion, you will usually feel much better and more energetic. This is especially true if your red blood cells have gotten low (anemic). The transfusion raises the level of the red blood cells which  carry oxygen, and this usually causes an energy increase.  The nurse administering the transfusion will monitor you carefully for complications. HOME CARE INSTRUCTIONS  No special instructions are needed after a transfusion. You may find your energy is better. Speak with your caregiver about any limitations on activity for underlying diseases you may have. SEEK MEDICAL CARE IF:   Your condition is not improving after your transfusion.  You develop redness or irritation at the intravenous (IV) site. SEEK IMMEDIATE MEDICAL CARE IF:  Any of the following symptoms occur over the next 12 hours:  Shaking chills.  You have a temperature by mouth above 102 F (38.9 C), not controlled by medicine.  Chest, back, or muscle pain.  People around you feel you are not acting correctly or are confused.  Shortness of breath or difficulty breathing.  Dizziness and fainting.  You get a rash or develop hives.  You have a decrease in urine output.  Your urine turns a dark color or changes to pink, red, or brown. Any of the following symptoms occur over the next 10 days:  You have a temperature by mouth above 102 F (38.9 C), not controlled by medicine.  Shortness of breath.  Weakness after normal activity.  The white part of the eye turns yellow (jaundice).  You have a decrease in the amount of urine or are urinating less often.  Your urine turns a dark color or changes to pink, red, or brown. Document Released: 09/15/2000 Document Revised: 12/11/2011 Document Reviewed: 05/04/2008 [employer] Patient Information 2014 [place], [place].  _______________________________________________________________________

## 2015-03-04 ENCOUNTER — Encounter (HOSPITAL_COMMUNITY): Payer: Self-pay

## 2015-03-04 ENCOUNTER — Encounter (HOSPITAL_COMMUNITY)
Admission: RE | Admit: 2015-03-04 | Discharge: 2015-03-04 | Disposition: A | Discharge status: Home / Self Care | Payer: Medicare Other | Source: Ambulatory Visit | Attending: Orthopedic Surgery | Admitting: Orthopedic Surgery

## 2015-03-04 DIAGNOSIS — M1712 Unilateral primary osteoarthritis, left knee: Secondary | ICD-10-CM | POA: Insufficient documentation

## 2015-03-04 DIAGNOSIS — Z01812 Encounter for preprocedural laboratory examination: Secondary | ICD-10-CM | POA: Insufficient documentation

## 2015-03-04 HISTORY — DX: Anemia, unspecified: D64.9

## 2015-03-04 HISTORY — DX: Complex regional pain syndrome I, unspecified: G90.50

## 2015-03-04 HISTORY — DX: Malignant (primary) neoplasm, unspecified: C80.1

## 2015-03-04 LAB — CBC
HEMATOCRIT: 39.4 % (ref 36.0–46.0)
Hemoglobin: 12.2 g/dL (ref 12.0–15.0)
MCH: 26.7 pg (ref 26.0–34.0)
MCHC: 31 g/dL (ref 30.0–36.0)
MCV: 86.2 fL (ref 78.0–100.0)
Platelets: 292 10*3/uL (ref 150–400)
RBC: 4.57 MIL/uL (ref 3.87–5.11)
RDW: 15 % (ref 11.5–15.5)
WBC: 5.4 10*3/uL (ref 4.0–10.5)

## 2015-03-04 LAB — URINALYSIS, ROUTINE W REFLEX MICROSCOPIC
Bilirubin Urine: NEGATIVE
GLUCOSE, UA: NEGATIVE mg/dL
Ketones, ur: NEGATIVE mg/dL
NITRITE: NEGATIVE
PROTEIN: NEGATIVE mg/dL
SPECIFIC GRAVITY, URINE: 1.019 (ref 1.005–1.030)
Urobilinogen, UA: 0.2 mg/dL (ref 0.0–1.0)
pH: 5 (ref 5.0–8.0)

## 2015-03-04 LAB — PROTIME-INR
INR: 1.02 (ref 0.00–1.49)
Prothrombin Time: 13.6 seconds (ref 11.6–15.2)

## 2015-03-04 LAB — BASIC METABOLIC PANEL
ANION GAP: 12 (ref 5–15)
BUN: 30 mg/dL — ABNORMAL HIGH (ref 6–20)
CHLORIDE: 105 mmol/L (ref 101–111)
CO2: 26 mmol/L (ref 22–32)
Calcium: 9.7 mg/dL (ref 8.9–10.3)
Creatinine, Ser: 1.16 mg/dL — ABNORMAL HIGH (ref 0.44–1.00)
GFR, EST AFRICAN AMERICAN: 55 mL/min — AB (ref >60)
GFR, EST NON AFRICAN AMERICAN: 47 mL/min — AB (ref >60)
Glucose, Bld: 79 mg/dL (ref 65–99)
POTASSIUM: 4.6 mmol/L (ref 3.5–5.1)
SODIUM: 143 mmol/L (ref 135–145)

## 2015-03-04 LAB — APTT: APTT: 31 s (ref 24–37)

## 2015-03-04 LAB — URINE MICROSCOPIC-ADD ON

## 2015-03-04 LAB — SURGICAL PCR SCREEN
MRSA, PCR: NEGATIVE
STAPHYLOCOCCUS AUREUS: NEGATIVE

## 2015-03-05 LAB — HEMOGLOBIN A1C
HEMOGLOBIN A1C: 6.7 % — AB (ref 4.8–5.6)
MEAN PLASMA GLUCOSE: 146 mg/dL

## 2015-03-05 NOTE — Progress Notes (Addendum)
BMP, urinalysis and micro, and HGA1C results per epic per PAT visit 03/04/2015 sent to Dr Alvan Dame

## 2015-03-09 ENCOUNTER — Inpatient Hospital Stay (HOSPITAL_COMMUNITY): Payer: Medicare Other | Admitting: Certified Registered Nurse Anesthetist

## 2015-03-09 ENCOUNTER — Encounter (HOSPITAL_COMMUNITY)
Admission: RE | Disposition: A | Discharge status: Home Health | Payer: Self-pay | Source: Ambulatory Visit | Attending: Orthopedic Surgery

## 2015-03-09 ENCOUNTER — Inpatient Hospital Stay (HOSPITAL_COMMUNITY)
Admission: RE | Admit: 2015-03-09 | Discharge: 2015-03-10 | DRG: 470 | Disposition: A | Discharge status: Home Health | Payer: Medicare Other | Source: Ambulatory Visit | Attending: Orthopedic Surgery | Admitting: Orthopedic Surgery

## 2015-03-09 ENCOUNTER — Encounter (HOSPITAL_COMMUNITY): Payer: Self-pay | Admitting: *Deleted

## 2015-03-09 DIAGNOSIS — M810 Age-related osteoporosis without current pathological fracture: Secondary | ICD-10-CM | POA: Diagnosis present

## 2015-03-09 DIAGNOSIS — Z87442 Personal history of urinary calculi: Secondary | ICD-10-CM | POA: Diagnosis not present

## 2015-03-09 DIAGNOSIS — I10 Essential (primary) hypertension: Secondary | ICD-10-CM | POA: Diagnosis not present

## 2015-03-09 DIAGNOSIS — Z96652 Presence of left artificial knee joint: Secondary | ICD-10-CM

## 2015-03-09 DIAGNOSIS — Z96651 Presence of right artificial knee joint: Secondary | ICD-10-CM | POA: Diagnosis present

## 2015-03-09 DIAGNOSIS — M25762 Osteophyte, left knee: Secondary | ICD-10-CM | POA: Diagnosis present

## 2015-03-09 DIAGNOSIS — E669 Obesity, unspecified: Secondary | ICD-10-CM | POA: Diagnosis not present

## 2015-03-09 DIAGNOSIS — Z8673 Personal history of transient ischemic attack (TIA), and cerebral infarction without residual deficits: Secondary | ICD-10-CM | POA: Diagnosis not present

## 2015-03-09 DIAGNOSIS — E785 Hyperlipidemia, unspecified: Secondary | ICD-10-CM | POA: Diagnosis present

## 2015-03-09 DIAGNOSIS — Z01812 Encounter for preprocedural laboratory examination: Secondary | ICD-10-CM | POA: Diagnosis not present

## 2015-03-09 DIAGNOSIS — Z85828 Personal history of other malignant neoplasm of skin: Secondary | ICD-10-CM

## 2015-03-09 DIAGNOSIS — M25462 Effusion, left knee: Secondary | ICD-10-CM | POA: Diagnosis present

## 2015-03-09 DIAGNOSIS — E119 Type 2 diabetes mellitus without complications: Secondary | ICD-10-CM | POA: Diagnosis present

## 2015-03-09 DIAGNOSIS — Z96611 Presence of right artificial shoulder joint: Secondary | ICD-10-CM | POA: Diagnosis present

## 2015-03-09 DIAGNOSIS — Z90710 Acquired absence of both cervix and uterus: Secondary | ICD-10-CM

## 2015-03-09 DIAGNOSIS — Z91013 Allergy to seafood: Secondary | ICD-10-CM

## 2015-03-09 DIAGNOSIS — M659 Synovitis and tenosynovitis, unspecified: Secondary | ICD-10-CM | POA: Diagnosis present

## 2015-03-09 DIAGNOSIS — M25562 Pain in left knee: Secondary | ICD-10-CM | POA: Diagnosis present

## 2015-03-09 DIAGNOSIS — Z96659 Presence of unspecified artificial knee joint: Secondary | ICD-10-CM

## 2015-03-09 DIAGNOSIS — M1712 Unilateral primary osteoarthritis, left knee: Principal | ICD-10-CM | POA: Diagnosis present

## 2015-03-09 DIAGNOSIS — Z6834 Body mass index (BMI) 34.0-34.9, adult: Secondary | ICD-10-CM

## 2015-03-09 HISTORY — PX: TOTAL KNEE ARTHROPLASTY: SHX125

## 2015-03-09 LAB — GLUCOSE, CAPILLARY
GLUCOSE-CAPILLARY: 287 mg/dL — AB (ref 65–99)
Glucose-Capillary: 97 mg/dL (ref 65–99)
Glucose-Capillary: 99 mg/dL (ref 65–99)
Glucose-Capillary: 147 mg/dL — ABNORMAL HIGH (ref 65–99)

## 2015-03-09 LAB — TYPE AND SCREEN
ABO/RH(D): AB POS
ANTIBODY SCREEN: NEGATIVE

## 2015-03-09 SURGERY — ARTHROPLASTY, KNEE, TOTAL
Anesthesia: Spinal | Site: Knee | Laterality: Left

## 2015-03-09 MED ORDER — LACTATED RINGERS IV SOLN
INTRAVENOUS | Status: DC
  Administered 2015-03-09: 1000 mL via INTRAVENOUS

## 2015-03-09 MED ORDER — DIPHENHYDRAMINE HCL 25 MG PO CAPS: 25.0000 mg | ORAL_CAPSULE | Freq: Four times a day (QID) | ORAL | Status: DC | PRN

## 2015-03-09 MED ORDER — HYDROCHLOROTHIAZIDE 25 MG PO TABS
25.0000 mg | ORAL_TABLET | Freq: Every morning | ORAL | Status: DC
  Administered 2015-03-10: 25 mg via ORAL
  Filled 2015-03-09: qty 1

## 2015-03-09 MED ORDER — CEFAZOLIN SODIUM-DEXTROSE 2-3 GM-% IV SOLR
2.0000 g | INTRAVENOUS | Status: AC
  Administered 2015-03-09: 2 g via INTRAVENOUS

## 2015-03-09 MED ORDER — KETOROLAC TROMETHAMINE 30 MG/ML IJ SOLN
INTRAMUSCULAR | Status: DC | PRN
  Administered 2015-03-09: 30 mg

## 2015-03-09 MED ORDER — FENTANYL CITRATE (PF) 100 MCG/2ML IJ SOLN: 25.0000 ug | INTRAMUSCULAR | Status: DC | PRN

## 2015-03-09 MED ORDER — CHLORHEXIDINE GLUCONATE 4 % EX LIQD: 60.0000 mL | Freq: Once | CUTANEOUS | Status: DC

## 2015-03-09 MED ORDER — MEPERIDINE HCL 50 MG/ML IJ SOLN: 6.2500 mg | INTRAMUSCULAR | Status: DC | PRN

## 2015-03-09 MED ORDER — DEXAMETHASONE SODIUM PHOSPHATE 10 MG/ML IJ SOLN
10.0000 mg | Freq: Once | INTRAMUSCULAR | Status: DC
Start: 2015-03-10 — End: 2015-03-10
  Filled 2015-03-09: qty 1

## 2015-03-09 MED ORDER — CLOPIDOGREL BISULFATE 75 MG PO TABS
75.0000 mg | ORAL_TABLET | Freq: Every day | ORAL | Status: DC
  Administered 2015-03-10: 75 mg via ORAL
  Filled 2015-03-09 (×2): qty 1

## 2015-03-09 MED ORDER — KETOROLAC TROMETHAMINE 30 MG/ML IJ SOLN
INTRAMUSCULAR | Status: AC
  Filled 2015-03-09: qty 1

## 2015-03-09 MED ORDER — METFORMIN HCL 500 MG PO TABS
1000.0000 mg | ORAL_TABLET | Freq: Two times a day (BID) | ORAL | Status: DC
  Administered 2015-03-10: 1000 mg via ORAL
  Filled 2015-03-09 (×3): qty 2

## 2015-03-09 MED ORDER — PROPOFOL 10 MG/ML IV BOLUS
INTRAVENOUS | Status: AC
  Filled 2015-03-09: qty 20

## 2015-03-09 MED ORDER — ONDANSETRON HCL 4 MG PO TABS: 4.0000 mg | ORAL_TABLET | Freq: Four times a day (QID) | ORAL | Status: DC | PRN

## 2015-03-09 MED ORDER — SODIUM CHLORIDE 0.9 % IV SOLN
INTRAVENOUS | Status: DC
  Administered 2015-03-09 – 2015-03-10 (×2): via INTRAVENOUS
  Filled 2015-03-09 (×5): qty 1000

## 2015-03-09 MED ORDER — SODIUM CHLORIDE 0.9 % IJ SOLN
INTRAMUSCULAR | Status: DC | PRN
  Administered 2015-03-09: 30 mL

## 2015-03-09 MED ORDER — EPHEDRINE SULFATE 50 MG/ML IJ SOLN
INTRAMUSCULAR | Status: AC
  Filled 2015-03-09: qty 1

## 2015-03-09 MED ORDER — AMLODIPINE BESYLATE 10 MG PO TABS
10.0000 mg | ORAL_TABLET | Freq: Every morning | ORAL | Status: DC
  Administered 2015-03-10: 10 mg via ORAL
  Filled 2015-03-09: qty 1

## 2015-03-09 MED ORDER — ALUM & MAG HYDROXIDE-SIMETH 200-200-20 MG/5ML PO SUSP: 30.0000 mL | ORAL | Status: DC | PRN

## 2015-03-09 MED ORDER — PHENYLEPHRINE 40 MCG/ML (10ML) SYRINGE FOR IV PUSH (FOR BLOOD PRESSURE SUPPORT)
PREFILLED_SYRINGE | INTRAVENOUS | Status: AC
  Filled 2015-03-09: qty 10

## 2015-03-09 MED ORDER — BUPIVACAINE-EPINEPHRINE (PF) 0.25% -1:200000 IJ SOLN
INTRAMUSCULAR | Status: AC
  Filled 2015-03-09: qty 30

## 2015-03-09 MED ORDER — ASPIRIN 325 MG PO TABS
325.0000 mg | ORAL_TABLET | Freq: Every morning | ORAL | Status: DC
  Administered 2015-03-10: 325 mg via ORAL
  Filled 2015-03-09 (×3): qty 1

## 2015-03-09 MED ORDER — ONDANSETRON HCL 4 MG/2ML IJ SOLN
INTRAMUSCULAR | Status: AC
  Filled 2015-03-09: qty 2

## 2015-03-09 MED ORDER — CEFAZOLIN SODIUM-DEXTROSE 2-3 GM-% IV SOLR
2.0000 g | Freq: Four times a day (QID) | INTRAVENOUS | Status: AC
  Administered 2015-03-09 – 2015-03-10 (×2): 2 g via INTRAVENOUS
  Filled 2015-03-09 (×2): qty 50

## 2015-03-09 MED ORDER — DEXAMETHASONE SODIUM PHOSPHATE 10 MG/ML IJ SOLN
INTRAMUSCULAR | Status: AC
  Filled 2015-03-09: qty 1

## 2015-03-09 MED ORDER — SODIUM CHLORIDE 0.9 % IJ SOLN
INTRAMUSCULAR | Status: AC
  Filled 2015-03-09: qty 50

## 2015-03-09 MED ORDER — HYDROCODONE-ACETAMINOPHEN 7.5-325 MG PO TABS
1.0000 | ORAL_TABLET | ORAL | Status: DC
  Administered 2015-03-09: 2 via ORAL
  Administered 2015-03-09: 1 via ORAL
  Administered 2015-03-10 (×4): 2 via ORAL
  Filled 2015-03-09 (×6): qty 2

## 2015-03-09 MED ORDER — FERROUS SULFATE 325 (65 FE) MG PO TABS
325.0000 mg | ORAL_TABLET | Freq: Three times a day (TID) | ORAL | Status: DC
  Administered 2015-03-09 – 2015-03-10 (×3): 325 mg via ORAL
  Filled 2015-03-09 (×5): qty 1

## 2015-03-09 MED ORDER — TRANEXAMIC ACID 1000 MG/10ML IV SOLN
2000.0000 mg | Freq: Once | INTRAVENOUS | Status: DC
  Filled 2015-03-09: qty 20

## 2015-03-09 MED ORDER — HYDROMORPHONE HCL 1 MG/ML IJ SOLN: 0.5000 mg | INTRAMUSCULAR | Status: DC | PRN

## 2015-03-09 MED ORDER — ATORVASTATIN CALCIUM 40 MG PO TABS
40.0000 mg | ORAL_TABLET | Freq: Every evening | ORAL | Status: DC
  Administered 2015-03-09: 40 mg via ORAL
  Filled 2015-03-09 (×2): qty 1

## 2015-03-09 MED ORDER — DEXTROSE 5 % IV SOLN
500.0000 mg | Freq: Four times a day (QID) | INTRAVENOUS | Status: DC | PRN
  Administered 2015-03-09: 500 mg via INTRAVENOUS
  Filled 2015-03-09 (×2): qty 5

## 2015-03-09 MED ORDER — ONDANSETRON HCL 4 MG/2ML IJ SOLN
INTRAMUSCULAR | Status: DC | PRN
  Administered 2015-03-09: 4 mg via INTRAVENOUS

## 2015-03-09 MED ORDER — PROPOFOL INFUSION 10 MG/ML OPTIME
INTRAVENOUS | Status: DC | PRN
Start: 2015-03-09 — End: 2015-03-09
  Administered 2015-03-09: 50 ug/kg/min via INTRAVENOUS

## 2015-03-09 MED ORDER — FENTANYL CITRATE (PF) 100 MCG/2ML IJ SOLN
INTRAMUSCULAR | Status: AC
  Filled 2015-03-09: qty 2

## 2015-03-09 MED ORDER — CEFAZOLIN SODIUM-DEXTROSE 2-3 GM-% IV SOLR
INTRAVENOUS | Status: AC
  Filled 2015-03-09: qty 50

## 2015-03-09 MED ORDER — BISACODYL 10 MG RE SUPP: 10.0000 mg | Freq: Every day | RECTAL | Status: DC | PRN

## 2015-03-09 MED ORDER — METOCLOPRAMIDE HCL 5 MG/ML IJ SOLN
5.0000 mg | Freq: Three times a day (TID) | INTRAMUSCULAR | Status: DC | PRN
Start: 2015-03-09 — End: 2015-03-10

## 2015-03-09 MED ORDER — BUPIVACAINE-EPINEPHRINE (PF) 0.25% -1:200000 IJ SOLN
INTRAMUSCULAR | Status: DC | PRN
  Administered 2015-03-09: 30 mL

## 2015-03-09 MED ORDER — EPHEDRINE SULFATE 50 MG/ML IJ SOLN
INTRAMUSCULAR | Status: DC | PRN
  Administered 2015-03-09: 10 mg via INTRAVENOUS

## 2015-03-09 MED ORDER — INSULIN ASPART 100 UNIT/ML ~~LOC~~ SOLN
0.0000 [IU] | Freq: Three times a day (TID) | SUBCUTANEOUS | Status: DC
  Administered 2015-03-09: 2 [IU] via SUBCUTANEOUS

## 2015-03-09 MED ORDER — DOCUSATE SODIUM 100 MG PO CAPS
100.0000 mg | ORAL_CAPSULE | Freq: Two times a day (BID) | ORAL | Status: DC
  Administered 2015-03-09 – 2015-03-10 (×2): 100 mg via ORAL

## 2015-03-09 MED ORDER — COLESEVELAM HCL 625 MG PO TABS
1875.0000 mg | ORAL_TABLET | Freq: Two times a day (BID) | ORAL | Status: DC
  Administered 2015-03-09 – 2015-03-10 (×2): 1875 mg via ORAL
  Filled 2015-03-09 (×4): qty 3

## 2015-03-09 MED ORDER — MIDAZOLAM HCL 2 MG/2ML IJ SOLN
INTRAMUSCULAR | Status: AC
  Filled 2015-03-09: qty 2

## 2015-03-09 MED ORDER — GLIMEPIRIDE 2 MG PO TABS
2.0000 mg | ORAL_TABLET | Freq: Every day | ORAL | Status: DC
  Administered 2015-03-10: 2 mg via ORAL
  Filled 2015-03-09 (×2): qty 1

## 2015-03-09 MED ORDER — LACTATED RINGERS IV SOLN: INTRAVENOUS | Status: DC

## 2015-03-09 MED ORDER — BUPIVACAINE IN DEXTROSE 0.75-8.25 % IT SOLN
INTRATHECAL | Status: DC | PRN
  Administered 2015-03-09: 12 mg via INTRATHECAL

## 2015-03-09 MED ORDER — TRANEXAMIC ACID 1000 MG/10ML IV SOLN
2000.0000 mg | INTRAVENOUS | Status: DC | PRN
  Administered 2015-03-09: 2000 mg via TOPICAL

## 2015-03-09 MED ORDER — SODIUM CHLORIDE 0.9 % IR SOLN
Status: DC | PRN
  Administered 2015-03-09: 1000 mL

## 2015-03-09 MED ORDER — PHENOL 1.4 % MT LIQD
1.0000 | OROMUCOSAL | Status: DC | PRN
  Filled 2015-03-09: qty 177

## 2015-03-09 MED ORDER — METHOCARBAMOL 500 MG PO TABS: 500.0000 mg | ORAL_TABLET | Freq: Four times a day (QID) | ORAL | Status: DC | PRN

## 2015-03-09 MED ORDER — MENTHOL 3 MG MT LOZG
1.0000 | LOZENGE | OROMUCOSAL | Status: DC | PRN
Start: 2015-03-09 — End: 2015-03-10

## 2015-03-09 MED ORDER — 0.9 % SODIUM CHLORIDE (POUR BTL) OPTIME
TOPICAL | Status: DC | PRN
  Administered 2015-03-09: 1000 mL

## 2015-03-09 MED ORDER — PHENYLEPHRINE HCL 10 MG/ML IJ SOLN
INTRAMUSCULAR | Status: AC
  Filled 2015-03-09: qty 1

## 2015-03-09 MED ORDER — MAGNESIUM CITRATE PO SOLN: 1.0000 | Freq: Once | ORAL | Status: AC | PRN

## 2015-03-09 MED ORDER — METOCLOPRAMIDE HCL 10 MG PO TABS
5.0000 mg | ORAL_TABLET | Freq: Three times a day (TID) | ORAL | Status: DC | PRN
Start: 2015-03-09 — End: 2015-03-10

## 2015-03-09 MED ORDER — ONDANSETRON HCL 4 MG/2ML IJ SOLN: 4.0000 mg | Freq: Four times a day (QID) | INTRAMUSCULAR | Status: DC | PRN

## 2015-03-09 MED ORDER — FENTANYL CITRATE (PF) 100 MCG/2ML IJ SOLN
INTRAMUSCULAR | Status: DC | PRN
  Administered 2015-03-09 (×2): 50 ug via INTRAVENOUS

## 2015-03-09 MED ORDER — POLYETHYLENE GLYCOL 3350 17 G PO PACK
17.0000 g | PACK | Freq: Two times a day (BID) | ORAL | Status: DC
  Administered 2015-03-09 – 2015-03-10 (×2): 17 g via ORAL

## 2015-03-09 MED ORDER — DEXAMETHASONE SODIUM PHOSPHATE 10 MG/ML IJ SOLN
10.0000 mg | Freq: Once | INTRAMUSCULAR | Status: AC
  Administered 2015-03-09: 10 mg via INTRAVENOUS

## 2015-03-09 MED ORDER — PROMETHAZINE HCL 25 MG/ML IJ SOLN
6.2500 mg | INTRAMUSCULAR | Status: DC | PRN
Start: 2015-03-09 — End: 2015-03-09

## 2015-03-09 SURGICAL SUPPLY — 63 items
BAG DECANTER FOR FLEXI CONT (MISCELLANEOUS) ×1 IMPLANT
BAG SPEC THK2 15X12 ZIP CLS (MISCELLANEOUS) ×1
BAG ZIPLOCK 12X15 (MISCELLANEOUS) ×1 IMPLANT
BANDAGE ELASTIC 6 VELCRO ST LF (GAUZE/BANDAGES/DRESSINGS) ×2 IMPLANT
BANDAGE ESMARK 6X9 LF (GAUZE/BANDAGES/DRESSINGS) ×1 IMPLANT
BLADE SAW SGTL 13.0X1.19X90.0M (BLADE) ×2 IMPLANT
BNDG CMPR 9X6 STRL LF SNTH (GAUZE/BANDAGES/DRESSINGS) ×1
BNDG ESMARK 6X9 LF (GAUZE/BANDAGES/DRESSINGS) ×2
BONE CEMENT GENTAMICIN (Cement) ×4 IMPLANT
BOWL SMART MIX CTS (DISPOSABLE) ×2 IMPLANT
CAPT KNEE TOTAL 3 ATTUNE ×1 IMPLANT
CEMENT BONE GENTAMICIN 40 (Cement) IMPLANT
CUFF TOURN SGL QUICK 34 (TOURNIQUET CUFF) ×2
CUFF TRNQT CYL 34X4X40X1 (TOURNIQUET CUFF) ×1 IMPLANT
DECANTER SPIKE VIAL GLASS SM (MISCELLANEOUS) ×1 IMPLANT
DRAPE EXTREMITY T 121X128X90 (DRAPE) ×2 IMPLANT
DRAPE POUCH INSTRU U-SHP 10X18 (DRAPES) ×2 IMPLANT
DRAPE U-SHAPE 47X51 STRL (DRAPES) ×2 IMPLANT
DRSG AQUACEL AG ADV 3.5X10 (GAUZE/BANDAGES/DRESSINGS) ×2 IMPLANT
DURAPREP 26ML APPLICATOR (WOUND CARE) ×4 IMPLANT
ELECT REM PT RETURN 9FT ADLT (ELECTROSURGICAL) ×2
ELECTRODE REM PT RTRN 9FT ADLT (ELECTROSURGICAL) ×1 IMPLANT
FACESHIELD WRAPAROUND (MASK) ×10 IMPLANT
FACESHIELD WRAPAROUND OR TEAM (MASK) ×5 IMPLANT
GLOVE BIOGEL PI IND STRL 7.0 (GLOVE) IMPLANT
GLOVE BIOGEL PI IND STRL 7.5 (GLOVE) ×1 IMPLANT
GLOVE BIOGEL PI IND STRL 8.5 (GLOVE) ×1 IMPLANT
GLOVE BIOGEL PI INDICATOR 7.0 (GLOVE) ×2
GLOVE BIOGEL PI INDICATOR 7.5 (GLOVE) ×2
GLOVE BIOGEL PI INDICATOR 8.5 (GLOVE) ×1
GLOVE ECLIPSE 8.0 STRL XLNG CF (GLOVE) ×3 IMPLANT
GLOVE ORTHO TXT STRL SZ7.5 (GLOVE) ×4 IMPLANT
GLOVE SURG SS PI 6.5 STRL IVOR (GLOVE) ×4 IMPLANT
GLOVE SURG SS PI 7.5 STRL IVOR (GLOVE) ×1 IMPLANT
GOWN SPEC L3 XXLG W/TWL (GOWN DISPOSABLE) ×2 IMPLANT
GOWN STRL NON-REIN LRG LVL3 (GOWN DISPOSABLE) ×1 IMPLANT
GOWN STRL REUS W/ TWL XL LVL3 (GOWN DISPOSABLE) IMPLANT
GOWN STRL REUS W/TWL LRG LVL3 (GOWN DISPOSABLE) ×3 IMPLANT
GOWN STRL REUS W/TWL XL LVL3 (GOWN DISPOSABLE) ×4
HANDPIECE INTERPULSE COAX TIP (DISPOSABLE) ×2
KIT BASIN OR (CUSTOM PROCEDURE TRAY) ×2 IMPLANT
LIQUID BAND (GAUZE/BANDAGES/DRESSINGS) ×2 IMPLANT
MANIFOLD NEPTUNE II (INSTRUMENTS) ×2 IMPLANT
NDL SAFETY ECLIPSE 18X1.5 (NEEDLE) ×1 IMPLANT
NEEDLE HYPO 18GX1.5 SHARP (NEEDLE) ×4
PACK TOTAL JOINT (CUSTOM PROCEDURE TRAY) ×2 IMPLANT
PEN SKIN MARKING BROAD (MISCELLANEOUS) ×2 IMPLANT
POSITIONER SURGICAL ARM (MISCELLANEOUS) ×2 IMPLANT
SET HNDPC FAN SPRY TIP SCT (DISPOSABLE) ×1 IMPLANT
SET PAD KNEE POSITIONER (MISCELLANEOUS) ×2 IMPLANT
SUCTION FRAZIER 12FR DISP (SUCTIONS) ×2 IMPLANT
SUT MNCRL AB 4-0 PS2 18 (SUTURE) ×2 IMPLANT
SUT VIC AB 1 CT1 36 (SUTURE) ×2 IMPLANT
SUT VIC AB 2-0 CT1 27 (SUTURE) ×6
SUT VIC AB 2-0 CT1 TAPERPNT 27 (SUTURE) ×3 IMPLANT
SUT VLOC 180 0 24IN GS25 (SUTURE) ×2 IMPLANT
SYR 50ML LL SCALE MARK (SYRINGE) ×3 IMPLANT
TOWEL OR 17X26 10 PK STRL BLUE (TOWEL DISPOSABLE) ×2 IMPLANT
TOWEL OR NON WOVEN STRL DISP B (DISPOSABLE) ×1 IMPLANT
TRAY FOLEY W/METER SILVER 14FR (SET/KITS/TRAYS/PACK) ×2 IMPLANT
WATER STERILE IRR 1500ML POUR (IV SOLUTION) ×2 IMPLANT
WRAP KNEE MAXI GEL POST OP (GAUZE/BANDAGES/DRESSINGS) ×2 IMPLANT
YANKAUER SUCT BULB TIP 10FT TU (MISCELLANEOUS) ×2 IMPLANT

## 2015-03-09 NOTE — Anesthesia Procedure Notes (Signed)
Spinal Patient location during procedure: OR Staffing Anesthesiologist: Montez Hageman Performed by: anesthesiologist  Preanesthetic Checklist Completed: patient identified, site marked, surgical consent, pre-op evaluation, timeout performed, IV checked, risks and benefits discussed and monitors and equipment checked Spinal Block Patient position: sitting Prep: Betadine Patient monitoring: heart rate, continuous pulse ox and blood pressure Approach: right paramedian Location: L3-4 Injection technique: single-shot Needle Needle type: Spinocan  Needle gauge: 22 G Needle length: 9 cm Additional Notes Expiration date of kit checked and confirmed. Patient tolerated procedure well, without complications.

## 2015-03-09 NOTE — Anesthesia Preprocedure Evaluation (Addendum)
Anesthesia Evaluation  Patient identified by MRN, date of birth, ID band Patient awake    Reviewed: Allergy & Precautions, NPO status , Patient's Chart, lab work & pertinent test results  Airway Mallampati: II  TM Distance: >3 FB Neck ROM: Full    Dental no notable dental hx.    Pulmonary neg pulmonary ROS,  breath sounds clear to auscultation  Pulmonary exam normal       Cardiovascular hypertension, Pt. on medications Normal cardiovascular examRhythm:Regular Rate:Normal     Neuro/Psych TIAnegative psych ROS   GI/Hepatic negative GI ROS, Neg liver ROS,   Endo/Other  diabetes, Type 2, Oral Hypoglycemic Agents  Renal/GU negative Renal ROS  negative genitourinary   Musculoskeletal negative musculoskeletal ROS (+)   Abdominal   Peds negative pediatric ROS (+)  Hematology negative hematology ROS (+)   Anesthesia Other Findings   Reproductive/Obstetrics negative OB ROS                            Anesthesia Physical Anesthesia Plan  ASA: II  Anesthesia Plan: Spinal   Post-op Pain Management:    Induction:   Airway Management Planned: Simple Face Mask  Additional Equipment:   Intra-op Plan:   Post-operative Plan:   Informed Consent: I have reviewed the patients History and Physical, chart, labs and discussed the procedure including the risks, benefits and alternatives for the proposed anesthesia with the patient or authorized representative who has indicated his/her understanding and acceptance.   Dental advisory given  Plan Discussed with: CRNA  Anesthesia Plan Comments: (Pt did not stop Plavix and aspirin on 6/1 as documented elsewhere in this chart. She stopped on May 29th which is >7days. Spinal.)                                         Anesthesia Evaluation  Patient identified by MRN, date of birth, ID band Patient awake    Reviewed: Allergy & Precautions, H&P , NPO  status , Patient's Chart, lab work & pertinent test results  Airway Mallampati: I  TM Distance: >3 FB Neck ROM: Full    Dental  (+) Dental Advisory Given, Teeth Intact   Pulmonary neg pulmonary ROS,          Cardiovascular hypertension, Pt. on medications negative cardio ROS      Neuro/Psych TIAnegative psych ROS   GI/Hepatic negative GI ROS, Neg liver ROS,   Endo/Other  diabetes, Well Controlled, Type 2, Oral Hypoglycemic Agents  Renal/GU negative Renal ROS     Musculoskeletal  (+) Arthritis -,   Abdominal (+) + obese,   Peds  Hematology negative hematology ROS (+)   Anesthesia Other Findings   Reproductive/Obstetrics negative OB ROS                            Anesthesia Physical  Anesthesia Plan  ASA: II  Anesthesia Plan: Spinal   Post-op Pain Management:    Induction:   Airway Management Planned:   Additional Equipment:   Intra-op Plan:   Post-operative Plan:   Informed Consent: I have reviewed the patients History and Physical, chart, labs and discussed the procedure including the risks, benefits and alternatives for the proposed anesthesia with the patient or authorized representative who has indicated his/her understanding and acceptance.   Dental advisory given  Plan Discussed with: CRNA  Anesthesia Plan Comments:         Anesthesia Quick Evaluation  Anesthesia Quick Evaluation

## 2015-03-09 NOTE — Op Note (Signed)
NAME:  Alexis Hopkins                      MEDICAL RECORD NO.:  [id number]                             FACILITY:  [facility]      PHYSICIAN:  Pietro Cassis. Alvan Dame, M.D.  DATE OF BIRTH:  January 27, 1946      DATE OF PROCEDURE:  03/09/2015                                     OPERATIVE REPORT         PREOPERATIVE DIAGNOSIS:  Left knee osteoarthritis.      POSTOPERATIVE DIAGNOSIS:  Left knee osteoarthritis.      FINDINGS:  The patient was noted to have complete loss of cartilage and   bone-on-bone arthritis with associated osteophytes in the lateral and patellofemoral compartments of   the knee with a significant synovitis and associated effusion.      PROCEDURE:  Left total knee replacement.      COMPONENTS USED:  DePuy Attune rotating platform posterior stabilized knee   system, a size 4N femur, 4 tibia, size 6 mm PS AOX insert, and 35 anatomic AOX patellar   button.      SURGEON:  Pietro Cassis. Alvan Dame, M.D.      ASSISTANT:  Danae Orleans, PA-C.      ANESTHESIA:  Spinal.      SPECIMENS:  None.      COMPLICATION:  None.      DRAINS:  None.  EBL: <50cc      TOURNIQUET TIME:   Total Tourniquet Time Documented: Thigh (Left) - 30 minutes Total: Thigh (Left) - 30 minutes at 232mmHg  .      The patient was stable to the recovery room.      INDICATION FOR PROCEDURE:  Alexis Hopkins is a 69 y.o. female patient of   mine.  The patient had been seen, evaluated, and treated conservatively in the   office with medication, activity modification, and injections.  The patient had   radiographic changes of bone-on-bone arthritis with endplate sclerosis and osteophytes noted.      The patient failed conservative measures including medication, injections, and activity modification, and at this point was ready for more definitive measures.   Based on the radiographic changes and failed conservative measures, the patient   decided to proceed with total knee replacement.  Risks of infection,   DVT,  component failure, need for revision surgery, postop course, and   expectations were all   discussed and reviewed.  Consent was obtained for benefit of pain   relief.      PROCEDURE IN DETAIL:  The patient was brought to the operative theater.   Once adequate anesthesia, preoperative antibiotics, 2 gm of Ancef, and 10mg  of Decadron administered, the patient was positioned supine with the left thigh tourniquet placed.  The  left lower extremity was prepped and draped in sterile fashion.  A time-   out was performed identifying the patient, planned procedure, and   extremity.      The left lower extremity was placed in the [facility] leg holder.  The leg was   exsanguinated, tourniquet elevated to 250 mmHg.  A midline incision was   made followed by median  parapatellar arthrotomy.  Following initial   exposure, attention was first directed to the patella.  Precut   measurement was noted to be 24 mm.  I resected down to 14 mm and used a   35 patellar button to restore patellar height as well as cover the cut   surface.      The lug holes were drilled and a metal shim was placed to protect the   patella from retractors and saw blades.      At this point, attention was now directed to the femur.  The femoral   canal was opened with a drill, irrigated to try to prevent fat emboli.  An   intramedullary rod was passed at 3 degrees valgus, 9 mm of bone was   resected off the distal femur.  Following this resection, the tibia was   subluxated anteriorly.  Using the extramedullary guide, 2 mm of bone was resected off   the proximal lateral tibia.  We confirmed the gap would be   stable medially and laterally with a size 5 mm insert as well as confirmed   the cut was perpendicular in the coronal plane, checking with an alignment rod.      Once this was done, I sized the femur to be a size 4 in the anterior-   posterior dimension, chose a narrow component based on medial and   lateral dimension.  The  size 4 rotation block was then pinned in   position anterior referenced using the C-clamp to set rotation.  The   anterior, posterior, and  chamfer cuts were made without difficulty nor   notching making certain that I was along the anterior cortex to help   with flexion gap stability.      The final box cut was made off the lateral aspect of distal femur.      At this point, the tibia was sized to be a size 4, the size 4 tray was   then pinned in position through the medial third of the tubercle,   drilled, and keel punched.  Trial reduction was now carried with a 4 femur,  4 tibia, a size 5 then 6 mm insert, and the 35 patella botton.  The knee was brought to   extension, full extension with good flexion stability with the patella   tracking through the trochlea without application of pressure.  Given   all these findings, I drilled for the femoral lugs and then removed the trial components removed.  Final components were   opened and cement was mixed.  The knee was irrigated with normal saline   solution and pulse lavage.  The synovial lining was   then injected with 30cc of 0.25% Marcaine with epinephrine and 1 cc of Toradol plus 30cc of NS for a  total of 61 cc.      The knee was irrigated.  Final implants were then cemented onto clean and   dried cut surfaces of bone with the knee brought to extension with a size 6 mm trial insert.      Once the cement had fully cured, the excess cement was removed   throughout the knee.  I confirmed I was satisfied with the range of   motion and stability, and the final size 6 mm PS AOX insert was chosen.  It was   placed into the knee.      The tourniquet had been let down at 30 minutes.  No significant  hemostasis required.  The   extensor mechanism was then reapproximated using #1 Vicryl and #0 V-lock sutures with the knee   in flexion.  We then injected the knee with 2gm of Tranexamic Acid topically within the joints.  The   remaining  wound was closed with 2-0 Vicryl and running 4-0 Monocryl.   The knee was cleaned, dried, dressed sterilely using Dermabond and   Aquacel dressing.  The patient was then   brought to recovery room in stable condition, tolerating the procedure   well.   Please note that Physician Assistant, Danae Orleans, PA-C, was present for the entirety of the case, and was utilized for pre-operative positioning, peri-operative retractor management, general facilitation of the procedure.  He was also utilized for primary wound closure at the end of the case.              Pietro Cassis Alvan Dame, M.D.    03/09/2015 2:19 PM

## 2015-03-09 NOTE — Transfer of Care (Signed)
Immediate Anesthesia Transfer of Care Note  Patient: Alexis Hopkins  Procedure(s) Performed: Procedure(s): LEFT TOTAL KNEE ARTHROPLASTY (Left)  Patient Location: PACU  Anesthesia Type:MAC and Spinal  Level of Consciousness: awake, alert , oriented and patient cooperative  Airway & Oxygen Therapy: Patient Spontanous Breathing and Patient connected to face mask oxygen  Post-op Assessment: Report given to RN, Post -op Vital signs reviewed and stable and Patient moving all extremities  Post vital signs: Reviewed and stable  Last Vitals:  Filed Vitals:   03/09/15 0958  BP: 138/72  Pulse: 86  Temp: 36.6 C  Resp: 18    Complications: No apparent anesthesia complications

## 2015-03-09 NOTE — Interval H&P Note (Signed)
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 After consideration of risks, benefits and other options for treatment, the patient has consented to  Procedure(s): LEFT TOTAL KNEE ARTHROPLASTY (Left) as a surgical intervention .  The patient's history has been reviewed, patient examined, no change in status, stable for surgery.  I have reviewed the patient's chart and labs.  Questions were answered to the patient's satisfaction.     Mauri Pole

## 2015-03-10 ENCOUNTER — Encounter (HOSPITAL_COMMUNITY): Payer: Self-pay | Admitting: Orthopedic Surgery

## 2015-03-10 DIAGNOSIS — E669 Obesity, unspecified: Secondary | ICD-10-CM | POA: Diagnosis present

## 2015-03-10 LAB — BASIC METABOLIC PANEL
Anion gap: 9 (ref 5–15)
BUN: 26 mg/dL — ABNORMAL HIGH (ref 6–20)
CALCIUM: 8.6 mg/dL — AB (ref 8.9–10.3)
CO2: 22 mmol/L (ref 22–32)
Chloride: 108 mmol/L (ref 101–111)
Creatinine, Ser: 1.14 mg/dL — ABNORMAL HIGH (ref 0.44–1.00)
GFR calc Af Amer: 56 mL/min — ABNORMAL LOW (ref >60)
GFR calc non Af Amer: 48 mL/min — ABNORMAL LOW (ref >60)
Glucose, Bld: 210 mg/dL — ABNORMAL HIGH (ref 65–99)
Potassium: 5.2 mmol/L — ABNORMAL HIGH (ref 3.5–5.1)
Sodium: 139 mmol/L (ref 135–145)

## 2015-03-10 LAB — CBC
HCT: 33 % — ABNORMAL LOW (ref 36.0–46.0)
Hemoglobin: 10.3 g/dL — ABNORMAL LOW (ref 12.0–15.0)
MCH: 27 pg (ref 26.0–34.0)
MCHC: 31.2 g/dL (ref 30.0–36.0)
MCV: 86.4 fL (ref 78.0–100.0)
Platelets: 264 10*3/uL (ref 150–400)
RBC: 3.82 MIL/uL — AB (ref 3.87–5.11)
RDW: 14.7 % (ref 11.5–15.5)
WBC: 10.4 10*3/uL (ref 4.0–10.5)

## 2015-03-10 LAB — GLUCOSE, CAPILLARY: Glucose-Capillary: 153 mg/dL — ABNORMAL HIGH (ref 65–99)

## 2015-03-10 MED ORDER — POLYETHYLENE GLYCOL 3350 17 G PO PACK: 17.0000 g | PACK | Freq: Two times a day (BID) | ORAL | Status: DC

## 2015-03-10 MED ORDER — DOCUSATE SODIUM 100 MG PO CAPS: 100.0000 mg | ORAL_CAPSULE | Freq: Two times a day (BID) | ORAL | Status: DC

## 2015-03-10 MED ORDER — FERROUS SULFATE 325 (65 FE) MG PO TABS: 325.0000 mg | ORAL_TABLET | Freq: Three times a day (TID) | ORAL | Status: DC

## 2015-03-10 MED ORDER — HYDROCODONE-ACETAMINOPHEN 7.5-325 MG PO TABS: 1.0000 | ORAL_TABLET | ORAL | Status: DC | PRN

## 2015-03-10 MED ORDER — TIZANIDINE HCL 4 MG PO TABS: 4.0000 mg | ORAL_TABLET | Freq: Four times a day (QID) | ORAL | Status: DC | PRN

## 2015-03-10 NOTE — Care Management Note (Signed)
Case Management Note  Patient Details  Name: Alexis Hopkins MRN: [id number] Date of Birth: 13-Nov-1945  Subjective/Objective:                   LEFT TOTAL KNEE ARTHROPLASTY (Left)  Action/Plan: Discharge planning  Expected Discharge Date:  03/10/15               Expected Discharge Plan:  Center  In-House Referral:     Discharge planning Services  CM Consult  Post Acute Care Choice:  Home Health Choice offered to:  Patient  DME Arranged:    DME Agency:     HH Arranged:  PT HH Agency:  [facility]  Status of Service:  Completed, signed off  Medicare Important Message Given:    Date Medicare IM Given:    Medicare IM give by:    Date Additional Medicare IM Given:    Additional Medicare Important Message give by:     If discussed at [place] of Stay Meetings, dates discussed:    Additional Comments: CM met with pt in room to offer choice of home health agency.   Pt chooses [facility] to render HHPT.  Address and contact information verified by pt.  Referral emailed to [facility] rep, Tim. Pt has both a rolling walker and commode at home and does not need any other DME.   Dellie Catholic, RN 03/10/2015, 10:58 AM

## 2015-03-10 NOTE — Progress Notes (Signed)
OT Cancellation Note  Patient Details Name: ALAYSHIA MARINI MRN: [id number] DOB: [birth year]   Cancelled Treatment:    Reason Eval/Treat Not Completed: OT screened, no needs identified, will sign off. Pt declines need for OT; states she feels comfortable with ADL techniques from her other knee surgery in Jan this year. Her husband is available to assist and she has her DME. Will sign off.  Jules Schick  [phone] 03/10/2015, 9:09 AM

## 2015-03-10 NOTE — Evaluation (Signed)
Physical Therapy Evaluation Patient Details Name: Alexis Hopkins MRN: [id number] DOB: 27-May-1946 Today's Date: 03/10/2015   History of Present Illness  L TKR; s/p R TKR 1/16  Clinical Impression  Pt s/p L TKR presents with decreased L LE strength/ROM and post op pain limiting functional mobility.  Pt should progress well to dc home with family assist and HHPT follow up.    Follow Up Recommendations Home health PT    Equipment Recommendations  None recommended by PT    Recommendations for Other Services OT consult     Precautions / Restrictions Precautions Precautions: Knee;Fall Restrictions Weight Bearing Restrictions: No Other Position/Activity Restrictions: WBAT      Mobility  Bed Mobility Overal bed mobility: Needs Assistance Bed Mobility: Supine to Sit     Supine to sit: Min guard     General bed mobility comments: min cues for use of R LE to self assist  Transfers Overall transfer level: Needs assistance Equipment used: Rolling walker (2 wheeled) Transfers: Sit to/from Stand Sit to Stand: Min guard         General transfer comment: cues for LE management and use of UEs to self assist  Ambulation/Gait Ambulation/Gait assistance: Min assist;Min guard Ambulation Distance (Feet): 84 Feet Assistive device: Rolling walker (2 wheeled) Gait Pattern/deviations: Step-to pattern;Step-through pattern;Shuffle;Trunk flexed     General Gait Details: cues for posture, position from RW and pace - ltd by onset dizziness  Stairs            Wheelchair Mobility    Modified Rankin (Stroke Patients Only)       Balance                                             Pertinent Vitals/Pain Pain Assessment: 0-10 Pain Score: 5  Pain Location: L knee Pain Descriptors / Indicators: Aching;Sore Pain Intervention(s): Limited activity within patient's tolerance;Monitored during session;Premedicated before session;Ice applied    Home Living  Family/patient expects to be discharged to:: Private residence Living Arrangements: Spouse/significant other Available Help at Discharge: Family Type of Home: House Home Access: Ramped entrance     [place]: One [place]: Environmental consultant - 2 wheels      Prior Function Level of Independence: Independent               Hand Dominance   Dominant Hand: Right    Extremity/Trunk Assessment   Upper Extremity Assessment: Overall WFL for tasks assessed           Lower Extremity Assessment: LLE deficits/detail   LLE Deficits / Details: 3/5 quads with AAROM at knee -10 - 75  Cervical / Trunk Assessment: Normal  Communication   Communication: No difficulties  Cognition Arousal/Alertness: Awake/alert Behavior During Therapy: WFL for tasks assessed/performed Overall Cognitive Status: Within Functional Limits for tasks assessed                      General Comments      Exercises Total Joint Exercises Ankle Circles/Pumps: AROM;Both;15 reps;Supine Quad Sets: AROM;Both;10 reps;Supine Heel Slides: AAROM;Left;15 reps;Supine Straight Leg Raises: AAROM;AROM;Left;15 reps;Supine      Assessment/Plan    PT Assessment Patient needs continued PT services  PT Diagnosis Difficulty walking   PT Problem List Decreased strength;Decreased range of motion;Decreased activity tolerance;Decreased mobility;Decreased knowledge of use of DME;Pain  PT Treatment Interventions DME instruction;Gait training;Functional  mobility training;Therapeutic activities;Therapeutic exercise;Patient/family education   PT Goals (Current goals can be found in the Care Plan section) Acute Rehab PT Goals Patient Stated Goal: HOME PT Goal Formulation: With patient Time For Goal Achievement: 03/13/15 Potential to Achieve Goals: Good    Frequency 7X/week   Barriers to discharge        Co-evaluation               End of Session Equipment Utilized During Treatment: Gait  belt Activity Tolerance: Patient tolerated treatment well Patient left: in chair;with call bell/phone within reach;with family/visitor present Nurse Communication: Mobility status         Time: 8110-3159 PT Time Calculation (min) (ACUTE ONLY): 25 min   Charges:   PT Evaluation $Initial PT Evaluation Tier I: 1 Procedure PT Treatments $Therapeutic Exercise: 8-22 mins   PT G Codes:        Doylene Splinter 04-04-15, 12:32 PM

## 2015-03-10 NOTE — Discharge Instructions (Signed)

## 2015-03-10 NOTE — Anesthesia Postprocedure Evaluation (Signed)
  Anesthesia Post-op Note  Patient: Alexis Hopkins  Procedure(s) Performed: Procedure(s) (LRB): LEFT TOTAL KNEE ARTHROPLASTY (Left)  Patient Location: PACU  Anesthesia Type: Spinal  Level of Consciousness: awake and alert   Airway and Oxygen Therapy: Patient Spontanous Breathing  Post-op Pain: mild  Post-op Assessment: Post-op Vital signs reviewed, Patient's Cardiovascular Status Stable, Respiratory Function Stable, Patent Airway and No signs of Nausea or vomiting  Last Vitals:  Filed Vitals:   03/10/15 0604  BP: 114/58  Pulse: 70  Temp: 36.4 C  Resp: 16    Post-op Vital Signs: stable   Complications: No apparent anesthesia complications

## 2015-03-10 NOTE — Progress Notes (Signed)
Physical Therapy Treatment Patient Details Name: Alexis Hopkins MRN: [id number] DOB: May 26, 1946 Today's Date: 28-Mar-2015    History of Present Illness L TKR; s/p R TKR 1/16    PT Comments    Pt progressing well with mobility.  No c/o dizziness this pm.  Reviewed home therex program.  Follow Up Recommendations  Home health PT     Equipment Recommendations  None recommended by PT    Recommendations for Other Services OT consult     Precautions / Restrictions Precautions Precautions: Knee;Fall Restrictions Weight Bearing Restrictions: No Other Position/Activity Restrictions: WBAT    Mobility  Bed Mobility Overal bed mobility: Modified Independent                Transfers Overall transfer level: Needs assistance Equipment used: Rolling walker (2 wheeled)   Sit to Stand: Supervision         General transfer comment: cues for LE management and use of UEs to self assist  Ambulation/Gait Ambulation/Gait assistance: Min guard Ambulation Distance (Feet): 120 Feet Assistive device: Rolling walker (2 wheeled) Gait Pattern/deviations: Step-to pattern;Step-through pattern;Shuffle;Trunk flexed     General Gait Details: cues for posture, position from RW and pace   Stairs            Wheelchair Mobility    Modified Rankin (Stroke Patients Only)       Balance                                    Cognition Arousal/Alertness: Awake/alert Behavior During Therapy: WFL for tasks assessed/performed Overall Cognitive Status: Within Functional Limits for tasks assessed                      Exercises Total Joint Exercises Ankle Circles/Pumps: AROM;Both;15 reps;Supine Quad Sets: AROM;Both;10 reps;Supine    General Comments        Pertinent Vitals/Pain Pain Assessment: 0-10 Pain Score: 4  Pain Location: L knee Pain Descriptors / Indicators: Aching;Sore Pain Intervention(s): Limited activity within patient's  tolerance;Monitored during session;Premedicated before session;Ice applied    Home Living                      Prior Function            PT Goals (current goals can now be found in the care plan section) Acute Rehab PT Goals Patient Stated Goal: HOME PT Goal Formulation: With patient Time For Goal Achievement: 03/13/15 Potential to Achieve Goals: Good Progress towards PT goals: Progressing toward goals    Frequency  7X/week    PT Plan Current plan remains appropriate    Co-evaluation             End of Session Equipment Utilized During Treatment: Gait belt Activity Tolerance: Patient tolerated treatment well Patient left: in chair;with call bell/phone within reach;with family/visitor present     Time: 4098-1191 PT Time Calculation (min) (ACUTE ONLY): 13 min  Charges:  $Gait Training: 8-22 mins                    G Codes:      Alexis Hopkins 03-28-2015, 2:38 PM

## 2015-03-10 NOTE — Progress Notes (Signed)
     Subjective: 1 Day Post-Op Procedure(s) (LRB): LEFT TOTAL KNEE ARTHROPLASTY (Left)   Seen by Dr. Alvan Dame. Patient reports pain as mild, pain controlled. No events throughout the night. Ready to be discharged home if she does well with PT and pain stays controlled.   Objective:   VITALS:   Filed Vitals:   03/10/15 0604  BP: 114/58  Pulse: 70  Temp: 97.6 F (36.4 C)  Resp: 16    Dorsiflexion/Plantar flexion intact Incision: dressing C/D/I No cellulitis present Compartment soft  LABS  Recent Labs  03/10/15 0445  HGB 10.3*  HCT 33.0*  WBC 10.4  PLT 264     Recent Labs  03/10/15 0445  NA 139  K 5.2*  BUN 26*  CREATININE 1.14*  GLUCOSE 210*     Assessment/Plan: 1 Day Post-Op Procedure(s) (LRB): LEFT TOTAL KNEE ARTHROPLASTY (Left) Foley cath d/c'ed Advance diet Up with therapy D/C IV fluids Discharge home with home health eventually, when ready  Obese (BMI 30-39.9) Estimated body mass index is 34.19 kg/(m^2) as calculated from the following:   Height as of this encounter: 5\' 2"  (1.575 m).   Weight as of this encounter: 84.823 kg (187 lb). Patient also counseled that weight may inhibit the healing process Patient counseled that losing weight will help with future health issues     Alexis Hopkins   PAC  03/10/2015, 8:45 AM

## 2015-03-15 NOTE — Discharge Summary (Signed)
Physician Discharge Summary  Patient ID: Alexis Hopkins MRN: [id number] DOB/AGE: 68-30-1947 68 y.o.  Admit date: 03/09/2015 Discharge date: 03/10/2015   Procedures:  Procedure(s) (LRB): LEFT TOTAL KNEE ARTHROPLASTY (Left)  Attending Physician:  Dr. Paralee Cancel   Admission Diagnoses:   Left knee primary OA / pain  Discharge Diagnoses:  Principal Problem:   S/P left TKA Active Problems:   Obese  Past Medical History  Diagnosis Date  . Hypertension   . Diabetes mellitus without complication   . TIA (transient ischemic attack) 1994    Takes Plavix  . History of kidney stones 40 YRS AGO  . Hyperlipemia   . Arthritis   . Family history of adverse reaction to anesthesia     "mother always was deathly sick"  . Osteoporosis   . Hx of skin cancer, basal cell   . RSD (reflex sympathetic dystrophy)     L ARM  . Cancer     SKIN CANCERS REMOVED  . Anemia     HPI:    Alexis Hopkins, 69 y.o. female, has a history of pain and functional disability in the left knee due to arthritis and has failed non-surgical conservative treatments for greater than 12 weeks to include NSAID's and/or analgesics, corticosteriod injections, viscosupplementation injections, use of assistive devices and activity modification. Onset of symptoms was gradual, starting >10 years ago with gradually worsening course since that time. The patient noted prior procedures on the knee to include arthroplasty on the right knee per Dr. Alvan Dame on 10/05/2014. Patient currently rates pain in the left knee(s) at 10 out of 10 with activity. Patient has night pain, worsening of pain with activity and weight bearing, pain that interferes with activities of daily living, pain with passive range of motion, crepitus and joint swelling. Patient has evidence of periarticular osteophytes and joint space narrowing by imaging studies. There is no active infection. Risks, benefits and expectations were discussed with the patient.  Risks including but not limited to the risk of anesthesia, blood clots, nerve damage, blood vessel damage, failure of the prosthesis, infection and up to and including death. Patient understand the risks, benefits and expectations and wishes to proceed with surgery.   PCP: Mayra Neer, MD   Discharged Condition: good  Hospital Course:  Patient underwent the above stated procedure on 03/09/2015. Patient tolerated the procedure well and brought to the recovery room in good condition and subsequently to the floor.  POD #1 BP: 114/58 ; Pulse: 70 ; Temp: 97.6 F (36.4 C) ; Resp: 16 Patient reports pain as mild, pain controlled. No events throughout the night. Ready to be discharged home. Dorsiflexion/plantar flexion intact, incision: dressing C/D/I, no cellulitis present and compartment soft.   LABS  Basename    HGB  10.3  HCT  33.0    Discharge Exam: General appearance: alert, cooperative and no distress Extremities: Homans sign is negative, no sign of DVT, no edema, redness or tenderness in the calves or thighs and no ulcers, gangrene or trophic changes  Disposition: Home with follow up in 2 weeks   Follow-up Information    Follow up with Mauri Pole, MD. Schedule an appointment as soon as possible for a visit in 2 weeks.   Specialty:  Orthopedic Surgery   Contact information:   [address] [place] [address] [phone]       Follow up with [facility].   Why:  home health physical therapy   Contact information:   [address] [place] [address] [phone]       Discharge Instructions    Call MD / Call 911    Complete by:  As directed   If you experience chest pain or shortness of breath, CALL 911 and be transported to the hospital emergency room.  If you develope a fever above 101 F, pus (white drainage) or increased drainage or redness at the wound, or calf pain, call your surgeon's office.     Change dressing     Complete by:  As directed   Maintain surgical dressing until follow up in the clinic. If the edges start to pull up, may reinforce with tape. If the dressing is no longer working, may remove and cover with gauze and tape, but must keep the area dry and clean.  Call with any questions or concerns.     Constipation Prevention    Complete by:  As directed   Drink plenty of fluids.  Prune juice may be helpful.  You may use a stool softener, such as Colace (over the counter) 100 mg twice a day.  Use MiraLax (over the counter) for constipation as needed.     Diet - low sodium heart healthy    Complete by:  As directed      Discharge instructions    Complete by:  As directed   Maintain surgical dressing until follow up in the clinic. If the edges start to pull up, may reinforce with tape. If the dressing is no longer working, may remove and cover with gauze and tape, but must keep the area dry and clean.  Follow up in 2 weeks at [facility] Call with any questions or concerns.     Increase activity slowly as tolerated    Complete by:  As directed      TED hose    Complete by:  As directed   Use stockings (TED hose) for 2 weeks on both leg(s).  You may remove them at night for sleeping.     Weight bearing as tolerated    Complete by:  As directed   Laterality:  left  Extremity:  Lower             Medication List    STOP taking these medications        oxyCODONE 5 MG immediate release tablet  Commonly known as:  Oxy IR/ROXICODONE     rosuvastatin 10 MG tablet  Commonly known as:  CRESTOR      TAKE these medications        amLODipine 10 MG tablet  Commonly known as:  NORVASC  Take 10 mg by mouth every morning.     aspirin 325 MG tablet  Take 325 mg by mouth every morning.     atorvastatin 40 MG tablet  Commonly known as:  LIPITOR  Take 40 mg by mouth every evening.     calcium-vitamin D 500-200 MG-UNIT per tablet  Commonly known as:  OSCAL WITH D  Take 1 tablet  by mouth every morning.     clopidogrel 75 MG tablet  Commonly known as:  PLAVIX  Take 75 mg by mouth daily with breakfast.     colesevelam 625 MG tablet  Commonly known as:  WELCHOL  Take 1,875 mg by mouth 2 (two) times daily with a meal.     docusate sodium 100 MG capsule  Commonly known as:  COLACE  Take 1 capsule (100 mg total) by mouth  2 (two) times daily.     ferrous sulfate 325 (65 FE) MG tablet  Take 1 tablet (325 mg total) by mouth 3 (three) times daily after meals.     glimepiride 2 MG tablet  Commonly known as:  AMARYL  Take 2 mg by mouth daily with breakfast.     hydrochlorothiazide 25 MG tablet  Commonly known as:  HYDRODIURIL  Take 25 mg by mouth every morning.     HYDROcodone-acetaminophen 7.5-325 MG per tablet  Commonly known as:  NORCO  Take 1-2 tablets by mouth every 4 (four) hours as needed for moderate pain.     lisinopril 40 MG tablet  Commonly known as:  PRINIVIL,ZESTRIL  Take 40 mg by mouth every morning.     metFORMIN 1000 MG tablet  Commonly known as:  GLUCOPHAGE  Take 1,000 mg by mouth 2 (two) times daily with a meal.     multivitamin with minerals Tabs tablet  Take 1 tablet by mouth every morning.     OMEGA 3 PO  Take 2 tablets by mouth 2 (two) times daily. Omega XL     OSTEO BI-FLEX TRIPLE STRENGTH PO  Take 1 tablet by mouth 2 (two) times daily.     polyethylene glycol packet  Commonly known as:  MIRALAX / GLYCOLAX  Take 17 g by mouth 2 (two) times daily.     promethazine 25 MG tablet  Commonly known as:  PHENERGAN  Take 1 tablet (25 mg total) by mouth every 6 (six) hours as needed for nausea or vomiting.     sodium chloride 0.65 % Soln nasal spray  Commonly known as:  OCEAN  Place 1-2 sprays into both nostrils daily as needed for congestion (cold.).     tiZANidine 4 MG tablet  Commonly known as:  ZANAFLEX  Take 1 tablet (4 mg total) by mouth every 6 (six) hours as needed for muscle spasms.         Signed: [place] Pugh.  Seline Enzor   PA-C  03/15/2015, 9:59 AM

## 2015-03-29 ENCOUNTER — Other Ambulatory Visit: Payer: Self-pay

## 2015-04-06 ENCOUNTER — Ambulatory Visit: Payer: Medicare Other | Attending: Orthopedic Surgery | Admitting: Physical Therapy

## 2015-04-06 DIAGNOSIS — M25662 Stiffness of left knee, not elsewhere classified: Secondary | ICD-10-CM | POA: Insufficient documentation

## 2015-04-06 DIAGNOSIS — M25562 Pain in left knee: Secondary | ICD-10-CM | POA: Insufficient documentation

## 2015-04-06 NOTE — Therapy (Signed)
[facility] [place], [place], [address] Phone: [phone]   Fax:  [phone]  Physical Therapy Evaluation  Patient Details  Name: Alexis Hopkins MRN: [id number] Date of Birth: April 04, 1946 Referring Provider:  Paralee Cancel, MD  Encounter Date: 04/06/2015      PT End of Session - 04/06/15 1021    Visit Number 1   Number of Visits 12   Date for PT Re-Evaluation 05/18/15   PT Start Time 0943   PT Stop Time 1029   PT Time Calculation (min) 46 min   Activity Tolerance Patient tolerated treatment well   Behavior During Therapy [facility] for tasks assessed/performed      Past Medical History  Diagnosis Date  . Hypertension   . Diabetes mellitus without complication   . TIA (transient ischemic attack) 1994    Takes Plavix  . History of kidney stones 40 YRS AGO  . Hyperlipemia   . Arthritis   . Family history of adverse reaction to anesthesia     "mother always was deathly sick"  . Osteoporosis   . Hx of skin cancer, basal cell   . RSD (reflex sympathetic dystrophy)     L ARM  . Cancer     SKIN CANCERS REMOVED  . Anemia     Past Surgical History  Procedure Laterality Date  . Rotator cuff repair Right   . Shoulder arthroscopy Right   . Knee arthroscopy      X 1 on right; X 2 on left  . Abdominal hysterectomy  1995  . Reverse shoulder arthroplasty Right 10/03/2013    Procedure: RIGHT REVERSE SHOULDER ARTHROPLASTY;  Surgeon: Augustin Schooling, MD;  Location: [place] [place];  Service: Orthopedics;  Laterality: Right;  . Mohs surgery    . Total knee arthroplasty Right 10/05/2014    Procedure: RIGHT TOTAL KNEE ARTHROPLASTY;  Surgeon: Mauri Pole, MD;  Location: [facility];  Service: Orthopedics;  Laterality: Right;  . Total knee arthroplasty Left 03/09/2015    Procedure: LEFT TOTAL KNEE ARTHROPLASTY;  Surgeon: Paralee Cancel, MD;  Location: [facility];  Service: Orthopedics;  Laterality: Left;    There were no vitals filed for this  visit.  Visit Diagnosis:  Left knee pain  Knee stiffness, left      Subjective Assessment - 04/06/15 1022    Subjective This knee is doing better than the other one did.   Limitations Walking   How long can you walk comfortably? 15-20 minutes.   Patient Stated Goals Return function to left knee without pain.   Currently in Pain? Yes   Pain Score 6    Pain Location Knee   Pain Orientation Left   Pain Descriptors / Indicators Aching;Constant   Pain Type Surgical pain   Pain Onset 1 to 4 weeks ago   Aggravating Factors  Walking.   Pain Relieving Factors Rest and ice.   Multiple Pain Sites No            [facility] PT Assessment - 04/06/15 0001    Assessment   Medical Diagnosis Left total knee replacment.   Onset Date/Surgical Date --  03/09/15.   Precautions   Precautions --  No ultrasound.   Restrictions   Weight Bearing Restrictions No   Balance Screen   Has the patient fallen in the past 6 months No   Has the patient had a decrease in activity level because of a fear of falling?  No   Is the patient  reluctant to leave their home because of a fear of falling?  No   Home Ecologist residence   Prior Function   Level of Independence Independent   Cognition   Overall Cognitive Status Within Functional Limits for tasks assessed   Observation/Other Assessments-Edema    Edema --  1.5 cms > on left atmid-patellar region.   ROM / Strength   AROM / PROM / Strength AROM;PROM;Strength   AROM   Overall AROM Comments -10 degrees to 100 degrees of active left knee ROM.   PROM   Overall PROM Comments -8 degrees to 105 degrees.   Strength   Overall Strength Comments Decreased volitional activation of left quadriceps muscle group with strength graded at 4/5.   Palpation   Palpation comment --  Diffuse anterior left knee pain.   Ambulation/Gait   Gait Comments Mild gait antalgia.                   [facility] Adult PT Treatment/Exercise -  04/06/15 0001    Modalities   Modalities Cryotherapy;Electrical Stimulation   Cryotherapy   Number Minutes Cryotherapy 15 Minutes   Cryotherapy Location --  Left knee.   Acupuncturist Location --  Left knee   Electrical Stimulation Parameters --  80-150 HZ x 15 minutes.   Electrical Stimulation Goals Edema;Pain                  PT Short Term Goals - 04/06/15 1031    PT SHORT TERM GOAL #1   Title Ind with an initial HEP.   Time 2   Period Weeks   Status New           PT Long Term Goals - 04/06/15 1031    PT LONG TERM GOAL #1   Title Ind with an advanced HEP.   Time 6   Period Weeks   Status New   PT LONG TERM GOAL #2   Title Full active left knee extension to normalize gait.   Time 6   Period Weeks   Status New   PT LONG TERM GOAL #3   Title Active left knee flexion to 120 degrees.   Time 6   Period Weeks   Status New   PT LONG TERM GOAL #4   Title Left knee strengh to 5/5 to increase stability for functional tasks.   Time 6   Period Weeks   Status New   PT LONG TERM GOAL #5   Title Perform ADL's with pain not > 2-3/10.   Time 6   Period Weeks   Status New   Additional Long Term Goals   Additional Long Term Goals Yes   PT LONG TERM GOAL #6   Title Walk without antalgia or deviation.   Time 6   Period Weeks   Status New               Plan - 04/06/15 1029    Clinical Impression Statement The patient underwent a left total knee replacment on 03/09/15.  She is very pleased with er progress thus far.  She had 7 HH visits.  Her pain-level todat is rated at 5-6/10.     Pt will benefit from skilled therapeutic intervention in order to improve on the following deficits Pain;Decreased activity tolerance;Decreased strength;Decreased mobility;Abnormal gait;Decreased range of motion   Rehab Potential Excellent   PT Frequency 3x / week   PT Duration 4 weeks  PT Treatment/Interventions ADLs/Self Care Home  Management;Cryotherapy;Occupational psychologist;Therapeutic activities;Therapeutic exercise;Neuromuscular re-education;Patient/family education;Manual techniques;Vasopneumatic Device;Passive range of motion;Scar mobilization   PT Next Visit Plan Total knee replacement protocol.  Achieve full extension early on.   Consulted and Agree with Plan of Care Patient          G-Codes - 2015-04-29 1036    Functional Assessment Tool Used FOTO.   Functional Limitation Mobility: Walking and moving around   Mobility: Walking and Moving Around Current Status [phone]) At least 40 percent but less than 60 percent impaired, limited or restricted   Mobility: Walking and Moving Around Goal Status [phone]) At least 1 percent but less than 20 percent impaired, limited or restricted       Problem List Patient Active Problem List   Diagnosis Date Noted  . Obese 03/10/2015  . S/P left TKA 03/09/2015  . S/P right TKA 10/05/2014  . Arthritis of shoulder region, right, degenerative 10/03/2013    Janaiah Vetrano, [place] MPT 04/29/2015, 10:38 AM  [facility] [address] [place], [place], [address] Phone: [phone]   Fax:  [phone]

## 2015-04-09 ENCOUNTER — Ambulatory Visit: Payer: Medicare Other | Admitting: Physical Therapy

## 2015-04-09 ENCOUNTER — Encounter: Payer: Self-pay | Admitting: Physical Therapy

## 2015-04-09 DIAGNOSIS — M25662 Stiffness of left knee, not elsewhere classified: Secondary | ICD-10-CM

## 2015-04-09 DIAGNOSIS — M25562 Pain in left knee: Secondary | ICD-10-CM

## 2015-04-09 NOTE — Therapy (Signed)
[facility] [place], [place], [address] Phone: [phone]   Fax:  [phone]  Physical Therapy Treatment  Patient Details  Name: Alexis Hopkins MRN: [id number] Date of Birth: Mar 19, 1946 Referring Provider:  Mayra Neer, MD  Encounter Date: 04/09/2015      PT End of Session - 04/09/15 0737    Visit Number 2   Number of Visits 12   Date for PT Re-Evaluation 05/18/15   PT Start Time 0733   PT Stop Time 0820   PT Time Calculation (min) 47 min   Activity Tolerance Patient tolerated treatment well   Behavior During Therapy [facility] for tasks assessed/performed      Past Medical History  Diagnosis Date  . Hypertension   . Diabetes mellitus without complication   . TIA (transient ischemic attack) 1994    Takes Plavix  . History of kidney stones 40 YRS AGO  . Hyperlipemia   . Arthritis   . Family history of adverse reaction to anesthesia     "mother always was deathly sick"  . Osteoporosis   . Hx of skin cancer, basal cell   . RSD (reflex sympathetic dystrophy)     L ARM  . Cancer     SKIN CANCERS REMOVED  . Anemia     Past Surgical History  Procedure Laterality Date  . Rotator cuff repair Right   . Shoulder arthroscopy Right   . Knee arthroscopy      X 1 on right; X 2 on left  . Abdominal hysterectomy  1995  . Reverse shoulder arthroplasty Right 10/03/2013    Procedure: RIGHT REVERSE SHOULDER ARTHROPLASTY;  Surgeon: Augustin Schooling, MD;  Location: [place];  Service: Orthopedics;  Laterality: Right;  . Mohs surgery    . Total knee arthroplasty Right 10/05/2014    Procedure: RIGHT TOTAL KNEE ARTHROPLASTY;  Surgeon: Mauri Pole, MD;  Location: [facility];  Service: Orthopedics;  Laterality: Right;  . Total knee arthroplasty Left 03/09/2015    Procedure: LEFT TOTAL KNEE ARTHROPLASTY;  Surgeon: Paralee Cancel, MD;  Location: [facility];  Service: Orthopedics;  Laterality: Left;    There were no vitals filed for this  visit.  Visit Diagnosis:  Left knee pain  Knee stiffness, left      Subjective Assessment - 04/09/15 0735    Subjective Reports stiffness in the LLE and she reports that the RLE continues to have some stiffness. Reports that she has been riding a bike for 15 min and completing exercises that [facility] gave her. Reports some spasms in posterior L leg when sitting if material touched posterior lknee.   Limitations Walking   How long can you walk comfortably? 15-20 minutes.   Patient Stated Goals Return function to left knee without pain.   Currently in Pain? Yes   Pain Score 4    Pain Location Knee   Pain Orientation Left   Pain Descriptors / Indicators Other (Comment)  Stiffness   Pain Type Surgical pain   Pain Onset 1 to 4 weeks ago            [facility] PT Assessment - 04/09/15 0001    Assessment   Medical Diagnosis Left total knee replacment.   Onset Date/Surgical Date 03/09/15   Next MD Visit 04/28/2015   ROM / Strength   AROM / PROM / Strength AROM;PROM   AROM   Overall AROM  Deficits   AROM Assessment Site Knee   Right/Left Knee Left  Left Knee Extension 3   Left Knee Flexion 101   PROM   Overall PROM  Deficits   PROM Assessment Site Knee   Right/Left Knee Left   Left Knee Extension 0   Left Knee Flexion 106   Palpation   Patella mobility Good L patellar mobility in L/R, tilts; slightly diminished sup/inf                     [facility] Adult PT Treatment/Exercise - 04/09/15 0001    Exercises   Exercises Knee/Hip   Knee/Hip Exercises: Stretches   Active Hamstring Stretch Left;3 reps;30 seconds   Gastroc Stretch Both;3 reps;30 seconds  Rockerboard as slant board   Knee/Hip Exercises: Aerobic   Nustep L4, seat 9 x 8 min   Knee/Hip Exercises: Standing   Forward Step Up Left;2 sets;10 reps;Hand Hold: 2;Step Height: 6"   Modalities   Modalities Cryotherapy;Electrical Stimulation   Cryotherapy   Number Minutes Cryotherapy 15 Minutes   Cryotherapy  Location Knee   Type of Cryotherapy Ice pack   Electrical Stimulation   Electrical Stimulation Location L knee    Electrical Stimulation Action IFC   Electrical Stimulation Parameters 1-10 Hz x15 min   Electrical Stimulation Goals Edema;Pain   Manual Therapy   Manual Therapy Passive ROM;Soft tissue mobilization   Soft tissue mobilization L patellar mobilizations into sup/inf, L/R, tilts   Passive ROM L knee into flex/ext with gentle holds at end range                PT Education - 04/09/15 0817    Education provided Yes   Education Details HEP- heel slides, siting hamstring stretch   Person(s) Educated Patient   Methods Explanation;Demonstration;Handout;Verbal cues   Comprehension Verbalized understanding;Returned demonstration;Verbal cues required          PT Short Term Goals - 04/06/15 1031    PT SHORT TERM GOAL #1   Title Ind with an initial HEP.   Time 2   Period Weeks   Status New           PT Long Term Goals - 04/06/15 1031    PT LONG TERM GOAL #1   Title Ind with an advanced HEP.   Time 6   Period Weeks   Status New   PT LONG TERM GOAL #2   Title Full active left knee extension to normalize gait.   Time 6   Period Weeks   Status New   PT LONG TERM GOAL #3   Title Active left knee flexion to 120 degrees.   Time 6   Period Weeks   Status New   PT LONG TERM GOAL #4   Title Left knee strengh to 5/5 to increase stability for functional tasks.   Time 6   Period Weeks   Status New   PT LONG TERM GOAL #5   Title Perform ADL's with pain not > 2-3/10.   Time 6   Period Weeks   Status New   Additional Long Term Goals   Additional Long Term Goals Yes   PT LONG TERM GOAL #6   Title Walk without antalgia or deviation.   Time 6   Period Weeks   Status New               Plan - 04/09/15 5397    Clinical Impression Statement Patient tolerated treatment well today without complaint of pain during exercises. Was given initial HEP and  accepted without questions.  All goals remain on-going secondary to decreased L knee ROM, strength, pain and deviation with ambulation. Good L patellar mobility in L/R, tilits but slightly diminished in sup/inf direction. Normal modalties response noted following removal of the modalities. Experienced 1/10 pain following treatment.   Pt will benefit from skilled therapeutic intervention in order to improve on the following deficits Pain;Decreased activity tolerance;Decreased strength;Decreased mobility;Abnormal gait;Decreased range of motion   Rehab Potential Excellent   PT Frequency 3x / week   PT Duration 4 weeks   PT Treatment/Interventions ADLs/Self Care Home Management;Cryotherapy;Occupational psychologist;Therapeutic activities;Therapeutic exercise;Neuromuscular re-education;Patient/family education;Manual techniques;Vasopneumatic Device;Passive range of motion;Scar mobilization   PT Next Visit Plan Continue per MPT POC and TKR protocol. Attempt to achieve full extension early in PT.   Consulted and Agree with Plan of Care Patient        Problem List Patient Active Problem List   Diagnosis Date Noted  . Obese 03/10/2015  . S/P left TKA 03/09/2015  . S/P right TKA 10/05/2014  . Arthritis of shoulder region, right, degenerative 10/03/2013    Wynelle Fanny, PTA 04/09/2015, 8:28 AM  [facility] [place], [place], [address] Phone: [phone]   Fax:  [phone]

## 2015-04-09 NOTE — Patient Instructions (Signed)
PROM: Knee Flexion   With towel around left heel, gently pull knee up with towel until stretch is felt. Hold _10___ seconds. Repeat _10___ times per set. Do _1___ sets per session. Do _2-3___ sessions per day.  http://orth.exer.us/672   Copyright  VHI. All rights reserved.  Stretching: Hamstring (Sitting)   With right leg straight, tuck other foot near groin. Reach down until stretch is felt in back of thigh. Keep back straight. Hold _30___ seconds. Repeat _3___ times per set. Do __1__ sets per session. Do _2-3___ sessions per day.  http://orth.exer.us/660   Copyright  VHI. All rights reserved.

## 2015-04-13 ENCOUNTER — Ambulatory Visit: Payer: Medicare Other | Admitting: Physical Therapy

## 2015-04-13 ENCOUNTER — Encounter: Payer: Self-pay | Admitting: Physical Therapy

## 2015-04-13 DIAGNOSIS — M25662 Stiffness of left knee, not elsewhere classified: Secondary | ICD-10-CM

## 2015-04-13 DIAGNOSIS — M25562 Pain in left knee: Secondary | ICD-10-CM | POA: Diagnosis not present

## 2015-04-13 NOTE — Therapy (Signed)
[facility] [place], [place], [address] Phone: [phone]   Fax:  [phone]  Physical Therapy Treatment  Patient Details  Name: Alexis Hopkins MRN: [id number] Date of Birth: Oct 04, 1945 Referring Provider:  Mayra Neer, MD  Encounter Date: 04/13/2015      PT End of Session - 04/13/15 0732    Visit Number 3   Number of Visits 12   Date for PT Re-Evaluation 05/18/15   PT Start Time 0730   PT Stop Time 0822   PT Time Calculation (min) 52 min   Activity Tolerance Patient tolerated treatment well   Behavior During Therapy [facility] for tasks assessed/performed      Past Medical History  Diagnosis Date  . Hypertension   . Diabetes mellitus without complication   . TIA (transient ischemic attack) 1994    Takes Plavix  . History of kidney stones 40 YRS AGO  . Hyperlipemia   . Arthritis   . Family history of adverse reaction to anesthesia     "mother always was deathly sick"  . Osteoporosis   . Hx of skin cancer, basal cell   . RSD (reflex sympathetic dystrophy)     L ARM  . Cancer     SKIN CANCERS REMOVED  . Anemia     Past Surgical History  Procedure Laterality Date  . Rotator cuff repair Right   . Shoulder arthroscopy Right   . Knee arthroscopy      X 1 on right; X 2 on left  . Abdominal hysterectomy  1995  . Reverse shoulder arthroplasty Right 10/03/2013    Procedure: RIGHT REVERSE SHOULDER ARTHROPLASTY;  Surgeon: Augustin Schooling, MD;  Location: [facility];  Service: Orthopedics;  Laterality: Right;  . Mohs surgery    . Total knee arthroplasty Right 10/05/2014    Procedure: RIGHT TOTAL KNEE ARTHROPLASTY;  Surgeon: Mauri Pole, MD;  Location[facility];  Service: Orthopedics;  Laterality: Right;  . Total knee arthroplasty Left 03/09/2015    Procedure: LEFT TOTAL KNEE ARTHROPLASTY;  Surgeon: Paralee Cancel, MD;  Location: [facility];  Service: Orthopedics;  Laterality: Left;    There were no vitals filed for this  visit.  Visit Diagnosis:  Left knee pain  Knee stiffness, left      Subjective Assessment - 04/13/15 0731    Subjective Reports some stiffness this morning. [place] and used a cane and pillow to keep knee extended during the drive and reported not much soreness.   Limitations Walking   How long can you walk comfortably? 15-20 minutes.   Patient Stated Goals Return function to left knee without pain.   Currently in Pain? Yes   Pain Score 3    Pain Location Knee   Pain Orientation Left   Pain Descriptors / Indicators Other (Comment);Sore  Stiffness   Pain Type Surgical pain   Pain Onset 1 to 4 weeks ago   Pain Frequency Intermittent            [facility] PT Assessment - 04/13/15 0001    Assessment   Medical Diagnosis Left total knee replacment.   Onset Date/Surgical Date 03/09/15   Next MD Visit 04/28/2015   AROM   Overall AROM  Deficits   AROM Assessment Site Knee   Right/Left Knee Left   Left Knee Extension 3   Left Knee Flexion 104  [facility] Adult PT Treatment/Exercise - 04/13/15 0001    Knee/Hip Exercises: Stretches   Active Hamstring Stretch Left;3 reps;30 seconds   Gastroc Stretch Both;3 reps;30 seconds   Knee/Hip Exercises: Aerobic   Stationary Bike Seat 5 x8 min   Knee/Hip Exercises: Standing   Forward Lunges Left;2 sets;10 reps;2 seconds;Other (comment)  on 14" step   Lateral Step Up Left;2 sets;10 reps;Hand Hold: 2;Step Height: 6"   Forward Step Up Left;2 sets;10 reps;Hand Hold: 2;Step Height: 6"   Modalities   Modalities Designer, multimedia Location L knee    Electrical Stimulation Action IFC   Electrical Stimulation Parameters 1-10 Hz x15 min   Electrical Stimulation Goals Pain   Vasopneumatic   Number Minutes Vasopneumatic  15 minutes   Vasopnuematic Location  Knee   Vasopneumatic Pressure Medium   Manual Therapy    Manual Therapy Passive ROM;Soft tissue mobilization   Soft tissue mobilization L patellar mobilizations into sup/inf, L/R, tilts   Passive ROM L knee into flex/ext with gentle holds at end range                  PT Short Term Goals - 04/13/15 8921    PT SHORT TERM GOAL #1   Title Ind with an initial HEP.   Time 2   Period Weeks   Status Achieved           PT Long Term Goals - 04/13/15 1941    PT LONG TERM GOAL #1   Title Ind with an advanced HEP.   Time 6   Period Weeks   Status On-going   PT LONG TERM GOAL #2   Title Full active left knee extension to normalize gait.   Time 6   Period Weeks   Status On-going   PT LONG TERM GOAL #3   Title Active left knee flexion to 120 degrees.   Time 6   Period Weeks   Status On-going   PT LONG TERM GOAL #4   Title Left knee strengh to 5/5 to increase stability for functional tasks.   Time 6   Period Weeks   Status On-going   PT LONG TERM GOAL #5   Title Perform ADL's with pain not > 2-3/10.   Time 6   Period Weeks   Status On-going   PT LONG TERM GOAL #6   Title Walk without antalgia or deviation.   Time 6   Period Weeks   Status On-going               Plan - 04/13/15 0810    Clinical Impression Statement Patient tolerated treatment well today without complaint of pain during exercises. Achieved ST goal in I with intial HEP today in clinic. AROM of L knee measured as 3-104 deg today. Normal modalities response noted following removal of the modalties. Denied pain following treatment but reported still feeling a little stiffness.   Pt will benefit from skilled therapeutic intervention in order to improve on the following deficits Pain;Decreased activity tolerance;Decreased strength;Decreased mobility;Abnormal gait;Decreased range of motion   Rehab Potential Excellent   PT Frequency 3x / week   PT Duration 4 weeks   PT Treatment/Interventions ADLs/Self Care Home Management;Cryotherapy;Manufacturing engineer;Therapeutic activities;Therapeutic exercise;Neuromuscular re-education;Patient/family education;Manual techniques;Vasopneumatic Device;Passive range of motion;Scar mobilization   PT Next Visit Plan Continue per MPT POC and TKR protocol. Attempt to achieve full extension early in PT.   Consulted and Agree with Plan of  Care Patient        Problem List Patient Active Problem List   Diagnosis Date Noted  . Obese 03/10/2015  . S/P left TKA 03/09/2015  . S/P right TKA 10/05/2014  . Arthritis of shoulder region, right, degenerative 10/03/2013    Wynelle Fanny, PTA 04/13/2015, 8:25 AM  [facility] [address] [place], [place], [address] Phone: [phone]   Fax:  [phone]

## 2015-04-15 ENCOUNTER — Ambulatory Visit: Payer: Medicare Other | Admitting: Physical Therapy

## 2015-04-15 ENCOUNTER — Encounter: Payer: Self-pay | Admitting: Physical Therapy

## 2015-04-15 DIAGNOSIS — M25562 Pain in left knee: Secondary | ICD-10-CM

## 2015-04-15 DIAGNOSIS — M25662 Stiffness of left knee, not elsewhere classified: Secondary | ICD-10-CM

## 2015-04-15 NOTE — Therapy (Signed)
[facility] [place], [place], [address] Phone: [phone]   Fax:  [phone]  Physical Therapy Treatment  Patient Details  Name: LENEE FRANZE MRN: [id number] Date of Birth: June 03, 1946 Referring Provider:  Mayra Neer, MD  Encounter Date: 04/15/2015      PT End of Session - 04/15/15 0733    Visit Number 4   Number of Visits 12   Date for PT Re-Evaluation 05/18/15   PT Start Time 0730   PT Stop Time 0820   PT Time Calculation (min) 50 min   Activity Tolerance Patient tolerated treatment well   Behavior During Therapy [facility] for tasks assessed/performed      Past Medical History  Diagnosis Date  . Hypertension   . Diabetes mellitus without complication   . TIA (transient ischemic attack) 1994    Takes Plavix  . History of kidney stones 40 YRS AGO  . Hyperlipemia   . Arthritis   . Family history of adverse reaction to anesthesia     "mother always was deathly sick"  . Osteoporosis   . Hx of skin cancer, basal cell   . RSD (reflex sympathetic dystrophy)     L ARM  . Cancer     SKIN CANCERS REMOVED  . Anemia     Past Surgical History  Procedure Laterality Date  . Rotator cuff repair Right   . Shoulder arthroscopy Right   . Knee arthroscopy      X 1 on right; X 2 on left  . Abdominal hysterectomy  1995  . Reverse shoulder arthroplasty Right 10/03/2013    Procedure: RIGHT REVERSE SHOULDER ARTHROPLASTY;  Surgeon: Augustin Schooling, MD;  Location: [place];  Service: Orthopedics;  Laterality: Right;  . Mohs surgery    . Total knee arthroplasty Right 10/05/2014    Procedure: RIGHT TOTAL KNEE ARTHROPLASTY;  Surgeon: Mauri Pole, MD;  Location: [facility];  Service: Orthopedics;  Laterality: Right;  . Total knee arthroplasty Left 03/09/2015    Procedure: LEFT TOTAL KNEE ARTHROPLASTY;  Surgeon: Paralee Cancel, MD;  Location: [facility];  Service: Orthopedics;  Laterality: Left;    There were no vitals filed for this  visit.  Visit Diagnosis:  Left knee pain  Knee stiffness, left      Subjective Assessment - 04/15/15 0732    Subjective Reports some stiffness and soreness this morning. Stated that yesterday she went to visit her nephew in the hospital and walked from the handicap parking to the 6th floor and prolonged walking makes her knee hurt some.   Limitations Walking   How long can you walk comfortably? 15-20 minutes.   Patient Stated Goals Return function to left knee without pain.   Currently in Pain? Yes   Pain Score 2    Pain Location Knee   Pain Orientation Left   Pain Descriptors / Indicators Other (Comment);Sore  Stiffness   Pain Type Surgical pain   Pain Onset 1 to 4 weeks ago            [facility] PT Assessment - 04/15/15 0001    Assessment   Medical Diagnosis Left total knee replacment.   Onset Date/Surgical Date 03/09/15   Next MD Visit 04/28/2015   ROM / Strength   AROM / PROM / Strength AROM   AROM   Overall AROM  Deficits   AROM Assessment Site Knee   Right/Left Knee Left   Left Knee Extension 0   Left Knee Flexion 108  [facility] PT Treatment/Exercise - 04/15/15 0001    Knee/Hip Exercises: Stretches   Active Hamstring Stretch Left;3 reps;30 seconds   Gastroc Stretch Both;3 reps;30 seconds  Slant board stretch   Knee/Hip Exercises: Aerobic   Stationary Bike L1 x8 min   Knee/Hip Exercises: Standing   Forward Lunges Left;2 sets;10 reps;2 seconds;Other (comment)  on 14" step   Lateral Step Up Left;2 sets;10 reps;Hand Hold: 2;Step Height: 6"   Forward Step Up Left;2 sets;10 reps;Hand Hold: 1;Step Height: 6"  RUE use only   Modalities   Modalities Electrical Stimulation;Vasopneumatic   Electrical Stimulation   Electrical Stimulation Location L knee    Electrical Stimulation Action IFC   Electrical Stimulation Parameters 1-10 hz x15 min   Electrical Stimulation Goals Pain   Vasopneumatic   Number Minutes Vasopneumatic  15  minutes   Vasopnuematic Location  Knee   Vasopneumatic Pressure Medium   Vasopneumatic Temperature  48   Manual Therapy   Manual Therapy Passive ROM;Soft tissue mobilization   Soft tissue mobilization L patellar mobilizations into sup/inf, L/R, tilts; L knee inicision scar mobilizations   Passive ROM L knee into flex/ext with gentle holds at end range                  PT Short Term Goals - 04/13/15 4235    PT SHORT TERM GOAL #1   Title Ind with an initial HEP.   Time 2   Period Weeks   Status Achieved           PT Long Term Goals - 04/15/15 0805    PT LONG TERM GOAL #1   Title Ind with an advanced HEP.   Time 6   Period Weeks   Status On-going   PT LONG TERM GOAL #2   Title Full active left knee extension to normalize gait.   Time 6   Period Weeks   Status Achieved  AROM L knee 0 deg ext as of 04/15/2015   PT LONG TERM GOAL #3   Title Active left knee flexion to 120 degrees.   Time 6   Period Weeks   Status On-going   PT LONG TERM GOAL #4   Title Left knee strengh to 5/5 to increase stability for functional tasks.   Time 6   Period Weeks   Status On-going   PT LONG TERM GOAL #5   Title Perform ADL's with pain not > 2-3/10.   Time 6   Period Weeks   Status Achieved   PT LONG TERM GOAL #6   Title Walk without antalgia or deviation.   Time 6   Period Weeks   Status On-going               Plan - 04/15/15 0809    Clinical Impression Statement Patient continues to progress in therapy regarding ROM. Achieved full extension goal today in clinic. AROM of the L knee was measured as 0-108 deg in supine today. Able to complete all stretches and activities without verbalizing pain. Normal modalties response noted following removal of the modalties. Experienced 3/10 soreness following treatment.   Pt will benefit from skilled therapeutic intervention in order to improve on the following deficits Pain;Decreased activity tolerance;Decreased  strength;Decreased mobility;Abnormal gait;Decreased range of motion   Rehab Potential Excellent   PT Frequency 3x / week   PT Duration 4 weeks   PT Treatment/Interventions ADLs/Self Care Home Management;Cryotherapy;Occupational psychologist;Therapeutic activities;Therapeutic exercise;Neuromuscular re-education;Patient/family education;Manual techniques;Vasopneumatic Device;Passive range of motion;Scar mobilization  PT Next Visit Plan Continue per MPT POC and TKR protocol.    Consulted and Agree with Plan of Care Patient        Problem List Patient Active Problem List   Diagnosis Date Noted  . Obese 03/10/2015  . S/P left TKA 03/09/2015  . S/P right TKA 10/05/2014  . Arthritis of shoulder region, right, degenerative 10/03/2013    Wynelle Fanny, PTA 04/15/2015, 8:24 AM  [facility] [place], [place], [address] Phone: [phone]   Fax:  [phone]

## 2015-04-20 ENCOUNTER — Encounter: Payer: Self-pay | Admitting: Physical Therapy

## 2015-04-20 ENCOUNTER — Ambulatory Visit: Payer: Medicare Other | Admitting: Physical Therapy

## 2015-04-20 DIAGNOSIS — M25562 Pain in left knee: Secondary | ICD-10-CM

## 2015-04-20 DIAGNOSIS — M25662 Stiffness of left knee, not elsewhere classified: Secondary | ICD-10-CM

## 2015-04-20 NOTE — Therapy (Signed)
[facility] [place], [place], [address] Phone: [phone]   Fax:  [phone]  Physical Therapy Treatment  Patient Details  Name: Alexis Hopkins MRN: [id number] Date of Birth: [birth year] Referring Provider:  Mayra Neer, MD  Encounter Date: 04/20/2015      PT End of Session - 04/20/15 0735    Visit Number 5   Number of Visits 12   Date for PT Re-Evaluation 05/18/15   PT Start Time 0730   PT Stop Time 0818   PT Time Calculation (min) 48 min   Activity Tolerance Patient tolerated treatment well   Behavior During Therapy [facility] for tasks assessed/performed      Past Medical History  Diagnosis Date  . Hypertension   . Diabetes mellitus without complication   . TIA (transient ischemic attack) 1994    Takes Plavix  . History of kidney stones 40 YRS AGO  . Hyperlipemia   . Arthritis   . Family history of adverse reaction to anesthesia     "mother always was deathly sick"  . Osteoporosis   . Hx of skin cancer, basal cell   . RSD (reflex sympathetic dystrophy)     L ARM  . Cancer     SKIN CANCERS REMOVED  . Anemia     Past Surgical History  Procedure Laterality Date  . Rotator cuff repair Right   . Shoulder arthroscopy Right   . Knee arthroscopy      X 1 on right; X 2 on left  . Abdominal hysterectomy  1995  . Reverse shoulder arthroplasty Right 10/03/2013    Procedure: RIGHT REVERSE SHOULDER ARTHROPLASTY;  Surgeon: Augustin Schooling, MD;  Location: [place];  Service: Orthopedics;  Laterality: Right;  . Mohs surgery    . Total knee arthroplasty Right 10/05/2014    Procedure: RIGHT TOTAL KNEE ARTHROPLASTY;  Surgeon: Mauri Pole, MD;  Location: [facility];  Service: Orthopedics;  Laterality: Right;  . Total knee arthroplasty Left 03/09/2015    Procedure: LEFT TOTAL KNEE ARTHROPLASTY;  Surgeon: Paralee Cancel, MD;  Location: [facility];  Service: Orthopedics;  Laterality: Left;    There were no vitals filed for this  visit.  Visit Diagnosis:  Left knee pain  Knee stiffness, left      Subjective Assessment - 04/20/15 0733    Subjective Reports that she had stiffness in bilateral knees as well as L ankle this weekend (8/10). Is fearful of RSD reoccuring like it did with her wrist injury. States that when knee begins throbbing and the pain medications do not affect it. States that she used heat this morning prior to therapy to see if it would help with the stiffness.   Limitations Walking   How long can you walk comfortably? 15-20 minutes.   Patient Stated Goals Return function to left knee without pain.   Currently in Pain? Yes   Pain Score 7    Pain Location Knee   Pain Orientation Left   Pain Descriptors / Indicators Other (Comment)  Stiffness   Pain Type Surgical pain   Pain Onset 1 to 4 weeks ago            [facility] PT Assessment - 04/20/15 0001    Assessment   Medical Diagnosis Left total knee replacment.   Onset Date/Surgical Date 03/09/15   Next MD Visit 04/28/2015                     [facility]  Adult PT Treatment/Exercise - 04/20/15 0001    Knee/Hip Exercises: Aerobic   Stationary Bike L1 x10 minutes   Knee/Hip Exercises: Standing   Forward Lunges Left;2 sets;10 reps;2 seconds;Other (comment)  on 14" step   Lateral Step Up Left;2 sets;10 reps;Hand Hold: 2;Step Height: 6"   Forward Step Up Left;2 sets;10 reps;Hand Hold: 1;Step Height: 6"  RUE use only   Rocker Board 3 minutes   Modalities   Modalities Designer, multimedia Location L knee    Electrical Stimulation Action IFC   Electrical Stimulation Parameters 1-10 Hz x15 min   Electrical Stimulation Goals Pain   Vasopneumatic   Number Minutes Vasopneumatic  15 minutes   Vasopnuematic Location  Knee   Vasopneumatic Pressure Medium   Manual Therapy   Manual Therapy Passive ROM;Soft tissue mobilization   Soft tissue mobilization L patellar  mobilizations into sup/inf, L/R, tilts; L knee inicision scar mobilizations   Passive ROM L knee into flex/ext with gentle holds at end range                  PT Short Term Goals - 04/13/15 4270    PT SHORT TERM GOAL #1   Title Ind with an initial HEP.   Time 2   Period Weeks   Status Achieved           PT Long Term Goals - 04/15/15 0805    PT LONG TERM GOAL #1   Title Ind with an advanced HEP.   Time 6   Period Weeks   Status On-going   PT LONG TERM GOAL #2   Title Full active left knee extension to normalize gait.   Time 6   Period Weeks   Status Achieved  AROM L knee 0 deg ext as of 04/15/2015   PT LONG TERM GOAL #3   Title Active left knee flexion to 120 degrees.   Time 6   Period Weeks   Status On-going   PT LONG TERM GOAL #4   Title Left knee strengh to 5/5 to increase stability for functional tasks.   Time 6   Period Weeks   Status On-going   PT LONG TERM GOAL #5   Title Perform ADL's with pain not > 2-3/10.   Time 6   Period Weeks   Status Achieved   PT LONG TERM GOAL #6   Title Walk without antalgia or deviation.   Time 6   Period Weeks   Status On-going               Plan - 04/20/15 6237    Clinical Impression Statement Patient continues to tolerate treatment well without complaint of increased pain. Firm end feels continue to be noted in PROM flexion of the L knee. Normal modalities response noted following removal of the modalties. Experienced "a little bit" of tightness following treatment. Current limitation is 36% at the 5th visit.   Pt will benefit from skilled therapeutic intervention in order to improve on the following deficits Pain;Decreased activity tolerance;Decreased strength;Decreased mobility;Abnormal gait;Decreased range of motion   Rehab Potential Excellent   PT Frequency 3x / week   PT Duration 4 weeks   PT Treatment/Interventions ADLs/Self Care Home Management;Cryotherapy;Banker;Therapeutic activities;Therapeutic exercise;Neuromuscular re-education;Patient/family education;Manual techniques;Vasopneumatic Device;Passive range of motion;Scar mobilization   PT Next Visit Plan Continue per MPT POC and TKR protocol.    Consulted and Agree with Plan of Care Patient  Problem List Patient Active Problem List   Diagnosis Date Noted  . Obese 03/10/2015  . S/P left TKA 03/09/2015  . S/P right TKA 10/05/2014  . Arthritis of shoulder region, right, degenerative 10/03/2013    Wynelle Fanny, PTA 04/20/2015, 8:44 AM  [facility] [place], [place], [address] Phone: [phone]   Fax:  [phone]

## 2015-04-23 ENCOUNTER — Ambulatory Visit: Payer: Medicare Other | Admitting: Physical Therapy

## 2015-04-23 ENCOUNTER — Encounter: Payer: Self-pay | Admitting: Physical Therapy

## 2015-04-23 DIAGNOSIS — M25662 Stiffness of left knee, not elsewhere classified: Secondary | ICD-10-CM

## 2015-04-23 DIAGNOSIS — M25562 Pain in left knee: Secondary | ICD-10-CM | POA: Diagnosis not present

## 2015-04-23 NOTE — Patient Instructions (Signed)
Forward Lunge   Standing with feet shoulder width apart and stomach tight, step forward with left leg. Repeat _10___ times per set. Do _2-3___ sets per session. Do _2-3___ sessions per day.  http://orth.exer.us/1146   Copyright  VHI. All rights reserved.

## 2015-04-23 NOTE — Therapy (Signed)
[facility] [place], [place], [address] Phone: [phone]   Fax:  [phone]  Physical Therapy Treatment  Patient Details  Name: Alexis Hopkins MRN: [id number] Date of Birth: January 06, 1946 Referring Provider:  Mayra Neer, MD  Encounter Date: 04/23/2015      PT End of Session - 04/23/15 0732    Visit Number 6   Number of Visits 12   Date for PT Re-Evaluation 05/18/15   PT Start Time 0729   PT Stop Time 0822   PT Time Calculation (min) 53 min   Activity Tolerance Patient tolerated treatment well   Behavior During Therapy [facility] for tasks assessed/performed      Past Medical History  Diagnosis Date  . Hypertension   . Diabetes mellitus without complication   . TIA (transient ischemic attack) 1994    Takes Plavix  . History of kidney stones 40 YRS AGO  . Hyperlipemia   . Arthritis   . Family history of adverse reaction to anesthesia     "mother always was deathly sick"  . Osteoporosis   . Hx of skin cancer, basal cell   . RSD (reflex sympathetic dystrophy)     L ARM  . Cancer     SKIN CANCERS REMOVED  . Anemia     Past Surgical History  Procedure Laterality Date  . Rotator cuff repair Right   . Shoulder arthroscopy Right   . Knee arthroscopy      X 1 on right; X 2 on left  . Abdominal hysterectomy  1995  . Reverse shoulder arthroplasty Right 10/03/2013    Procedure: RIGHT REVERSE SHOULDER ARTHROPLASTY;  Surgeon: Augustin Schooling, MD;  Location: [place];  Service: Orthopedics;  Laterality: Right;  . Mohs surgery    . Total knee arthroplasty Right 10/05/2014    Procedure: RIGHT TOTAL KNEE ARTHROPLASTY;  Surgeon: Mauri Pole, MD;  Location: [facility];  Service: Orthopedics;  Laterality: Right;  . Total knee arthroplasty Left 03/09/2015    Procedure: LEFT TOTAL KNEE ARTHROPLASTY;  Surgeon: Paralee Cancel, MD;  Location[facility];  Service: Orthopedics;  Laterality: Left;    There were no vitals filed for this  visit.  Visit Diagnosis:  Left knee pain  Knee stiffness, left      Subjective Assessment - 04/23/15 0730    Subjective Reports that she has some stiffness this morning.   Limitations Walking   How long can you walk comfortably? 15-20 minutes.   Patient Stated Goals Return function to left knee without pain.   Currently in Pain? Yes   Pain Score 4    Pain Location Knee   Pain Orientation Left   Pain Descriptors / Indicators Other (Comment)  Stiffness   Pain Type Surgical pain   Pain Onset 1 to 4 weeks ago            [facility] PT Assessment - 04/23/15 0001    Assessment   Medical Diagnosis Left total knee replacment.   Onset Date/Surgical Date 03/09/15   Next MD Visit 04/28/2015   AROM   Overall AROM  Deficits   AROM Assessment Site Knee   Right/Left Knee Left   Left Knee Extension 0   Left Knee Flexion 110                     [facility] Adult PT Treatment/Exercise - 04/23/15 0001    Knee/Hip Exercises: Aerobic   Stationary Bike L1 x10 minutes  Knee/Hip Exercises: Standing   Forward Lunges Left;2 sets;10 reps;2 seconds;Other (comment)  on 14" step   Lateral Step Up Left;3 sets;10 reps;Hand Hold: 2;Step Height: 6"   Forward Step Up Left;3 sets;10 reps;Hand Hold: 1;Step Height: 6";Other (comment)  RUE only   Rocker Board 3 minutes   Modalities   Modalities Designer, multimedia Location L knee    Electrical Stimulation Action IFC   Electrical Stimulation Parameters 1-10 Hz x15 min   Electrical Stimulation Goals Pain   Vasopneumatic   Number Minutes Vasopneumatic  15 minutes   Vasopnuematic Location  Knee   Vasopneumatic Pressure Low   Manual Therapy   Manual Therapy Passive ROM;Soft tissue mobilization   Soft tissue mobilization L patellar mobilizations into sup/inf, L/R, tilts; L knee inicision scar mobilizations   Passive ROM L knee into flex/ext with gentle holds at end range                 PT Education - 04/23/15 0825    Education provided Yes   Education Details HEP- forward lunges on step; begin with 20 reps but could progress to 30 reps when 20 become easier.   Person(s) Educated Patient   Methods Explanation;Verbal cues;Handout   Comprehension Verbalized understanding;Verbal cues required          PT Short Term Goals - 04/13/15 0733    PT SHORT TERM GOAL #1   Title Ind with an initial HEP.   Time 2   Period Weeks   Status Achieved           PT Long Term Goals - 04/15/15 0805    PT LONG TERM GOAL #1   Title Ind with an advanced HEP.   Time 6   Period Weeks   Status On-going   PT LONG TERM GOAL #2   Title Full active left knee extension to normalize gait.   Time 6   Period Weeks   Status Achieved  AROM L knee 0 deg ext as of 04/15/2015   PT LONG TERM GOAL #3   Title Active left knee flexion to 120 degrees.   Time 6   Period Weeks   Status On-going   PT LONG TERM GOAL #4   Title Left knee strengh to 5/5 to increase stability for functional tasks.   Time 6   Period Weeks   Status On-going   PT LONG TERM GOAL #5   Title Perform ADL's with pain not > 2-3/10.   Time 6   Period Weeks   Status Achieved   PT LONG TERM GOAL #6   Title Walk without antalgia or deviation.   Time 6   Period Weeks   Status On-going               Plan - 04/23/15 0807    Clinical Impression Statement Patient continues to tolerate treatment well without complaint of increased pain verbalized. AROM of L knee measured as 0- 110 degrees today in clinic. Firm end feel noted during PROM into flexion. Normal modalities response noted following removal of the modalities. Accepted new HEP to increase knee flexion without questions.    Pt will benefit from skilled therapeutic intervention in order to improve on the following deficits Pain;Decreased activity tolerance;Decreased strength;Decreased mobility;Abnormal gait;Decreased range of motion   Rehab  Potential Excellent   PT Frequency 3x / week   PT Duration 4 weeks   PT Treatment/Interventions ADLs/Self Care Home Management;Cryotherapy;Electrical Stimulation;Gait training;Stair  training;Therapeutic activities;Therapeutic exercise;Neuromuscular re-education;Patient/family education;Manual techniques;Vasopneumatic Device;Passive range of motion;Scar mobilization   PT Next Visit Plan Continue per MPT POC and TKR protocol.    Consulted and Agree with Plan of Care Patient        Problem List Patient Active Problem List   Diagnosis Date Noted  . Obese 03/10/2015  . S/P left TKA 03/09/2015  . S/P right TKA 10/05/2014  . Arthritis of shoulder region, right, degenerative 10/03/2013    Wynelle Fanny, PTA 04/23/2015, 8:29 AM  [facility] [address] [place], [place], [address] Phone: [phone]   Fax:  [phone]

## 2015-04-26 ENCOUNTER — Ambulatory Visit: Payer: Medicare Other | Admitting: Physical Therapy

## 2015-04-26 ENCOUNTER — Encounter: Payer: Self-pay | Admitting: Physical Therapy

## 2015-04-26 DIAGNOSIS — M25562 Pain in left knee: Secondary | ICD-10-CM | POA: Diagnosis not present

## 2015-04-26 DIAGNOSIS — M25662 Stiffness of left knee, not elsewhere classified: Secondary | ICD-10-CM

## 2015-04-26 NOTE — Therapy (Signed)
[facility] [place], [place], [address] Phone: [phone]   Fax:  [phone]  Physical Therapy Treatment  Patient Details  Name: Alexis Hopkins MRN: [id number] Date of Birth: 09-26-1946 Referring Provider:  Mayra Neer, MD  Encounter Date: 04/26/2015      PT End of Session - 04/26/15 0735    Visit Number 7   Number of Visits 12   Date for PT Re-Evaluation 05/18/15   PT Start Time 0733   PT Stop Time 0822   PT Time Calculation (min) 49 min   Activity Tolerance Patient tolerated treatment well   Behavior During Therapy [facility] for tasks assessed/performed      Past Medical History  Diagnosis Date  . Hypertension   . Diabetes mellitus without complication   . TIA (transient ischemic attack) 1994    Takes Plavix  . History of kidney stones 40 YRS AGO  . Hyperlipemia   . Arthritis   . Family history of adverse reaction to anesthesia     "mother always was deathly sick"  . Osteoporosis   . Hx of skin cancer, basal cell   . RSD (reflex sympathetic dystrophy)     L ARM  . Cancer     SKIN CANCERS REMOVED  . Anemia     Past Surgical History  Procedure Laterality Date  . Rotator cuff repair Right   . Shoulder arthroscopy Right   . Knee arthroscopy      X 1 on right; X 2 on left  . Abdominal hysterectomy  1995  . Reverse shoulder arthroplasty Right 10/03/2013    Procedure: RIGHT REVERSE SHOULDER ARTHROPLASTY;  Surgeon: Augustin Schooling, MD;  Location: [place];  Service: Orthopedics;  Laterality: Right;  . Mohs surgery    . Total knee arthroplasty Right 10/05/2014    Procedure: RIGHT TOTAL KNEE ARTHROPLASTY;  Surgeon: Mauri Pole, MD;  Location: [facility];  Service: Orthopedics;  Laterality: Right;  . Total knee arthroplasty Left 03/09/2015    Procedure: LEFT TOTAL KNEE ARTHROPLASTY;  Surgeon: Paralee Cancel, MD;  Location: [facility];  Service: Orthopedics;  Laterality: Left;    There were no vitals filed for this  visit.  Visit Diagnosis:  Left knee pain  Knee stiffness, left      Subjective Assessment - 04/26/15 0734    Subjective Reports that she was up on her feet a lot over the weekend.   Limitations Walking   How long can you walk comfortably? 15-20 minutes.   Patient Stated Goals Return function to left knee without pain.   Currently in Pain? Yes   Pain Score 3    Pain Location Knee   Pain Orientation Left   Pain Descriptors / Indicators Tightness   Pain Type Surgical pain   Pain Onset 1 to 4 weeks ago            [facility] PT Assessment - 04/26/15 0001    Assessment   Medical Diagnosis Left total knee replacment.   Onset Date/Surgical Date 03/09/15   Next MD Visit 04/28/2015   AROM   Overall AROM  Deficits   AROM Assessment Site Knee   Right/Left Knee Left   Left Knee Extension 0   Left Knee Flexion 110   Palpation   Patella mobility Good L patellar mobility in all directions                     [facility] Adult PT Treatment/Exercise - 04/26/15  0001    Knee/Hip Exercises: Aerobic   Stationary Bike L1 x10 minutes   Knee/Hip Exercises: Machines for Strengthening   Cybex Knee Extension 10# 3 sets 10 reps   Cybex Knee Flexion 20# 1 set 10 reps, 30# 2 sets 10 reps   Knee/Hip Exercises: Standing   Forward Lunges Left;2 sets;10 reps;2 seconds  on 14" step   Rocker Board 3 minutes   Modalities   Modalities Electrical Stimulation;Vasopneumatic   Electrical Stimulation   Electrical Stimulation Location L knee    Electrical Stimulation Action IFC   Electrical Stimulation Parameters 1-10 Hz x15 min   Electrical Stimulation Goals Pain   Vasopneumatic   Number Minutes Vasopneumatic  15 minutes   Vasopnuematic Location  Knee   Vasopneumatic Pressure Low   Vasopneumatic Temperature  53   Manual Therapy   Manual Therapy Passive ROM;Soft tissue mobilization   Soft tissue mobilization L patellar mobilizations into sup/inf, L/R, tilts; L knee inicision scar mobilizations    Passive ROM L knee into flex/ext with gentle holds at end range                  PT Short Term Goals - 04/13/15 8916    PT SHORT TERM GOAL #1   Title Ind with an initial HEP.   Time 2   Period Weeks   Status Achieved           PT Long Term Goals - 04/15/15 0805    PT LONG TERM GOAL #1   Title Ind with an advanced HEP.   Time 6   Period Weeks   Status On-going   PT LONG TERM GOAL #2   Title Full active left knee extension to normalize gait.   Time 6   Period Weeks   Status Achieved  AROM L knee 0 deg ext as of 04/15/2015   PT LONG TERM GOAL #3   Title Active left knee flexion to 120 degrees.   Time 6   Period Weeks   Status On-going   PT LONG TERM GOAL #4   Title Left knee strengh to 5/5 to increase stability for functional tasks.   Time 6   Period Weeks   Status On-going   PT LONG TERM GOAL #5   Title Perform ADL's with pain not > 2-3/10.   Time 6   Period Weeks   Status Achieved   PT LONG TERM GOAL #6   Title Walk without antalgia or deviation.   Time 6   Period Weeks   Status On-going               Plan - 04/26/15 0810    Clinical Impression Statement Patient continues to tolerate treatment well and new strengthening exercises well without complaint of pain. AROM of L knee measured as 0- 110 deg today in clinic. Continues to ambulate with minimal antalgia without an assistive device. Demonstrates good L patellar mobility in all directions today. Normal modalties response noted following removal of the modalities. Denied pain following treatment.   Pt will benefit from skilled therapeutic intervention in order to improve on the following deficits Pain;Decreased activity tolerance;Decreased strength;Decreased mobility;Abnormal gait;Decreased range of motion   Rehab Potential Excellent   PT Frequency 3x / week   PT Duration 4 weeks   PT Treatment/Interventions ADLs/Self Care Home Management;Cryotherapy;Psychologist, clinical;Therapeutic activities;Therapeutic exercise;Neuromuscular re-education;Patient/family education;Manual techniques;Vasopneumatic Device;Passive range of motion;Scar mobilization   PT Next Visit Plan Continue per MPT POC and TKR protocol.  Sees Dr. Alvan Dame 04/28/2015   Consulted and Agree with Plan of Care Patient        Problem List Patient Active Problem List   Diagnosis Date Noted  . Obese 03/10/2015  . S/P left TKA 03/09/2015  . S/P right TKA 10/05/2014  . Arthritis of shoulder region, right, degenerative 10/03/2013    Ahmed Prima, PTA 04/26/2015 8:26 AM Mali Applegate MPT [facility] [place], [place], [address] Phone: [phone]   Fax:  [phone]

## 2015-04-28 NOTE — Therapy (Addendum)
[facility] [place], [place], [address] Phone: [phone]   Fax:  [phone]  Physical Therapy Treatment  Patient Details  Name: Alexis Hopkins MRN: [id number] Date of Birth: 10/18/1945 Referring Provider:  Mayra Neer, MD  Encounter Date: 04/26/2015    Past Medical History  Diagnosis Date  . Hypertension   . Diabetes mellitus without complication   . TIA (transient ischemic attack) 1994    Takes Plavix  . History of kidney stones 40 YRS AGO  . Hyperlipemia   . Arthritis   . Family history of adverse reaction to anesthesia     "mother always was deathly sick"  . Osteoporosis   . Hx of skin cancer, basal cell   . RSD (reflex sympathetic dystrophy)     L ARM  . Cancer     SKIN CANCERS REMOVED  . Anemia     Past Surgical History  Procedure Laterality Date  . Rotator cuff repair Right   . Shoulder arthroscopy Right   . Knee arthroscopy      X 1 on right; X 2 on left  . Abdominal hysterectomy  1995  . Reverse shoulder arthroplasty Right 10/03/2013    Procedure: RIGHT REVERSE SHOULDER ARTHROPLASTY;  Surgeon: Augustin Schooling, MD;  Location: [place];  Service: Orthopedics;  Laterality: Right;  . Mohs surgery    . Total knee arthroplasty Right 10/05/2014    Procedure: RIGHT TOTAL KNEE ARTHROPLASTY;  Surgeon: Mauri Pole, MD;  Location: [facility];  Service: Orthopedics;  Laterality: Right;  . Total knee arthroplasty Left 03/09/2015    Procedure: LEFT TOTAL KNEE ARTHROPLASTY;  Surgeon: Paralee Cancel, MD;  Location: [facility];  Service: Orthopedics;  Laterality: Left;    There were no vitals filed for this visit.  Visit Diagnosis:  Left knee pain  Knee stiffness, left                                 PT Short Term Goals - 04/13/15 1517    PT SHORT TERM GOAL #1   Title Ind with an initial HEP.   Time 2   Period Weeks   Status Achieved           PT Long Term Goals - 04/15/15 0805     PT LONG TERM GOAL #1   Title Ind with an advanced HEP.   Time 6   Period Weeks   Status On-going   PT LONG TERM GOAL #2   Title Full active left knee extension to normalize gait.   Time 6   Period Weeks   Status Achieved  AROM L knee 0 deg ext as of 04/15/2015   PT LONG TERM GOAL #3   Title Active left knee flexion to 120 degrees.   Time 6   Period Weeks   Status On-going   PT LONG TERM GOAL #4   Title Left knee strengh to 5/5 to increase stability for functional tasks.   Time 6   Period Weeks   Status On-going   PT LONG TERM GOAL #5   Title Perform ADL's with pain not > 2-3/10.   Time 6   Period Weeks   Status Achieved   PT LONG TERM GOAL #6   Title Walk without antalgia or deviation.   Time 6   Period Weeks   Status On-going  Problem List Patient Active Problem List   Diagnosis Date Noted  . Obese 03/10/2015  . S/P left TKA 03/09/2015  . S/P right TKA 10/05/2014  . Arthritis of shoulder region, right, degenerative 10/03/2013   PHYSICAL THERAPY DISCHARGE SUMMARY  Visits from Start of Care: 7 Current functional level related to goals / functional outcomes: Please see above.   Remaining deficits: Decreased flexion and strength.   Education / Equipment: HEP.  Plan: Patient agrees to discharge.  Patient goals were partially met. Patient is being discharged due to the physician's request.  ?????      Makenzy Krist, [place] MPT 04/28/2015, 3:14 PM  [facility] [address] [place], [place], [address] Phone: [phone]   Fax:  [phone]

## 2015-04-29 ENCOUNTER — Encounter: Payer: Medicare Other | Admitting: Physical Therapy

## 2015-09-23 ENCOUNTER — Ambulatory Visit
Admission: RE | Admit: 2015-09-23 | Discharge: 2015-09-23 | Disposition: A | Discharge status: Home / Self Care | Payer: Medicare Other | Source: Ambulatory Visit | Attending: Family Medicine | Admitting: Family Medicine

## 2015-09-23 ENCOUNTER — Other Ambulatory Visit: Payer: Self-pay | Admitting: Family Medicine

## 2015-09-23 DIAGNOSIS — R109 Unspecified abdominal pain: Secondary | ICD-10-CM

## 2016-03-10 ENCOUNTER — Other Ambulatory Visit: Payer: Self-pay | Admitting: Family Medicine

## 2016-03-10 DIAGNOSIS — Z1231 Encounter for screening mammogram for malignant neoplasm of breast: Secondary | ICD-10-CM

## 2016-03-15 ENCOUNTER — Ambulatory Visit: Payer: Medicare Other

## 2016-03-16 ENCOUNTER — Ambulatory Visit
Admission: RE | Admit: 2016-03-16 | Discharge: 2016-03-16 | Disposition: A | Discharge status: Home / Self Care | Payer: Medicare Other | Source: Ambulatory Visit | Attending: Family Medicine | Admitting: Family Medicine

## 2016-03-16 DIAGNOSIS — Z1231 Encounter for screening mammogram for malignant neoplasm of breast: Secondary | ICD-10-CM

## 2016-03-20 ENCOUNTER — Ambulatory Visit: Payer: Medicare Other

## 2016-06-12 IMAGING — US US ABDOMEN COMPLETE
1 series · 13 of 25 positions shown · non-contrast
Comparison: None.

CLINICAL DATA: Chronic abdominal pain, primarily periumbilical

EXAM:
ABDOMEN ULTRASOUND COMPLETE

[Series 1: us abdomen complete · 0.28mm/px · 13 of 79 slices shown]
[im 1/79]
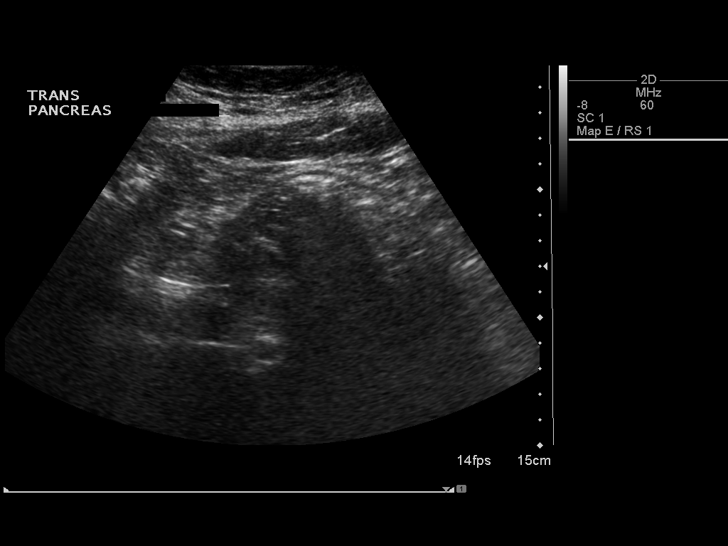
[im 7/79]
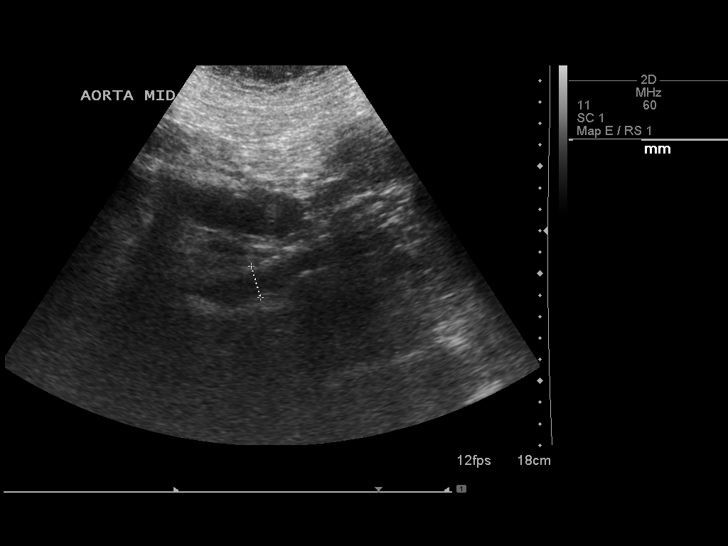
[im 14/79]
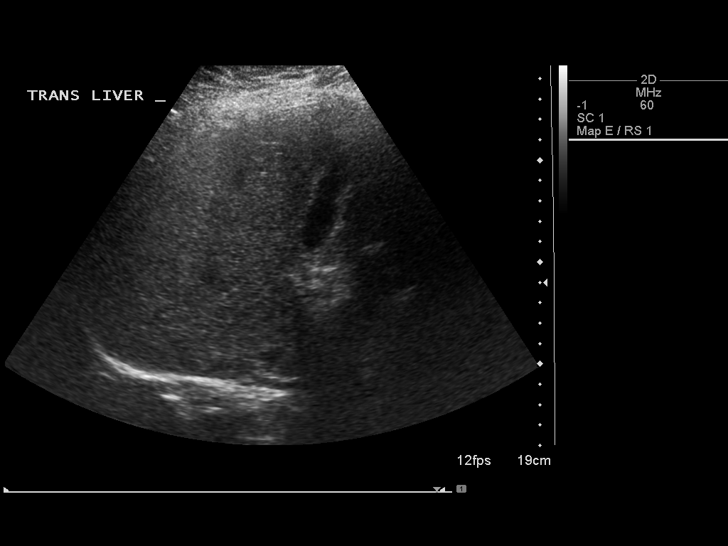
[im 20/79]
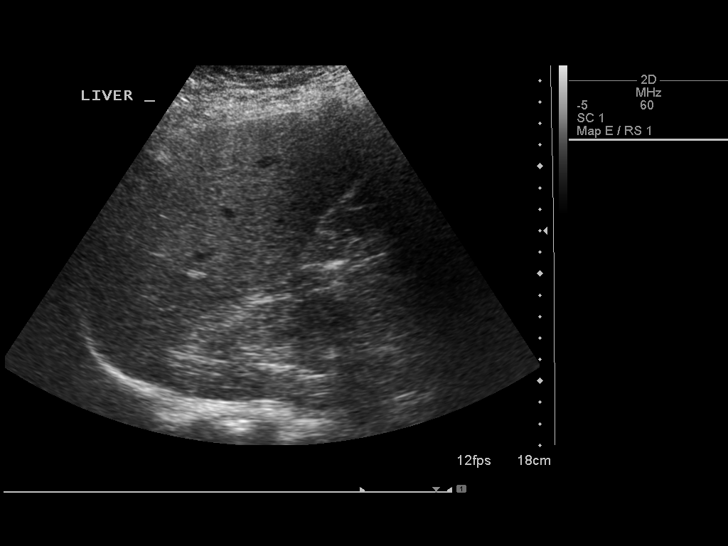
[im 27/79]
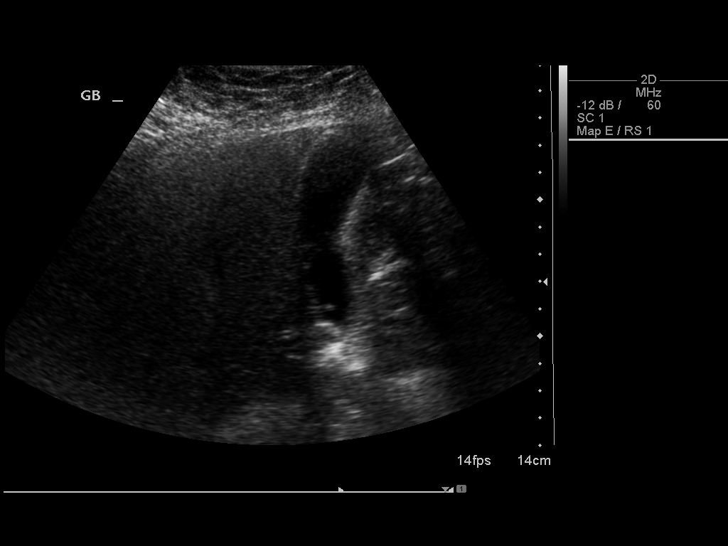
[im 33/79]
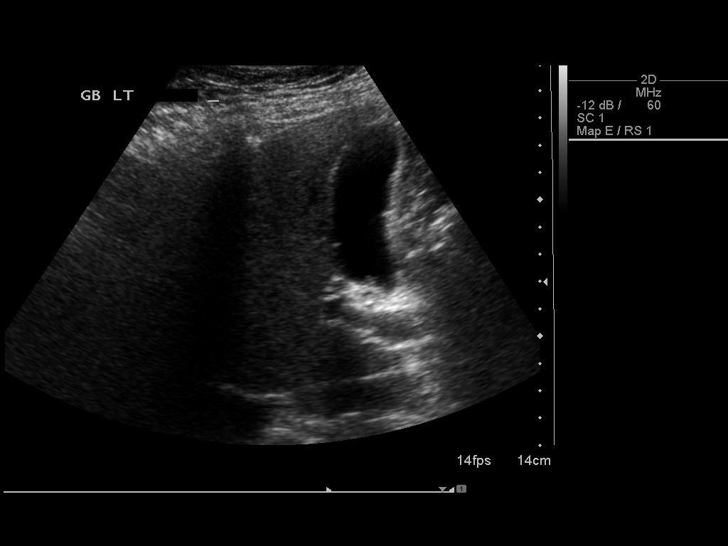
[im 40/79]
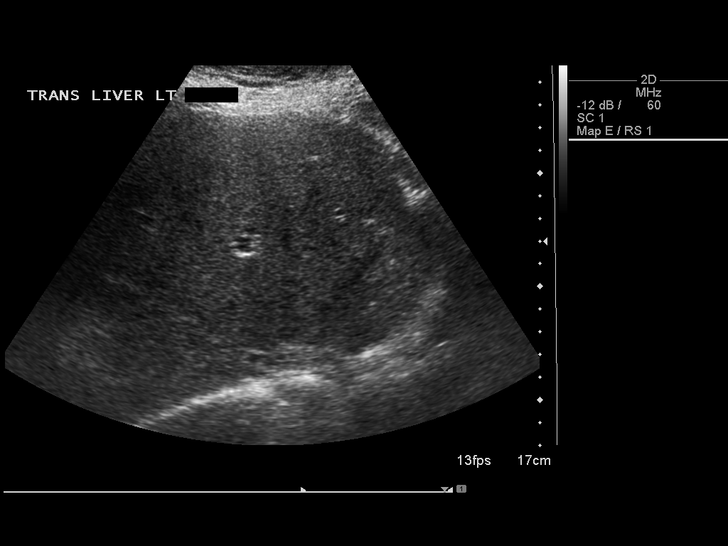
[im 46/79]
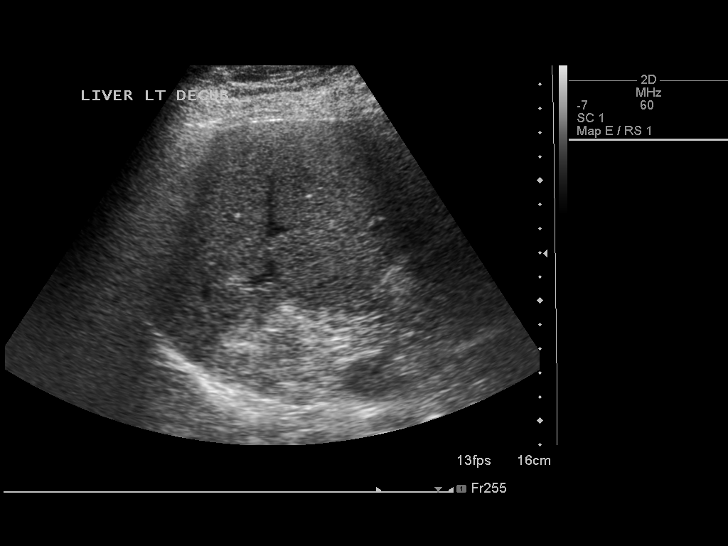
[im 53/79]
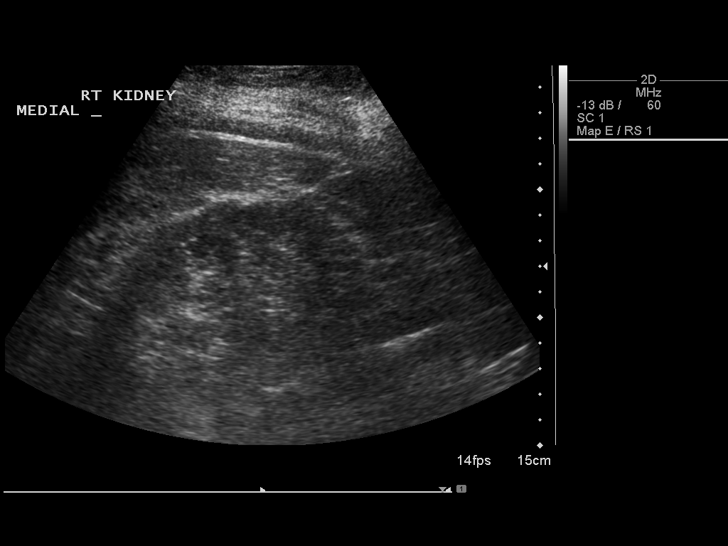
[im 59/79]
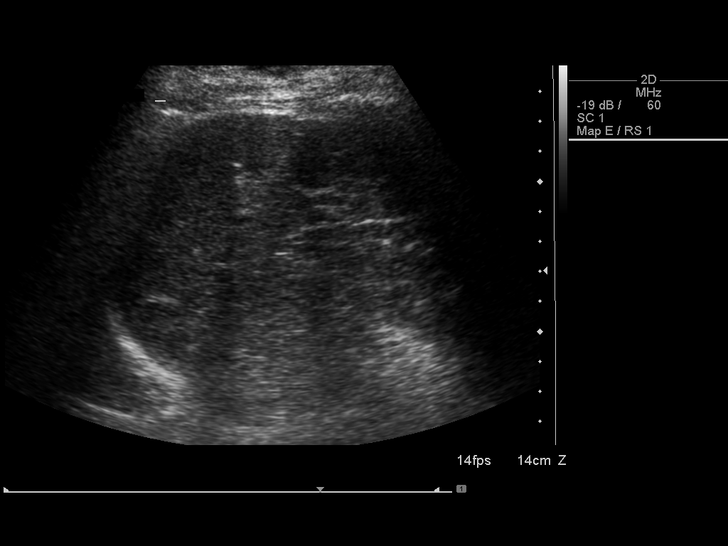
[im 66/79]
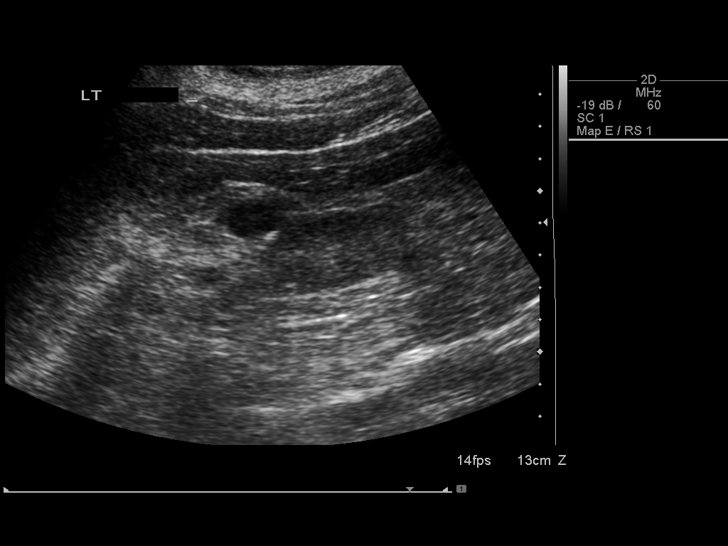
[im 72/79]
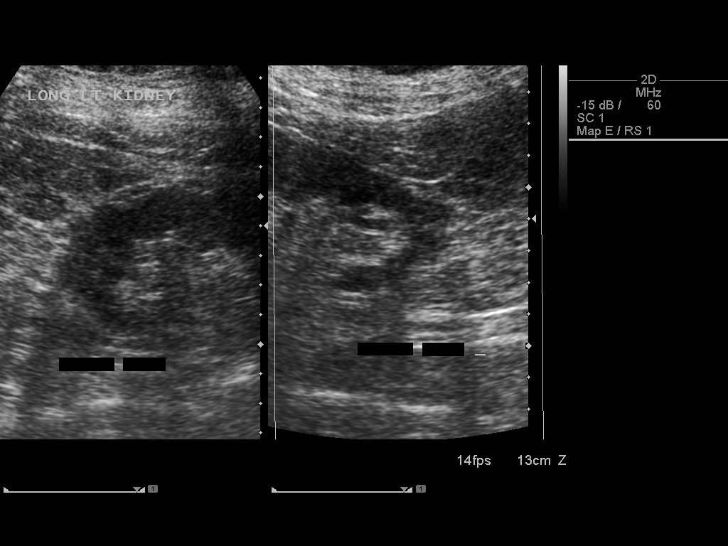
[im 79/79]
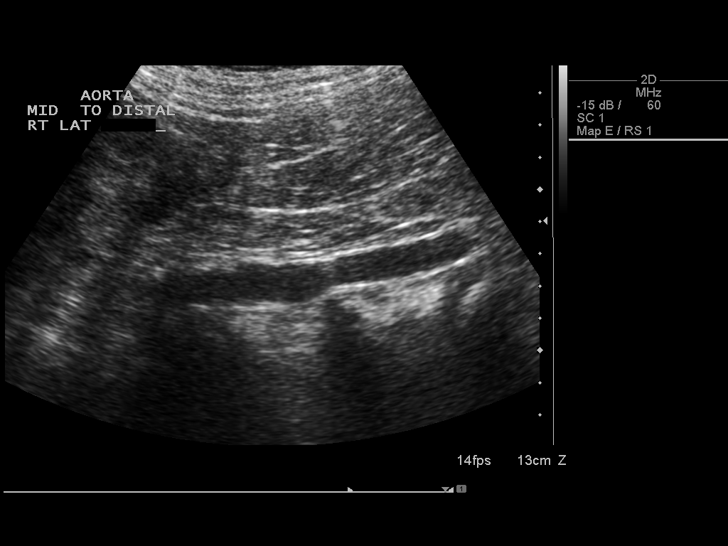

[13 of 25 positions shown; findings below may reference images not displayed]

FINDINGS: Gallbladder: Within the gallbladder, there is an echogenic focus
near the neck which neither moves nor shadows measuring 4 mm in
length. A similar appearing nonmobile echogenic focus along the wall
of the gallbladder toward the neck measures 3 mm. A third nearby
echogenic focus which neither moves nor shadows measures 3 mm. No
larger lesions identified. There are no echogenic foci which move
and shadow as would be expected with gallstones. There is no
gallbladder wall thickening or pericholecystic fluid. No sonographic
Murphy sign noted by sonographer.

Common bile duct: Diameter: 6 mm. There is no intrahepatic, common
hepatic, or common bile duct dilatation.

Liver: No focal lesion identified. Within normal limits in
parenchymal echogenicity.

IVC: No abnormality visualized. Portions of inferior vena cava
obscured by gas.

Pancreas: Visualized portion unremarkable. Portions of pancreas
obscured by gas.

Spleen: Size and appearance within normal limits.

Right Kidney: Length: 11.2 cm. Echogenicity within normal limits. No
mass or hydronephrosis visualized.

Left Kidney: Length: 10.8 cm. Echogenicity within normal limits. No
hydronephrosis visualized. There is a cyst arising from the lower
pole left kidney measuring 1.6 x 1.2 x 1.7 cm.

Abdominal aorta: No aneurysm visualized.

Other findings: No demonstrable ascites.
IMPRESSION: There are three probable small polyps in the gallbladder, measuring
between 3 and 4 mm in size. There are no echogenic foci in the
gallbladder which move and shadow as would be expected with
gallstones. Gallbladder otherwise appears normal. It may be prudent
to consider a followup ultrasound in approximately 1 year to assess
for stability of these presumed small polyps.

Portions of the inferior vena cava and pancreas are obscured by gas.
Visualized portions of the structures appear normal.

Small left renal cyst.

Study otherwise unremarkable.

## 2016-09-01 ENCOUNTER — Other Ambulatory Visit: Payer: Self-pay | Admitting: Family Medicine

## 2016-09-01 DIAGNOSIS — K824 Cholesterolosis of gallbladder: Secondary | ICD-10-CM

## 2016-09-08 ENCOUNTER — Ambulatory Visit
Admission: RE | Admit: 2016-09-08 | Discharge: 2016-09-08 | Disposition: A | Discharge status: Home / Self Care | Payer: Medicare Other | Source: Ambulatory Visit | Attending: Family Medicine | Admitting: Family Medicine

## 2016-09-08 DIAGNOSIS — K824 Cholesterolosis of gallbladder: Secondary | ICD-10-CM

## 2017-03-07 ENCOUNTER — Other Ambulatory Visit: Payer: Self-pay | Admitting: Family Medicine

## 2017-03-07 DIAGNOSIS — Z1231 Encounter for screening mammogram for malignant neoplasm of breast: Secondary | ICD-10-CM

## 2017-03-21 ENCOUNTER — Ambulatory Visit
Admission: RE | Admit: 2017-03-21 | Discharge: 2017-03-21 | Disposition: A | Discharge status: Home / Self Care | Payer: Medicare Other | Source: Ambulatory Visit | Attending: Family Medicine | Admitting: Family Medicine

## 2017-03-21 DIAGNOSIS — Z1231 Encounter for screening mammogram for malignant neoplasm of breast: Secondary | ICD-10-CM

## 2018-04-05 ENCOUNTER — Other Ambulatory Visit: Payer: Self-pay | Admitting: Family Medicine

## 2018-04-05 ENCOUNTER — Ambulatory Visit
Admission: RE | Admit: 2018-04-05 | Discharge: 2018-04-05 | Disposition: A | Discharge status: Home / Self Care | Payer: Medicare Other | Source: Ambulatory Visit | Attending: Family Medicine | Admitting: Family Medicine

## 2018-04-05 DIAGNOSIS — Z1231 Encounter for screening mammogram for malignant neoplasm of breast: Secondary | ICD-10-CM

## 2019-04-10 ENCOUNTER — Other Ambulatory Visit: Payer: Self-pay | Admitting: Family Medicine

## 2019-04-10 DIAGNOSIS — Z1231 Encounter for screening mammogram for malignant neoplasm of breast: Secondary | ICD-10-CM

## 2019-04-30 ENCOUNTER — Other Ambulatory Visit: Payer: Self-pay

## 2019-04-30 ENCOUNTER — Ambulatory Visit
Admission: RE | Admit: 2019-04-30 | Discharge: 2019-04-30 | Disposition: A | Discharge status: Home / Self Care | Payer: Medicare Other | Source: Ambulatory Visit | Attending: Family Medicine | Admitting: Family Medicine

## 2019-04-30 DIAGNOSIS — Z1231 Encounter for screening mammogram for malignant neoplasm of breast: Secondary | ICD-10-CM

## 2019-07-15 ENCOUNTER — Other Ambulatory Visit: Payer: Self-pay | Admitting: Family Medicine

## 2019-07-15 DIAGNOSIS — R42 Dizziness and giddiness: Secondary | ICD-10-CM

## 2019-07-21 ENCOUNTER — Ambulatory Visit
Admission: RE | Admit: 2019-07-21 | Discharge: 2019-07-21 | Disposition: A | Discharge status: Home / Self Care | Payer: Medicare Other | Source: Ambulatory Visit | Attending: Family Medicine | Admitting: Family Medicine

## 2019-07-21 DIAGNOSIS — R42 Dizziness and giddiness: Secondary | ICD-10-CM

## 2019-10-19 ENCOUNTER — Ambulatory Visit: Payer: Medicare Other | Attending: Internal Medicine

## 2019-10-19 DIAGNOSIS — Z23 Encounter for immunization: Secondary | ICD-10-CM | POA: Insufficient documentation

## 2019-10-19 NOTE — Progress Notes (Signed)
   Covid-19 Vaccination Clinic  Name:  Alexis Hopkins    MRN: [id number] DOB: December 25, 1945  10/19/2019  [name] was observed post Covid-19 immunization for 46 MINS without incidence. She was provided with Vaccine Information Sheet and instruction to access the V-Safe system.   [name] was instructed to call 911 with any severe reactions post vaccine: Marland Kitchen Difficulty breathing  . Swelling of your face and throat  . A fast heartbeat  . A bad rash all over your body  . Dizziness and weakness

## 2019-11-09 ENCOUNTER — Ambulatory Visit: Payer: Medicare Other | Attending: Internal Medicine

## 2019-11-09 DIAGNOSIS — Z23 Encounter for immunization: Secondary | ICD-10-CM | POA: Insufficient documentation

## 2019-11-09 NOTE — Progress Notes (Signed)
   Covid-19 Vaccination Clinic  Name:  Alexis Hopkins    MRN: [id number] DOB: [birth year]  11/09/2019  Ms. Cancel was observed post Covid-19 immunization for 15 minutes without incidence. She was provided with Vaccine Information Sheet and instruction to access the V-Safe system.   [name] was instructed to call 911 with any severe reactions post vaccine: Marland Kitchen Difficulty breathing  . Swelling of your face and throat  . A fast heartbeat  . A bad rash all over your body  . Dizziness and weakness    Immunizations Administered    Name Date Dose VIS Date Route   Pfizer COVID-19 Vaccine 11/09/2019  9:38 AM 0.3 mL 09/12/2019 Intramuscular   Manufacturer: [place]   Lot: [id number]   [place]: [id number]

## 2019-11-18 ENCOUNTER — Ambulatory Visit: Payer: Medicare Other

## 2020-01-13 ENCOUNTER — Other Ambulatory Visit: Payer: Self-pay | Admitting: Family Medicine

## 2020-01-13 DIAGNOSIS — R1902 Left upper quadrant abdominal swelling, mass and lump: Secondary | ICD-10-CM

## 2020-01-16 ENCOUNTER — Ambulatory Visit
Admission: RE | Admit: 2020-01-16 | Discharge: 2020-01-16 | Disposition: A | Discharge status: Home / Self Care | Payer: Medicare Other | Source: Ambulatory Visit | Attending: Family Medicine | Admitting: Family Medicine

## 2020-01-16 DIAGNOSIS — R1902 Left upper quadrant abdominal swelling, mass and lump: Secondary | ICD-10-CM

## 2020-05-26 ENCOUNTER — Other Ambulatory Visit: Payer: Self-pay

## 2020-05-26 ENCOUNTER — Other Ambulatory Visit: Payer: Self-pay | Admitting: Family Medicine

## 2020-05-26 ENCOUNTER — Ambulatory Visit
Admission: RE | Admit: 2020-05-26 | Discharge: 2020-05-26 | Disposition: A | Discharge status: Home / Self Care | Payer: Medicare Other | Source: Ambulatory Visit | Attending: Family Medicine | Admitting: Family Medicine

## 2020-05-26 DIAGNOSIS — Z1231 Encounter for screening mammogram for malignant neoplasm of breast: Secondary | ICD-10-CM

## 2020-06-08 ENCOUNTER — Encounter: Payer: Self-pay | Admitting: Dermatology

## 2020-06-08 ENCOUNTER — Ambulatory Visit: Payer: Medicare Other | Admitting: Dermatology

## 2020-06-08 ENCOUNTER — Other Ambulatory Visit: Payer: Self-pay

## 2020-06-08 DIAGNOSIS — Z1283 Encounter for screening for malignant neoplasm of skin: Secondary | ICD-10-CM

## 2020-06-08 DIAGNOSIS — L570 Actinic keratosis: Secondary | ICD-10-CM | POA: Diagnosis not present

## 2020-06-08 DIAGNOSIS — Z85828 Personal history of other malignant neoplasm of skin: Secondary | ICD-10-CM | POA: Diagnosis not present

## 2020-06-08 DIAGNOSIS — Z8589 Personal history of malignant neoplasm of other organs and systems: Secondary | ICD-10-CM

## 2020-06-08 NOTE — Progress Notes (Signed)
   Follow-Up Visit   Subjective  Alexis Hopkins is a 74 y.o. female who presents for the following: Annual Exam (LEFT UPPER ARM CRUST, LEFT NOSE NEAR OLD SPOT CRUST, LEFT FOREARM DRY CRUST).  Skin examination Location:  Duration:  Quality:  Associated Signs/Symptoms: Modifying Factors:  Severity:  Timing: Context:   Objective  Well appearing patient in no apparent distress; mood and affect are within normal limits.  All sun exposed areas plus back examined. Plus legs.   Assessment & Plan    AK (actinic keratosis) (2) Left Upper Arm - Anterior; Left Forearm - Anterior  Patient will schedule PDT blue light in 1 year.   Destruction of lesion - Left Forearm - Anterior, Left Upper Arm - Anterior Complexity: simple   Destruction method: cryotherapy   Informed consent: discussed and consent obtained   Timeout:  patient name, date of birth, surgical site, and procedure verified Lesion destroyed using liquid nitrogen: Yes   Cryotherapy cycles:  5 Outcome: patient tolerated procedure well with no complications   Post-procedure details: wound care instructions given    Screening for malignant neoplasm of skin Mid Back  Annual dermatological examination, self examined twice annually  History of basal cell carcinoma (BCC) of skin Left Supratip of Nose  No intervention currently indicated.  History of squamous cell carcinoma Left Forehead  No intervention currently indicated     I, Lavonna Monarch, MD, have reviewed all documentation for this visit.  The documentation on 07/19/20 for the exam, diagnosis, procedures, and orders are all accurate and complete.

## 2020-06-08 NOTE — Progress Notes (Signed)
LN2 PER DR TAFEEN  LEFT UPPER ARM X1 LEFT FOREARM X1

## 2020-06-10 ENCOUNTER — Ambulatory Visit: Payer: Medicare Other | Attending: Internal Medicine

## 2020-06-10 DIAGNOSIS — Z23 Encounter for immunization: Secondary | ICD-10-CM

## 2020-06-10 NOTE — Progress Notes (Signed)
   Covid-19 Vaccination Clinic  Name:  Alexis Hopkins    MRN: [id number] DOB: 17-Apr-1946  06/10/2020  Alexis Hopkins was observed post Covid-19 immunization for 15 minutes without incident. She was provided with Vaccine Information Sheet and instruction to access the V-Safe system.   Alexis Hopkins was instructed to call 911 with any severe reactions post vaccine: Marland Kitchen Difficulty breathing  . Swelling of face and throat  . A fast heartbeat  . A bad rash all over body  . Dizziness and weakness

## 2020-07-03 ENCOUNTER — Encounter: Payer: Self-pay | Admitting: Dermatology

## 2020-08-17 ENCOUNTER — Ambulatory Visit: Payer: Medicare Other

## 2020-09-01 ENCOUNTER — Other Ambulatory Visit: Payer: Self-pay | Admitting: Family Medicine

## 2020-09-01 DIAGNOSIS — M8580 Other specified disorders of bone density and structure, unspecified site: Secondary | ICD-10-CM

## 2020-09-08 ENCOUNTER — Other Ambulatory Visit: Payer: Medicare Other

## 2020-09-08 DIAGNOSIS — Z20822 Contact with and (suspected) exposure to covid-19: Secondary | ICD-10-CM

## 2020-09-09 LAB — SARS-COV-2, NAA 2 DAY TAT

## 2020-09-09 LAB — NOVEL CORONAVIRUS, NAA: SARS-CoV-2, NAA: NOT DETECTED

## 2020-09-10 ENCOUNTER — Telehealth: Payer: Self-pay

## 2020-09-10 NOTE — Telephone Encounter (Signed)
Pt is aware covid 19 test is neg on 09/10/2020

## 2020-10-11 ENCOUNTER — Other Ambulatory Visit: Payer: Medicare Other

## 2020-10-11 DIAGNOSIS — Z20822 Contact with and (suspected) exposure to covid-19: Secondary | ICD-10-CM | POA: Diagnosis not present

## 2020-10-12 LAB — NOVEL CORONAVIRUS, NAA: SARS-CoV-2, NAA: DETECTED — AB

## 2020-10-12 LAB — SARS-COV-2, NAA 2 DAY TAT

## 2020-10-13 ENCOUNTER — Other Ambulatory Visit: Payer: Medicare Other

## 2020-10-13 ENCOUNTER — Telehealth: Payer: Self-pay | Admitting: Adult Health

## 2020-10-13 NOTE — Telephone Encounter (Signed)
Called to discuss with patient about COVID-19 symptoms and the use of one of the available treatments for those with mild to moderate Covid symptoms and at a high risk of hospitalization.  Pt appears to qualify for outpatient treatment due to co-morbid conditions and/or a member of an at-risk group in accordance with the FDA Emergency Use Authorization.    Symptom onset: 1.5.22 Vaccinated: yes x 2   Booster? yes Immunocompromised? No  Qualifiers: age , DM   Does not qualify Sx >7 d   Kaleea Penner NP-C

## 2020-12-06 ENCOUNTER — Other Ambulatory Visit: Payer: Self-pay | Admitting: Family Medicine

## 2020-12-06 DIAGNOSIS — M8580 Other specified disorders of bone density and structure, unspecified site: Secondary | ICD-10-CM

## 2020-12-07 ENCOUNTER — Other Ambulatory Visit: Payer: Medicare Other

## 2020-12-15 DIAGNOSIS — R8290 Unspecified abnormal findings in urine: Secondary | ICD-10-CM | POA: Diagnosis not present

## 2020-12-15 DIAGNOSIS — M545 Low back pain, unspecified: Secondary | ICD-10-CM | POA: Diagnosis not present

## 2020-12-15 DIAGNOSIS — E1169 Type 2 diabetes mellitus with other specified complication: Secondary | ICD-10-CM | POA: Diagnosis not present

## 2021-01-10 ENCOUNTER — Ambulatory Visit
Admission: RE | Admit: 2021-01-10 | Discharge: 2021-01-10 | Disposition: A | Discharge status: Home / Self Care | Payer: Medicare Other | Source: Ambulatory Visit | Attending: Family Medicine | Admitting: Family Medicine

## 2021-01-10 ENCOUNTER — Other Ambulatory Visit: Payer: Self-pay

## 2021-01-10 DIAGNOSIS — M8589 Other specified disorders of bone density and structure, multiple sites: Secondary | ICD-10-CM | POA: Diagnosis not present

## 2021-01-10 DIAGNOSIS — Z780 Asymptomatic menopausal state: Secondary | ICD-10-CM | POA: Diagnosis not present

## 2021-01-10 DIAGNOSIS — M47816 Spondylosis without myelopathy or radiculopathy, lumbar region: Secondary | ICD-10-CM | POA: Diagnosis not present

## 2021-01-10 DIAGNOSIS — M8580 Other specified disorders of bone density and structure, unspecified site: Secondary | ICD-10-CM

## 2021-02-07 DIAGNOSIS — H16223 Keratoconjunctivitis sicca, not specified as Sjogren's, bilateral: Secondary | ICD-10-CM | POA: Diagnosis not present

## 2021-02-07 DIAGNOSIS — H0102A Squamous blepharitis right eye, upper and lower eyelids: Secondary | ICD-10-CM | POA: Diagnosis not present

## 2021-02-07 DIAGNOSIS — H0102B Squamous blepharitis left eye, upper and lower eyelids: Secondary | ICD-10-CM | POA: Diagnosis not present

## 2021-02-07 DIAGNOSIS — E119 Type 2 diabetes mellitus without complications: Secondary | ICD-10-CM | POA: Diagnosis not present

## 2021-02-08 DIAGNOSIS — E782 Mixed hyperlipidemia: Secondary | ICD-10-CM | POA: Diagnosis not present

## 2021-02-08 DIAGNOSIS — I129 Hypertensive chronic kidney disease with stage 1 through stage 4 chronic kidney disease, or unspecified chronic kidney disease: Secondary | ICD-10-CM | POA: Diagnosis not present

## 2021-02-08 DIAGNOSIS — E1169 Type 2 diabetes mellitus with other specified complication: Secondary | ICD-10-CM | POA: Diagnosis not present

## 2021-05-13 ENCOUNTER — Other Ambulatory Visit: Payer: Self-pay | Admitting: Family Medicine

## 2021-05-13 DIAGNOSIS — Z1231 Encounter for screening mammogram for malignant neoplasm of breast: Secondary | ICD-10-CM

## 2021-05-23 ENCOUNTER — Other Ambulatory Visit: Payer: Self-pay

## 2021-05-23 ENCOUNTER — Ambulatory Visit: Payer: Medicare Other | Admitting: Dermatology

## 2021-05-23 ENCOUNTER — Encounter: Payer: Self-pay | Admitting: Dermatology

## 2021-05-23 DIAGNOSIS — Z85828 Personal history of other malignant neoplasm of skin: Secondary | ICD-10-CM

## 2021-05-23 DIAGNOSIS — Z1283 Encounter for screening for malignant neoplasm of skin: Secondary | ICD-10-CM

## 2021-05-23 DIAGNOSIS — L821 Other seborrheic keratosis: Secondary | ICD-10-CM

## 2021-05-23 DIAGNOSIS — L570 Actinic keratosis: Secondary | ICD-10-CM

## 2021-05-31 ENCOUNTER — Ambulatory Visit
Admission: RE | Admit: 2021-05-31 | Discharge: 2021-05-31 | Disposition: A | Discharge status: Home / Self Care | Payer: Medicare Other | Source: Ambulatory Visit | Attending: Family Medicine | Admitting: Family Medicine

## 2021-05-31 ENCOUNTER — Other Ambulatory Visit: Payer: Self-pay

## 2021-05-31 DIAGNOSIS — Z1231 Encounter for screening mammogram for malignant neoplasm of breast: Secondary | ICD-10-CM | POA: Diagnosis not present

## 2021-06-03 ENCOUNTER — Encounter: Payer: Self-pay | Admitting: Dermatology

## 2021-06-03 NOTE — Progress Notes (Signed)
   Follow-Up Visit   Subjective  Alexis Hopkins is a 75 y.o. female who presents for the following: Annual Exam (Ft forehead previous ln2 and nose near glasses scale x months, patient has history of bcc scc ).  General skin examination, some facial crusts, history of nonmelanoma skin cancers Location:  Duration:  Quality:  Associated Signs/Symptoms: Modifying Factors:  Severity:  Timing: Context:   Objective  Well appearing patient in no apparent distress; mood and affect are within normal limits. Torso - Posterior (Back) Full body skin examination:: No atypical pigmented lesions, no new or recurrent nonmelanoma skin cancers.  Left Lower Back 6 mm light brown textured flattopped papule  Left Eyebrow, Left Forehead Gritty pink 3 mm crusts    A full examination was performed including scalp, head, eyes, ears, nose, lips, neck, chest, axillae, abdomen, back, buttocks, bilateral upper extremities, bilateral lower extremities, hands, feet, fingers, toes, fingernails, and toenails. All findings within normal limits unless otherwise noted below.  Areas beneath undergarments not fully examined   Assessment & Plan    Encounter for screening for malignant neoplasm of skin Torso - Posterior (Back)  Annual skin examination  Seborrheic keratosis Left Lower Back  Leave if stable  AK (actinic keratosis) (2) Left Eyebrow; Left Forehead  Per Dr Denna Haggard cancel December PDT   Destruction of lesion - Left Eyebrow, Left Forehead Complexity: simple   Destruction method: cryotherapy   Informed consent: discussed and consent obtained   Timeout:  patient name, date of birth, surgical site, and procedure verified Lesion destroyed using liquid nitrogen: Yes   Cryotherapy cycles:  3 Outcome: patient tolerated procedure well with no complications        I, Lavonna Monarch, MD, have reviewed all documentation for this visit.  The documentation on 06/03/21 for the exam, diagnosis,  procedures, and orders are all accurate and complete.

## 2021-07-16 ENCOUNTER — Emergency Department (HOSPITAL_BASED_OUTPATIENT_CLINIC_OR_DEPARTMENT_OTHER): Payer: Medicare Other

## 2021-07-16 ENCOUNTER — Emergency Department (HOSPITAL_BASED_OUTPATIENT_CLINIC_OR_DEPARTMENT_OTHER)
Admission: EM | Admit: 2021-07-16 | Discharge: 2021-07-16 | Disposition: A | Discharge status: Home / Self Care | Payer: Medicare Other | Attending: Emergency Medicine | Admitting: Emergency Medicine

## 2021-07-16 ENCOUNTER — Emergency Department (HOSPITAL_BASED_OUTPATIENT_CLINIC_OR_DEPARTMENT_OTHER): Payer: Medicare Other | Admitting: Radiology

## 2021-07-16 ENCOUNTER — Other Ambulatory Visit: Payer: Self-pay

## 2021-07-16 ENCOUNTER — Encounter (HOSPITAL_BASED_OUTPATIENT_CLINIC_OR_DEPARTMENT_OTHER): Payer: Self-pay | Admitting: Emergency Medicine

## 2021-07-16 DIAGNOSIS — W01198A Fall on same level from slipping, tripping and stumbling with subsequent striking against other object, initial encounter: Secondary | ICD-10-CM | POA: Diagnosis not present

## 2021-07-16 DIAGNOSIS — Z96653 Presence of artificial knee joint, bilateral: Secondary | ICD-10-CM | POA: Diagnosis not present

## 2021-07-16 DIAGNOSIS — Z7984 Long term (current) use of oral hypoglycemic drugs: Secondary | ICD-10-CM | POA: Diagnosis not present

## 2021-07-16 DIAGNOSIS — S42295A Other nondisplaced fracture of upper end of left humerus, initial encounter for closed fracture: Secondary | ICD-10-CM | POA: Diagnosis not present

## 2021-07-16 DIAGNOSIS — Y92009 Unspecified place in unspecified non-institutional (private) residence as the place of occurrence of the external cause: Secondary | ICD-10-CM | POA: Diagnosis not present

## 2021-07-16 DIAGNOSIS — S4992XA Unspecified injury of left shoulder and upper arm, initial encounter: Secondary | ICD-10-CM | POA: Diagnosis present

## 2021-07-16 DIAGNOSIS — Z79899 Other long term (current) drug therapy: Secondary | ICD-10-CM | POA: Diagnosis not present

## 2021-07-16 DIAGNOSIS — S42212A Unspecified displaced fracture of surgical neck of left humerus, initial encounter for closed fracture: Secondary | ICD-10-CM | POA: Diagnosis not present

## 2021-07-16 DIAGNOSIS — S0990XA Unspecified injury of head, initial encounter: Secondary | ICD-10-CM | POA: Insufficient documentation

## 2021-07-16 DIAGNOSIS — Z7982 Long term (current) use of aspirin: Secondary | ICD-10-CM | POA: Insufficient documentation

## 2021-07-16 DIAGNOSIS — Z85828 Personal history of other malignant neoplasm of skin: Secondary | ICD-10-CM | POA: Insufficient documentation

## 2021-07-16 DIAGNOSIS — E041 Nontoxic single thyroid nodule: Secondary | ICD-10-CM | POA: Diagnosis not present

## 2021-07-16 DIAGNOSIS — E119 Type 2 diabetes mellitus without complications: Secondary | ICD-10-CM | POA: Diagnosis not present

## 2021-07-16 DIAGNOSIS — Z7902 Long term (current) use of antithrombotics/antiplatelets: Secondary | ICD-10-CM | POA: Diagnosis not present

## 2021-07-16 DIAGNOSIS — I10 Essential (primary) hypertension: Secondary | ICD-10-CM | POA: Insufficient documentation

## 2021-07-16 MED ORDER — HYDROCODONE-ACETAMINOPHEN 7.5-325 MG PO TABS: 1.0000 | ORAL_TABLET | ORAL | 0 refills | Status: DC | PRN

## 2021-07-16 MED ORDER — HYDROCODONE-ACETAMINOPHEN 5-325 MG PO TABS
1.0000 | ORAL_TABLET | Freq: Once | ORAL | Status: AC
  Administered 2021-07-16: 1 via ORAL
  Filled 2021-07-16: qty 1

## 2021-07-16 NOTE — ED Provider Notes (Signed)
[facility] EMERGENCY DEPT Provider Note   CSN: [id number] Arrival date & time: 07/16/21  0236     History Chief Complaint  Patient presents with   Fall   Arm Injury    Alexis Hopkins is a 75 y.o. female.  The history is provided by the patient.  Fall  Arm Injury She has history of hypertension, diabetes, hyperlipidemia, transient ischemic attack and comes in after falling at home and injuring her left shoulder.  She tripped and fell.  She did hit her head with but without loss of consciousness.  She denies other injury.   Past Medical History:  Diagnosis Date   Anemia    Arthritis    Basal cell carcinoma 01/08/2014   BCC NODULAR NOSE MOHS   Cancer ([place])    SKIN CANCERS REMOVED   Diabetes mellitus without complication (HCC)    Family history of adverse reaction to anesthesia    "mother always was deathly sick"   History of kidney stones 11 YRS AGO   Hx of skin cancer, basal cell    Hyperlipemia    Hypertension    Osteoporosis    RSD (reflex sympathetic dystrophy)    L ARM   Squamous cell carcinoma of skin    ABOVE LEFT BROW CX3 5FU   TIA (transient ischemic attack) 1994   Takes Plavix    Patient Active Problem List   Diagnosis Date Noted   Obese 03/10/2015   S/P left TKA 03/09/2015   S/P right TKA 10/05/2014   Arthritis of shoulder region, right, degenerative 10/03/2013    Past Surgical History:  Procedure Laterality Date   ABDOMINAL HYSTERECTOMY  1995   KNEE ARTHROSCOPY     X 1 on right; X 2 on left   MOHS SURGERY     REVERSE SHOULDER ARTHROPLASTY Right 10/03/2013   Procedure: RIGHT REVERSE SHOULDER ARTHROPLASTY;  Surgeon: Augustin Schooling, MD;  Location: [facility];  Service: Orthopedics;  Laterality: Right;   ROTATOR CUFF REPAIR Right    SHOULDER ARTHROSCOPY Right    TOTAL KNEE ARTHROPLASTY Right 10/05/2014   Procedure: RIGHT TOTAL KNEE ARTHROPLASTY;  Surgeon: Mauri Pole, MD;  Location: [facility];  Service: Orthopedics;  Laterality: Right;    TOTAL KNEE ARTHROPLASTY Left 03/09/2015   Procedure: LEFT TOTAL KNEE ARTHROPLASTY;  Surgeon: Paralee Cancel, MD;  Location: [facility];  Service: Orthopedics;  Laterality: Left;     OB History   No obstetric history on file.     History reviewed. No pertinent family history.  Social History   Tobacco Use   Smoking status: Never   Smokeless tobacco: Never  Vaping Use   Vaping Use: Never used  Substance Use Topics   Alcohol use: No   Drug use: No    Home Medications Prior to Admission medications   Medication Sig Start Date End Date Taking? Authorizing Provider  amLODipine (NORVASC) 10 MG tablet Take 10 mg by mouth every morning.     [provider]  aspirin 325 MG tablet Take 325 mg by mouth every morning.     [provider]  atorvastatin (LIPITOR) 40 MG tablet Take 40 mg by mouth every evening.    [provider]  calcium-vitamin D (OSCAL WITH D) 500-200 MG-UNIT per tablet Take 1 tablet by mouth every morning.     [provider]  clopidogrel (PLAVIX) 75 MG tablet Take 75 mg by mouth daily with breakfast.    [provider]  colesevelam [facility]) 625  MG tablet Take 1,875 mg by mouth 2 (two) times daily with a meal.    [provider]  docusate sodium (COLACE) 100 MG capsule Take 1 capsule (100 mg total) by mouth 2 (two) times daily. 03/10/15   Danae Orleans, PA-C  ferrous sulfate 325 (65 FE) MG tablet Take 1 tablet (325 mg total) by mouth 3 (three) times daily after meals. 03/10/15   Danae Orleans, PA-C  glimepiride (AMARYL) 2 MG tablet Take 2 mg by mouth daily with breakfast.    [provider]  hydrochlorothiazide (HYDRODIURIL) 25 MG tablet Take 25 mg by mouth every morning.     [provider]  HYDROcodone-acetaminophen (NORCO) 7.5-325 MG tablet Take 1 tablet by mouth every 4 (four) hours as needed for moderate pain. 08/65/78   Delora Fuel, MD  ketoconazole (NIZORAL) 2 % cream Apply 1 application topically  daily.    [provider]  lisinopril (PRINIVIL,ZESTRIL) 40 MG tablet Take 40 mg by mouth every morning.     [provider]  metFORMIN (GLUCOPHAGE) 1000 MG tablet Take 1,000 mg by mouth 2 (two) times daily with a meal.    [provider]  Misc Natural Products (OSTEO BI-FLEX TRIPLE STRENGTH PO) Take 1 tablet by mouth 2 (two) times daily.    [provider]  Multiple Vitamin (MULTIVITAMIN WITH MINERALS) TABS tablet Take 1 tablet by mouth every morning.     [provider]  Omega-3 Fatty Acids (OMEGA 3 PO) Take 2 tablets by mouth 2 (two) times daily. Omega XL    [provider]  polyethylene glycol (MIRALAX / GLYCOLAX) packet Take 17 g by mouth 2 (two) times daily. 03/10/15   Danae Orleans, PA-C  promethazine (PHENERGAN) 25 MG tablet Take 1 tablet (25 mg total) by mouth every 6 (six) hours as needed for nausea or vomiting. 10/05/13   Netta Cedars, MD  sodium chloride (OCEAN) 0.65 % SOLN nasal spray Place 1-2 sprays into both nostrils daily as needed for congestion (cold.).    [provider]  tiZANidine (ZANAFLEX) 4 MG tablet Take 1 tablet (4 mg total) by mouth every 6 (six) hours as needed for muscle spasms. 03/10/15   Danae Orleans, PA-C    Allergies    Motrin [ibuprofen] and Shellfish allergy  Review of Systems   Review of Systems  All other systems reviewed and are negative.  Physical Exam Updated Vital Signs BP 124/71   Pulse 87   Temp 98.4 F (36.9 C) (Oral)   Resp 20   Ht 5\' 2"  (1.575 m)   Wt 86.2 kg   SpO2 94%   BMI 34.75 kg/m   Physical Exam Vitals and nursing note reviewed.  75 year old female, resting comfortably and in no acute distress. Vital signs are normal. Oxygen saturation is 94%, which is normal. Head is normocephalic and atraumatic. PERRLA, EOMI. Oropharynx is clear. Neck is nontender without adenopathy or JVD. Back is nontender and there is no CVA tenderness. Lungs are clear without rales, wheezes,  or rhonchi. Chest is nontender. Heart has regular rate and rhythm without murmur. Abdomen is soft, flat, nontender without masses or hepatosplenomegaly and peristalsis is normoactive. Extremities: There is soft tissue swelling around the left shoulder with marked tenderness to palpation diffusely.  There is pain on any passive range of motion.  Distal neurovascular exam is intact with strong pulses, prompt capillary refill, normal sensation. Skin is warm and dry without rash. Neurologic: Mental status is normal, cranial nerves are intact,  there are no motor or sensory deficits.  ED Results / Procedures / Treatments   Labs  Radiology CT Head Wo Contrast  Result Date: 07/16/2021 CLINICAL DATA:  Head trauma EXAM: CT HEAD WITHOUT CONTRAST TECHNIQUE: Contiguous axial images were obtained from the base of the skull through the vertex without intravenous contrast. COMPARISON:  None. FINDINGS: Brain: There is no mass, hemorrhage or extra-axial collection. The size and configuration of the ventricles and extra-axial CSF spaces are normal. The brain parenchyma is normal, without acute or chronic infarction. Vascular: No abnormal hyperdensity of the major intracranial arteries or dural venous sinuses. No intracranial atherosclerosis. Skull: The visualized skull base, calvarium and extracranial soft tissues are normal. Sinuses/Orbits: No fluid levels or advanced mucosal thickening of the visualized paranasal sinuses. No mastoid or middle ear effusion. The orbits are normal. IMPRESSION: Normal head CT. Electronically Signed   By: Ulyses Jarred M.D.   On: 07/16/2021 03:22   CT Cervical Spine Wo Contrast  Result Date: 07/16/2021 CLINICAL DATA:  Head trauma EXAM: CT CERVICAL SPINE WITHOUT CONTRAST TECHNIQUE: Multidetector CT imaging of the cervical spine was performed without intravenous contrast. Multiplanar CT image reconstructions were also generated. COMPARISON:  None. FINDINGS: Alignment: No static  subluxation. Facets are aligned. Occipital condyles and the lateral masses of C1 and C2 are normally approximated. Skull base and vertebrae: No acute fracture. Soft tissues and spinal canal: No prevertebral fluid or swelling. No visible canal hematoma. Disc levels: No advanced spinal canal or neural foraminal stenosis. Upper chest: No pneumothorax, pulmonary nodule or pleural effusion. Other: 2.3 cm right thyroid nodule. IMPRESSION: 1. No acute fracture or static subluxation of the cervical spine. 2. 2.3 cm incidental right thyroid nodule. Recommend thyroid US. Reference: J Am Coll Radiol. 2015 Feb;12(2): 143-50 Electronically Signed   By: Ulyses Jarred M.D.   On: 07/16/2021 03:24   DG Humerus Left  Result Date: 07/16/2021 CLINICAL DATA:  Fall EXAM: LEFT HUMERUS - 2+ VIEW COMPARISON:  None. FINDINGS: Comminuted fracture of the head and surgical neck of the left humerus. No dislocation at the glenohumeral joint. IMPRESSION: Comminuted fracture of the left humeral head and surgical neck. Electronically Signed   By: Ulyses Jarred M.D.   On: 07/16/2021 03:28    Procedures .Ortho Injury Treatment  Date/Time: 07/16/2021 6:05 AM Performed by: Delora Fuel, MD Authorized by: Delora Fuel, MD      Medications Ordered in ED Medications  HYDROcodone-acetaminophen (NORCO/VICODIN) 5-325 MG per tablet 1 tablet (has no administration in time range)  HYDROcodone-acetaminophen (NORCO/VICODIN) 5-325 MG per tablet 1 tablet (1 tablet Oral Given 07/16/21 0410)    ED Course  I have reviewed the triage vital signs and the nursing notes.  Pertinent imaging results that were available during my care of the patient were reviewed by me and considered in my medical decision making (see chart for details).   MDM Rules/Calculators/A&P                         Fall with injury to left shoulder.  She also hit her head.  She is taking clopidogrel.  Because of this, she is sent for CT of head and cervical spine which are  negative for acute injury.  Incidental finding of thyroid nodule, will need follow-up ultrasound.  X-rays of left shoulder showed comminuted fracture of the surgical neck and head of the humerus.  She is placed in a sling and discharged with a prescription for hydrocodone-acetaminophen.  Referred back  to her orthopedic physician for follow-up.  Final Clinical Impression(s) / ED Diagnoses Final diagnoses:  Other closed nondisplaced fracture of proximal end of left humerus, initial encounter  Thyroid nodule    Rx / DC Orders ED Discharge Orders          Ordered    HYDROcodone-acetaminophen (NORCO) 7.5-325 MG tablet  Every 4 hours PRN,   Status:  Discontinued        07/16/21 0557    HYDROcodone-acetaminophen (NORCO) 7.5-325 MG tablet  Every 4 hours PRN,   Status:  Discontinued        07/16/21 0557    HYDROcodone-acetaminophen (NORCO) 7.5-325 MG tablet  Every 4 hours PRN        07/16/21 6151             Delora Fuel, MD 83/43/73 [phone]

## 2021-07-16 NOTE — Discharge Instructions (Addendum)
Wear the sling at all times.  Apply ice for 30 minutes at a time, 4 times a day.  Call your orthopedic physician on Monday to get an appointment as soon as possible.  Your CT scan showed that you have a nodule in your thyroid.  He will need to follow-up with your primary care provider to schedule an ultrasound of your thyroid.

## 2021-07-16 NOTE — ED Triage Notes (Signed)
  Patient comes in after mechanical fall at home that occurred about 30 minutes ago.  Patient states she tripped over a box of fruit in her kitchen and landed on her L shoulder.  Patient has full rotation of L shoulder but endorses pain in her upper arm.  Patient states she hit her head but has no hematoma or pain.  Patient also takes plavix. Pain 9/10, throbbing pain in proximal humerus.

## 2021-07-18 DIAGNOSIS — S42222A 2-part displaced fracture of surgical neck of left humerus, initial encounter for closed fracture: Secondary | ICD-10-CM | POA: Diagnosis not present

## 2021-07-19 DIAGNOSIS — S42202A Unspecified fracture of upper end of left humerus, initial encounter for closed fracture: Secondary | ICD-10-CM | POA: Diagnosis not present

## 2021-07-19 NOTE — H&P (Signed)
Patient's anticipated LOS is less than 2 midnights, meeting these requirements: - Younger than 22 - Lives within 1 hour of care - Has a competent adult at home to recover with post-op recover - NO history of  - Chronic pain requiring opiods  - Diabetes  - Coronary Artery Disease  - Heart failure  - Heart attack  - Stroke  - DVT/VTE  - Cardiac arrhythmia  - Respiratory Failure/COPD  - Renal failure  - Anemia  - Advanced Liver disease     Alexis Hopkins is an 75 y.o. female.    Chief Complaint: left shoulder pain  HPI: Pt is a 75 y.o. female complaining of left shoulder pain s/p recent fall. Pain had continually increased since the beginning. X-rays in the clinic show displaced left proximal humerus fracture.  Various options are discussed with the patient. Risks, benefits and expectations were discussed with the patient. Patient understand the risks, benefits and expectations and wishes to proceed with surgery.   PCP:  Mayra Neer, MD  D/C Plans: Home  PMH: Past Medical History:  Diagnosis Date   Anemia    Arthritis    Basal cell carcinoma 01/08/2014   BCC NODULAR NOSE MOHS   Cancer ([place])    SKIN CANCERS REMOVED   Diabetes mellitus without complication (HCC)    Family history of adverse reaction to anesthesia    "mother always was deathly sick"   History of kidney stones 36 YRS AGO   Hx of skin cancer, basal cell    Hyperlipemia    Hypertension    Osteoporosis    RSD (reflex sympathetic dystrophy)    L ARM   Squamous cell carcinoma of skin    ABOVE LEFT BROW CX3 5FU   TIA (transient ischemic attack) 1994   Takes Plavix    PSH: Past Surgical History:  Procedure Laterality Date   ABDOMINAL HYSTERECTOMY  1995   KNEE ARTHROSCOPY     X 1 on right; X 2 on left   MOHS SURGERY     REVERSE SHOULDER ARTHROPLASTY Right 10/03/2013   Procedure: RIGHT REVERSE SHOULDER ARTHROPLASTY;  Surgeon: Augustin Schooling, MD;  Location: [facility];  Service: Orthopedics;   Laterality: Right;   ROTATOR CUFF REPAIR Right    SHOULDER ARTHROSCOPY Right    TOTAL KNEE ARTHROPLASTY Right 10/05/2014   Procedure: RIGHT TOTAL KNEE ARTHROPLASTY;  Surgeon: Mauri Pole, MD;  Location: [facility];  Service: Orthopedics;  Laterality: Right;   TOTAL KNEE ARTHROPLASTY Left 03/09/2015   Procedure: LEFT TOTAL KNEE ARTHROPLASTY;  Surgeon: Paralee Cancel, MD;  Location: [facility];  Service: Orthopedics;  Laterality: Left;    Social History:  reports that she has never smoked. She has never used smokeless tobacco. She reports that she does not drink alcohol and does not use drugs.  Allergies:  Allergies  Allergen Reactions   Motrin [Ibuprofen] Other (See Comments)    Per MD-- does not want her taking.    Shellfish Allergy Hives    Shrimp.    Medications: No current facility-administered medications for this encounter.   Current Outpatient Medications  Medication Sig Dispense Refill   amLODipine (NORVASC) 10 MG tablet Take 10 mg by mouth every morning.      aspirin 325 MG tablet Take 325 mg by mouth every morning.      b complex vitamins capsule Take 1 capsule by mouth daily.     calcium-vitamin D (OSCAL WITH D) 500-200 MG-UNIT per tablet Take 1 tablet by  mouth every morning.      clopidogrel (PLAVIX) 75 MG tablet Take 75 mg by mouth daily with breakfast.     clotrimazole (LOTRIMIN) 1 % cream Apply 1 application topically 2 (two) times daily.     Coenzyme Q10 (CO Q 10) 100 MG CAPS Take 100 mg by mouth daily.     colesevelam (WELCHOL) 625 MG tablet Take 1,875 mg by mouth 2 (two) times daily with a meal.     cycloSPORINE (RESTASIS) 0.05 % ophthalmic emulsion 1 drop 2 (two) times daily.     glimepiride (AMARYL) 2 MG tablet Take 2 mg by mouth 2 (two) times daily.     hydrochlorothiazide (HYDRODIURIL) 25 MG tablet Take 25 mg by mouth every morning.      HYDROcodone-acetaminophen (NORCO) 7.5-325 MG tablet Take 1 tablet by mouth every 4 (four) hours as needed for moderate pain. (Patient  taking differently: Take 1 tablet by mouth every 4 (four) hours.) 20 tablet 0   ketoconazole (NIZORAL) 2 % cream Apply 1 application topically daily.     lisinopril (PRINIVIL,ZESTRIL) 40 MG tablet Take 40 mg by mouth every morning.      metFORMIN (GLUCOPHAGE) 1000 MG tablet Take 1,000 mg by mouth 2 (two) times daily with a meal.     Misc Natural Products (OSTEO BI-FLEX TRIPLE STRENGTH PO) Take 1 tablet by mouth 2 (two) times daily.     Multiple Vitamin (MULTIVITAMIN WITH MINERALS) TABS tablet Take 1 tablet by mouth every morning.      Omega 3 1000 MG CAPS Take 2,000 tablets by mouth 2 (two) times daily. Omega XL     OVER THE COUNTER MEDICATION Place 1 application into both eyes at bedtime. Pure and gentle lubricant eye pad     polyethylene glycol (MIRALAX / GLYCOLAX) packet Take 17 g by mouth 2 (two) times daily. (Patient taking differently: Take 17 g by mouth daily as needed for mild constipation or moderate constipation.) 14 each 0   Polyvinyl Alcohol-Povidone PF (REFRESH) 1.4-0.6 % SOLN Place 1 drop into both eyes daily as needed (Dry eye).     pravastatin (PRAVACHOL) 40 MG tablet Take 40 mg by mouth daily.     sodium chloride (OCEAN) 0.65 % SOLN nasal spray Place 1-2 sprays into both nostrils daily as needed (Dry nose).     amoxicillin (AMOXIL) 500 MG capsule Take 2,000 mg by mouth See admin instructions. Take 4 capsules (2000 mg) by mouth 1 hour prior to dental appointments      No results found for this or any previous visit (from the past 48 hour(s)). No results found.  ROS: Pain with rom of the left upper extremity  Physical Exam: Alert and oriented 75 y.o. female in no acute distress Cranial nerves 2-12 intact Cervical spine: full rom with no tenderness, nv intact distally Chest: active breath sounds bilaterally, no wheeze rhonchi or rales Heart: regular rate and rhythm, no murmur Abd: non tender non distended with active bowel sounds Hip is stable with rom  Left shoulder with  limited rom due to fracture Sling in place No signs of open injury Nv intact distally  Assessment/Plan Assessment: left proximal humerus fracture   Plan:  Patient will undergo a left reverse shoulder by Dr. Veverly Fells at [facility] Risks benefits and expectations were discussed with the patient. Patient understand risks, benefits and expectations and wishes to proceed. Preoperative templating of the joint replacement has been completed, documented, and submitted to the Operating Room personnel in order to optimize  intra-operative equipment management.   Merla Riches PA-C, MPAS [facility] is now [employer] [address], [place], [place], [place] [address] Phone: [phone] [email or web address] Facebook  [employer]

## 2021-07-20 ENCOUNTER — Encounter (HOSPITAL_COMMUNITY): Payer: Self-pay | Admitting: Orthopedic Surgery

## 2021-07-20 ENCOUNTER — Other Ambulatory Visit: Payer: Self-pay

## 2021-07-20 NOTE — Progress Notes (Signed)
PCP - Dr. Brigitte Pulse  Cardiologist - Denies  EP-Denies  Endocrine-Denies  Pulm-Denies  Chest x-ray - Denies  EKG - 07/21/21 DOS  Stress Test - Denies  ECHO - Denies  Cardiac Cath - Denies  AICD-na PM-na LOOP-na  Dialysis-Denies  Sleep Study - Denies CPAP - Denies  LABS- 07/21/21: CBC, BMP, COVID, PCR  ASA- LD- 10/15 Plavix- LD- 10/15  ERAS- Yes- clears until 1345, Pt also states she was given instructions for a light breakfast by the surgeon  HA1C- 03/04/15 (E): 6.7 Fasting Blood Sugar - 95-145 Checks Blood Sugar __2___ times a day  Anesthesia- No  Pt denies having chest pain, sob, or fever, but does endorse nasal congestion x1 week. Pt denies any other symptoms and states she had a negative home Covid test 2 days ago. during the pre-op phone call. All instructions explained to the pt, with a verbal understanding of the material including: as of today, stop taking all Aspirin (unless instructed by your doctor) and Other Aspirin containing products, Vitamins, Fish oils, and Herbal medications. Also stop all NSAIDS i.e. Advil, Ibuprofen, Motrin, Aleve, Anaprox, Naproxen, BC, Goody Powders, and all Supplements.   How do I manage my blood sugar before surgery? Check your blood sugar the morning of your surgery when you wake up and every 2 hours until you get to the Short Stay unit. If your blood sugar is less than 70 mg/dL, you will need to treat for low blood sugar: Do not take insulin. Treat a low blood sugar (less than 70 mg/dL) with  cup of clear juice (cranberry or apple), 4 glucose tablets, OR glucose gel. Recheck blood sugar in 15 minutes after treatment (to make sure it is greater than 70 mg/dL). If your blood sugar is not greater than 70 mg/dL on recheck, call [phone]  for further instructions. Report your blood sugar to the short stay nurse when you get to Short Stay.  WHAT DO I DO ABOUT MY DIABETES MEDICATION?  Do not take Glimepiride (Amaryl) and  Glucophage (Metformin) the morning of surgery.  If your CBG is greater than 220 mg/dL, call the number above for further instructions.   Reviewed and Endorsed by [facility] Patient Education Committee, August 2015  Pt also instructed to wear a mask and social distance if she has to go out prior to her surgery. The opportunity to ask questions was provided.    Coronavirus Screening  Have you experienced the following symptoms:  Cough yes/no: No Fever (>100.75F)  yes/no: No Runny nose yes/no: No Sore throat yes/no: No Difficulty breathing/shortness of breath  yes/no: No  Have you or a family member traveled in the last 14 days and where? yes/no: No   If the patient indicates "YES" to the above questions, their PAT will be rescheduled to limit the exposure to others and, the surgeon will be notified. THE PATIENT WILL NEED TO BE ASYMPTOMATIC FOR 14 DAYS.   If the patient is not experiencing any of these symptoms, the PAT nurse will instruct them to NOT bring anyone with them to their appointment since they may have these symptoms or traveled as well.   Please remind your patients and families that hospital visitation restrictions are in effect and the importance of the restrictions.

## 2021-07-21 ENCOUNTER — Encounter (HOSPITAL_COMMUNITY): Payer: Self-pay | Admitting: Orthopedic Surgery

## 2021-07-21 ENCOUNTER — Ambulatory Visit (HOSPITAL_COMMUNITY): Payer: Medicare Other | Admitting: Anesthesiology

## 2021-07-21 ENCOUNTER — Observation Stay (HOSPITAL_COMMUNITY)
Admission: RE | Admit: 2021-07-21 | Discharge: 2021-07-22 | Disposition: A | Discharge status: Home / Self Care | Payer: Medicare Other | Attending: Orthopedic Surgery | Admitting: Orthopedic Surgery

## 2021-07-21 ENCOUNTER — Encounter (HOSPITAL_COMMUNITY)
Admission: RE | Disposition: A | Discharge status: Home / Self Care | Payer: Self-pay | Source: Home / Self Care | Attending: Orthopedic Surgery

## 2021-07-21 DIAGNOSIS — I10 Essential (primary) hypertension: Secondary | ICD-10-CM | POA: Diagnosis not present

## 2021-07-21 DIAGNOSIS — G8918 Other acute postprocedural pain: Secondary | ICD-10-CM | POA: Diagnosis not present

## 2021-07-21 DIAGNOSIS — Z85828 Personal history of other malignant neoplasm of skin: Secondary | ICD-10-CM | POA: Diagnosis not present

## 2021-07-21 DIAGNOSIS — S42292A Other displaced fracture of upper end of left humerus, initial encounter for closed fracture: Secondary | ICD-10-CM | POA: Diagnosis not present

## 2021-07-21 DIAGNOSIS — Z96653 Presence of artificial knee joint, bilateral: Secondary | ICD-10-CM | POA: Insufficient documentation

## 2021-07-21 DIAGNOSIS — Z7984 Long term (current) use of oral hypoglycemic drugs: Secondary | ICD-10-CM | POA: Diagnosis not present

## 2021-07-21 DIAGNOSIS — S42142A Displaced fracture of glenoid cavity of scapula, left shoulder, initial encounter for closed fracture: Secondary | ICD-10-CM | POA: Diagnosis not present

## 2021-07-21 DIAGNOSIS — Z7982 Long term (current) use of aspirin: Secondary | ICD-10-CM | POA: Diagnosis not present

## 2021-07-21 DIAGNOSIS — W1839XA Other fall on same level, initial encounter: Secondary | ICD-10-CM | POA: Insufficient documentation

## 2021-07-21 DIAGNOSIS — S42202A Unspecified fracture of upper end of left humerus, initial encounter for closed fracture: Secondary | ICD-10-CM | POA: Diagnosis not present

## 2021-07-21 DIAGNOSIS — Z20822 Contact with and (suspected) exposure to covid-19: Secondary | ICD-10-CM | POA: Insufficient documentation

## 2021-07-21 DIAGNOSIS — Z8673 Personal history of transient ischemic attack (TIA), and cerebral infarction without residual deficits: Secondary | ICD-10-CM | POA: Insufficient documentation

## 2021-07-21 DIAGNOSIS — Z96611 Presence of right artificial shoulder joint: Secondary | ICD-10-CM | POA: Diagnosis not present

## 2021-07-21 DIAGNOSIS — S42352A Displaced comminuted fracture of shaft of humerus, left arm, initial encounter for closed fracture: Secondary | ICD-10-CM | POA: Diagnosis not present

## 2021-07-21 DIAGNOSIS — Z79899 Other long term (current) drug therapy: Secondary | ICD-10-CM | POA: Diagnosis not present

## 2021-07-21 DIAGNOSIS — E119 Type 2 diabetes mellitus without complications: Secondary | ICD-10-CM | POA: Insufficient documentation

## 2021-07-21 DIAGNOSIS — Z7902 Long term (current) use of antithrombotics/antiplatelets: Secondary | ICD-10-CM | POA: Insufficient documentation

## 2021-07-21 DIAGNOSIS — Z96612 Presence of left artificial shoulder joint: Secondary | ICD-10-CM

## 2021-07-21 DIAGNOSIS — S4992XA Unspecified injury of left shoulder and upper arm, initial encounter: Secondary | ICD-10-CM | POA: Diagnosis present

## 2021-07-21 HISTORY — PX: REVERSE SHOULDER ARTHROPLASTY: SHX5054

## 2021-07-21 HISTORY — DX: Nausea with vomiting, unspecified: Z98.890

## 2021-07-21 HISTORY — DX: Nausea with vomiting, unspecified: R11.2

## 2021-07-21 LAB — CBC
HCT: 38.4 % (ref 36.0–46.0)
Hemoglobin: 11.8 g/dL — ABNORMAL LOW (ref 12.0–15.0)
MCH: 27.2 pg (ref 26.0–34.0)
MCHC: 30.7 g/dL (ref 30.0–36.0)
MCV: 88.5 fL (ref 80.0–100.0)
Platelets: 332 10*3/uL (ref 150–400)
RBC: 4.34 MIL/uL (ref 3.87–5.11)
RDW: 14.9 % (ref 11.5–15.5)
WBC: 9 10*3/uL (ref 4.0–10.5)
nRBC: 0 % (ref 0.0–0.2)

## 2021-07-21 LAB — GLUCOSE, CAPILLARY
Glucose-Capillary: 201 mg/dL — ABNORMAL HIGH (ref 70–99)
Glucose-Capillary: 77 mg/dL (ref 70–99)

## 2021-07-21 LAB — BASIC METABOLIC PANEL
Anion gap: 12 (ref 5–15)
BUN: 34 mg/dL — ABNORMAL HIGH (ref 8–23)
CO2: 20 mmol/L — ABNORMAL LOW (ref 22–32)
Calcium: 9.5 mg/dL (ref 8.9–10.3)
Chloride: 105 mmol/L (ref 98–111)
Creatinine, Ser: 1.43 mg/dL — ABNORMAL HIGH (ref 0.44–1.00)
GFR, Estimated: 38 mL/min — ABNORMAL LOW (ref >60)
Glucose, Bld: 70 mg/dL (ref 70–99)
Potassium: 4.4 mmol/L (ref 3.5–5.1)
Sodium: 137 mmol/L (ref 135–145)

## 2021-07-21 LAB — SURGICAL PCR SCREEN
MRSA, PCR: NEGATIVE
Staphylococcus aureus: POSITIVE — AB

## 2021-07-21 LAB — SARS CORONAVIRUS 2 BY RT PCR (HOSPITAL ORDER, PERFORMED IN ~~LOC~~ HOSPITAL LAB): SARS Coronavirus 2: NEGATIVE

## 2021-07-21 SURGERY — ARTHROPLASTY, SHOULDER, TOTAL, REVERSE
Anesthesia: General | Site: Shoulder | Laterality: Left

## 2021-07-21 MED ORDER — MUPIROCIN 2 % EX OINT
TOPICAL_OINTMENT | CUTANEOUS | Status: AC
  Administered 2021-07-21: 1 via TOPICAL
  Filled 2021-07-21: qty 22

## 2021-07-21 MED ORDER — MUPIROCIN 2 % EX OINT
1.0000 | TOPICAL_OINTMENT | Freq: Two times a day (BID) | CUTANEOUS | Status: DC
Start: 2021-07-21 — End: 2021-07-21

## 2021-07-21 MED ORDER — ADULT MULTIVITAMIN W/MINERALS CH
1.0000 | ORAL_TABLET | Freq: Every morning | ORAL | Status: DC
  Administered 2021-07-22: 1 via ORAL

## 2021-07-21 MED ORDER — ONDANSETRON HCL 4 MG/2ML IJ SOLN: 4.0000 mg | Freq: Four times a day (QID) | INTRAMUSCULAR | Status: DC | PRN

## 2021-07-21 MED ORDER — METOCLOPRAMIDE HCL 5 MG PO TABS: 5.0000 mg | ORAL_TABLET | Freq: Three times a day (TID) | ORAL | Status: DC | PRN

## 2021-07-21 MED ORDER — COLESEVELAM HCL 625 MG PO TABS
1875.0000 mg | ORAL_TABLET | Freq: Two times a day (BID) | ORAL | Status: DC
  Administered 2021-07-22: 1875 mg via ORAL
  Filled 2021-07-21 (×2): qty 3

## 2021-07-21 MED ORDER — PRAVASTATIN SODIUM 40 MG PO TABS
40.0000 mg | ORAL_TABLET | Freq: Every day | ORAL | Status: DC
  Administered 2021-07-22: 40 mg via ORAL
  Filled 2021-07-21: qty 1

## 2021-07-21 MED ORDER — BUPIVACAINE-EPINEPHRINE (PF) 0.25% -1:200000 IJ SOLN
INTRAMUSCULAR | Status: AC
  Filled 2021-07-21: qty 30

## 2021-07-21 MED ORDER — PROPOFOL 10 MG/ML IV BOLUS
INTRAVENOUS | Status: AC
  Filled 2021-07-21: qty 20

## 2021-07-21 MED ORDER — PHENYLEPHRINE HCL-NACL 20-0.9 MG/250ML-% IV SOLN
INTRAVENOUS | Status: DC | PRN
  Administered 2021-07-21: 50 ug/min via INTRAVENOUS

## 2021-07-21 MED ORDER — MENTHOL 3 MG MT LOZG: 1.0000 | LOZENGE | OROMUCOSAL | Status: DC | PRN

## 2021-07-21 MED ORDER — HYDROCODONE-ACETAMINOPHEN 5-325 MG PO TABS
1.0000 | ORAL_TABLET | ORAL | Status: DC | PRN
  Administered 2021-07-22: 2 via ORAL
  Administered 2021-07-22: 1 via ORAL
  Filled 2021-07-21: qty 1
  Filled 2021-07-21: qty 2

## 2021-07-21 MED ORDER — FENTANYL CITRATE (PF) 100 MCG/2ML IJ SOLN
INTRAMUSCULAR | Status: AC
  Administered 2021-07-21: 50 ug
  Filled 2021-07-21: qty 2

## 2021-07-21 MED ORDER — CLOPIDOGREL BISULFATE 75 MG PO TABS
75.0000 mg | ORAL_TABLET | Freq: Every day | ORAL | Status: DC
  Filled 2021-07-21: qty 1

## 2021-07-21 MED ORDER — METHOCARBAMOL 1000 MG/10ML IJ SOLN
500.0000 mg | Freq: Four times a day (QID) | INTRAVENOUS | Status: DC | PRN
  Filled 2021-07-21: qty 5

## 2021-07-21 MED ORDER — STERILE WATER FOR IRRIGATION IR SOLN
Status: DC | PRN
  Administered 2021-07-21: 1000 mL

## 2021-07-21 MED ORDER — LISINOPRIL 20 MG PO TABS
40.0000 mg | ORAL_TABLET | Freq: Every morning | ORAL | Status: DC
  Administered 2021-07-22: 40 mg via ORAL
  Filled 2021-07-21: qty 2

## 2021-07-21 MED ORDER — CO Q 10 100 MG PO CAPS: 100.0000 mg | ORAL_CAPSULE | Freq: Every day | ORAL | Status: DC

## 2021-07-21 MED ORDER — POLYETHYLENE GLYCOL 3350 17 G PO PACK
17.0000 g | PACK | Freq: Every day | ORAL | Status: DC | PRN
Start: 2021-07-21 — End: 2021-07-22

## 2021-07-21 MED ORDER — B COMPLEX-C PO TABS
1.0000 | ORAL_TABLET | Freq: Every day | ORAL | Status: DC
  Administered 2021-07-22: 1 via ORAL
  Filled 2021-07-21: qty 1

## 2021-07-21 MED ORDER — CALCIUM CARBONATE-VITAMIN D 500-200 MG-UNIT PO TABS
1.0000 | ORAL_TABLET | Freq: Every day | ORAL | Status: DC
  Filled 2021-07-21 (×2): qty 1

## 2021-07-21 MED ORDER — PROPOFOL 10 MG/ML IV BOLUS
INTRAVENOUS | Status: DC | PRN
  Administered 2021-07-21: 150 mg via INTRAVENOUS

## 2021-07-21 MED ORDER — MIDAZOLAM HCL 2 MG/2ML IJ SOLN: 2.0000 mg | Freq: Once | INTRAMUSCULAR | Status: AC

## 2021-07-21 MED ORDER — 0.9 % SODIUM CHLORIDE (POUR BTL) OPTIME
TOPICAL | Status: DC | PRN
  Administered 2021-07-21: 1000 mL

## 2021-07-21 MED ORDER — FENTANYL CITRATE (PF) 250 MCG/5ML IJ SOLN
INTRAMUSCULAR | Status: DC | PRN
  Administered 2021-07-21: 150 ug via INTRAVENOUS

## 2021-07-21 MED ORDER — HYDROCODONE-ACETAMINOPHEN 7.5-325 MG PO TABS: 1.0000 | ORAL_TABLET | ORAL | 0 refills | Status: DC | PRN

## 2021-07-21 MED ORDER — METFORMIN HCL 500 MG PO TABS
1000.0000 mg | ORAL_TABLET | Freq: Two times a day (BID) | ORAL | Status: DC
  Administered 2021-07-22: 1000 mg via ORAL
  Filled 2021-07-21: qty 2

## 2021-07-21 MED ORDER — OSTEO BI-FLEX TRIPLE STRENGTH PO TABS: ORAL_TABLET | Freq: Two times a day (BID) | ORAL | Status: DC

## 2021-07-21 MED ORDER — ORAL CARE MOUTH RINSE: 15.0000 mL | Freq: Once | OROMUCOSAL | Status: AC

## 2021-07-21 MED ORDER — FENTANYL CITRATE (PF) 100 MCG/2ML IJ SOLN
INTRAMUSCULAR | Status: AC
  Filled 2021-07-21: qty 2

## 2021-07-21 MED ORDER — TRANEXAMIC ACID-NACL 1000-0.7 MG/100ML-% IV SOLN
1000.0000 mg | Freq: Once | INTRAVENOUS | Status: AC
  Administered 2021-07-21: 1000 mg via INTRAVENOUS
  Filled 2021-07-21: qty 100

## 2021-07-21 MED ORDER — CLOTRIMAZOLE 1 % EX CREA
1.0000 "application " | TOPICAL_CREAM | Freq: Two times a day (BID) | CUTANEOUS | Status: DC
  Administered 2021-07-22: 1 via TOPICAL
  Filled 2021-07-21: qty 15

## 2021-07-21 MED ORDER — CEFAZOLIN SODIUM-DEXTROSE 2-4 GM/100ML-% IV SOLN
2.0000 g | Freq: Four times a day (QID) | INTRAVENOUS | Status: DC
Start: 2021-07-22 — End: 2021-07-22
  Administered 2021-07-22 (×2): 2 g via INTRAVENOUS
  Filled 2021-07-21 (×3): qty 100

## 2021-07-21 MED ORDER — MIDAZOLAM HCL 2 MG/2ML IJ SOLN
INTRAMUSCULAR | Status: AC
  Administered 2021-07-21: 2 mg via INTRAVENOUS
  Filled 2021-07-21: qty 2

## 2021-07-21 MED ORDER — AMOXICILLIN 500 MG PO CAPS: 2000.0000 mg | ORAL_CAPSULE | ORAL | Status: DC

## 2021-07-21 MED ORDER — ASPIRIN 325 MG PO TABS
325.0000 mg | ORAL_TABLET | Freq: Every morning | ORAL | Status: DC
  Administered 2021-07-22: 325 mg via ORAL
  Filled 2021-07-21: qty 1

## 2021-07-21 MED ORDER — PHENYLEPHRINE 40 MCG/ML (10ML) SYRINGE FOR IV PUSH (FOR BLOOD PRESSURE SUPPORT)
PREFILLED_SYRINGE | INTRAVENOUS | Status: DC | PRN
  Administered 2021-07-21 (×2): 80 ug via INTRAVENOUS

## 2021-07-21 MED ORDER — GLIMEPIRIDE 2 MG PO TABS
2.0000 mg | ORAL_TABLET | Freq: Two times a day (BID) | ORAL | Status: DC
  Administered 2021-07-22: 2 mg via ORAL
  Filled 2021-07-21 (×2): qty 1

## 2021-07-21 MED ORDER — SALINE SPRAY 0.65 % NA SOLN: 1.0000 | Freq: Every day | NASAL | Status: DC | PRN

## 2021-07-21 MED ORDER — POLYETHYLENE GLYCOL 3350 17 G PO PACK
17.0000 g | PACK | Freq: Two times a day (BID) | ORAL | Status: DC
  Administered 2021-07-22: 17 g via ORAL
  Filled 2021-07-21: qty 1

## 2021-07-21 MED ORDER — HYDROCHLOROTHIAZIDE 25 MG PO TABS
25.0000 mg | ORAL_TABLET | Freq: Every morning | ORAL | Status: DC
  Administered 2021-07-22: 25 mg via ORAL
  Filled 2021-07-21: qty 1

## 2021-07-21 MED ORDER — POLYVINYL ALCOHOL 1.4 % OP SOLN: 1.0000 [drp] | Freq: Every day | OPHTHALMIC | Status: DC | PRN

## 2021-07-21 MED ORDER — INSULIN ASPART 100 UNIT/ML IJ SOLN
6.0000 [IU] | Freq: Three times a day (TID) | INTRAMUSCULAR | Status: DC
  Administered 2021-07-22 (×2): 6 [IU] via SUBCUTANEOUS

## 2021-07-21 MED ORDER — AMLODIPINE BESYLATE 10 MG PO TABS
10.0000 mg | ORAL_TABLET | Freq: Every morning | ORAL | Status: DC
  Administered 2021-07-22: 10 mg via ORAL
  Filled 2021-07-21: qty 1

## 2021-07-21 MED ORDER — TRANEXAMIC ACID-NACL 1000-0.7 MG/100ML-% IV SOLN
1000.0000 mg | INTRAVENOUS | Status: AC
  Administered 2021-07-21: 1000 mg via INTRAVENOUS
  Filled 2021-07-21: qty 100

## 2021-07-21 MED ORDER — KETOCONAZOLE 2 % EX CREA
1.0000 "application " | TOPICAL_CREAM | Freq: Every day | CUTANEOUS | Status: DC
  Administered 2021-07-22: 1 via TOPICAL
  Filled 2021-07-21: qty 15

## 2021-07-21 MED ORDER — ACETAMINOPHEN 500 MG PO TABS
1000.0000 mg | ORAL_TABLET | Freq: Once | ORAL | Status: AC
  Administered 2021-07-21: 1000 mg via ORAL
  Filled 2021-07-21: qty 2

## 2021-07-21 MED ORDER — PHENOL 1.4 % MT LIQD: 1.0000 | OROMUCOSAL | Status: DC | PRN

## 2021-07-21 MED ORDER — LACTATED RINGERS IV SOLN: INTRAVENOUS | Status: DC

## 2021-07-21 MED ORDER — LIDOCAINE 2% (20 MG/ML) 5 ML SYRINGE
INTRAMUSCULAR | Status: DC | PRN
  Administered 2021-07-21: 40 mg via INTRAVENOUS

## 2021-07-21 MED ORDER — HYDROCODONE-ACETAMINOPHEN 7.5-325 MG PO TABS: 1.0000 | ORAL_TABLET | ORAL | Status: DC | PRN

## 2021-07-21 MED ORDER — CYCLOSPORINE 0.05 % OP EMUL
1.0000 [drp] | Freq: Two times a day (BID) | OPHTHALMIC | Status: DC
  Administered 2021-07-21 – 2021-07-22 (×2): 1 [drp] via OPHTHALMIC
  Filled 2021-07-21 (×3): qty 30

## 2021-07-21 MED ORDER — INSULIN ASPART 100 UNIT/ML IJ SOLN
0.0000 [IU] | Freq: Three times a day (TID) | INTRAMUSCULAR | Status: DC
  Administered 2021-07-22: 15 [IU] via SUBCUTANEOUS

## 2021-07-21 MED ORDER — SODIUM CHLORIDE 0.9 % IV SOLN: INTRAVENOUS | Status: DC

## 2021-07-21 MED ORDER — ROCURONIUM BROMIDE 10 MG/ML (PF) SYRINGE
PREFILLED_SYRINGE | INTRAVENOUS | Status: DC | PRN
  Administered 2021-07-21: 40 mg via INTRAVENOUS

## 2021-07-21 MED ORDER — ONDANSETRON HCL 4 MG/2ML IJ SOLN
INTRAMUSCULAR | Status: DC | PRN
  Administered 2021-07-21: 4 mg via INTRAVENOUS

## 2021-07-21 MED ORDER — AMISULPRIDE (ANTIEMETIC) 5 MG/2ML IV SOLN: 10.0000 mg | Freq: Once | INTRAVENOUS | Status: DC | PRN

## 2021-07-21 MED ORDER — FENTANYL CITRATE (PF) 250 MCG/5ML IJ SOLN
INTRAMUSCULAR | Status: AC
  Filled 2021-07-21: qty 5

## 2021-07-21 MED ORDER — INSULIN ASPART 100 UNIT/ML IJ SOLN
0.0000 [IU] | Freq: Every day | INTRAMUSCULAR | Status: DC
  Administered 2021-07-21: 2 [IU] via SUBCUTANEOUS

## 2021-07-21 MED ORDER — SUGAMMADEX SODIUM 200 MG/2ML IV SOLN
INTRAVENOUS | Status: DC | PRN
  Administered 2021-07-21: 200 mg via INTRAVENOUS

## 2021-07-21 MED ORDER — BISACODYL 10 MG RE SUPP: 10.0000 mg | Freq: Every day | RECTAL | Status: DC | PRN

## 2021-07-21 MED ORDER — ONDANSETRON HCL 4 MG PO TABS: 4.0000 mg | ORAL_TABLET | Freq: Four times a day (QID) | ORAL | Status: DC | PRN

## 2021-07-21 MED ORDER — FENTANYL CITRATE PF 50 MCG/ML IJ SOSY: 50.0000 ug | PREFILLED_SYRINGE | Freq: Once | INTRAMUSCULAR | Status: AC

## 2021-07-21 MED ORDER — METOCLOPRAMIDE HCL 5 MG/ML IJ SOLN: 5.0000 mg | Freq: Three times a day (TID) | INTRAMUSCULAR | Status: DC | PRN

## 2021-07-21 MED ORDER — CEFAZOLIN SODIUM-DEXTROSE 2-4 GM/100ML-% IV SOLN
2.0000 g | INTRAVENOUS | Status: AC
  Administered 2021-07-21: 2 g via INTRAVENOUS
  Filled 2021-07-21: qty 100

## 2021-07-21 MED ORDER — DEXAMETHASONE SODIUM PHOSPHATE 10 MG/ML IJ SOLN
INTRAMUSCULAR | Status: DC | PRN
  Administered 2021-07-21: 10 mg via INTRAVENOUS

## 2021-07-21 MED ORDER — ACETAMINOPHEN 325 MG PO TABS: 325.0000 mg | ORAL_TABLET | Freq: Four times a day (QID) | ORAL | Status: DC | PRN

## 2021-07-21 MED ORDER — CHLORHEXIDINE GLUCONATE 0.12 % MT SOLN
15.0000 mL | Freq: Once | OROMUCOSAL | Status: AC
  Administered 2021-07-21: 15 mL via OROMUCOSAL
  Filled 2021-07-21: qty 15

## 2021-07-21 MED ORDER — FENTANYL CITRATE (PF) 100 MCG/2ML IJ SOLN
25.0000 ug | INTRAMUSCULAR | Status: DC | PRN
  Administered 2021-07-21: 50 ug via INTRAVENOUS

## 2021-07-21 MED ORDER — BUPIVACAINE-EPINEPHRINE 0.25% -1:200000 IJ SOLN
INTRAMUSCULAR | Status: DC | PRN
  Administered 2021-07-21: 9 mL

## 2021-07-21 MED ORDER — METHOCARBAMOL 500 MG PO TABS
500.0000 mg | ORAL_TABLET | Freq: Four times a day (QID) | ORAL | Status: DC | PRN
  Administered 2021-07-22 (×2): 500 mg via ORAL
  Filled 2021-07-21 (×2): qty 1

## 2021-07-21 MED ORDER — PROMETHAZINE HCL 25 MG/ML IJ SOLN: 6.2500 mg | INTRAMUSCULAR | Status: DC | PRN

## 2021-07-21 MED ORDER — DOCUSATE SODIUM 100 MG PO CAPS
100.0000 mg | ORAL_CAPSULE | Freq: Two times a day (BID) | ORAL | Status: DC
  Administered 2021-07-22: 100 mg via ORAL
  Filled 2021-07-21: qty 1

## 2021-07-21 MED ORDER — MORPHINE SULFATE (PF) 2 MG/ML IV SOLN
0.5000 mg | INTRAVENOUS | Status: DC | PRN
  Administered 2021-07-22: 1 mg via INTRAVENOUS
  Filled 2021-07-21: qty 1

## 2021-07-21 SURGICAL SUPPLY — 74 items
AID PSTN UNV HD RSTRNT DISP (MISCELLANEOUS) ×1
BAG COUNTER SPONGE SURGICOUNT (BAG) ×2 IMPLANT
BAG SPNG CNTER NS LX DISP (BAG) ×1
BIT DRILL 170X2.5X (BIT) IMPLANT
BIT DRILL 5/64X5 DISP (BIT) ×2 IMPLANT
BIT DRL 170X2.5X (BIT) ×1
BLADE SAG 18X100X1.27 (BLADE) ×2 IMPLANT
COVER SURGICAL LIGHT HANDLE (MISCELLANEOUS) ×2 IMPLANT
DRAPE INCISE IOBAN 66X45 STRL (DRAPES) ×2 IMPLANT
DRAPE ORTHO SPLIT 77X108 STRL (DRAPES) ×4
DRAPE SURG ORHT 6 SPLT 77X108 (DRAPES) ×2 IMPLANT
DRAPE U-SHAPE 47X51 STRL (DRAPES) ×2 IMPLANT
DRILL 2.5 (BIT) ×2
DRSG ADAPTIC 3X8 NADH LF (GAUZE/BANDAGES/DRESSINGS) ×2 IMPLANT
DRSG PAD ABDOMINAL 8X10 ST (GAUZE/BANDAGES/DRESSINGS) ×3 IMPLANT
DURAPREP 26ML APPLICATOR (WOUND CARE) ×2 IMPLANT
ELECT BLADE 4.0 EZ CLEAN MEGAD (MISCELLANEOUS) ×2
ELECT NDL TIP 2.8 STRL (NEEDLE) ×1 IMPLANT
ELECT NEEDLE TIP 2.8 STRL (NEEDLE) ×2 IMPLANT
ELECT REM PT RETURN 9FT ADLT (ELECTROSURGICAL) ×2
ELECTRODE BLDE 4.0 EZ CLN MEGD (MISCELLANEOUS) ×1 IMPLANT
ELECTRODE REM PT RTRN 9FT ADLT (ELECTROSURGICAL) ×1 IMPLANT
GAUZE SPONGE 4X4 12PLY STRL (GAUZE/BANDAGES/DRESSINGS) ×2 IMPLANT
GLENOSPHERE DELTA XTEND LAT 38 (Miscellaneous) ×1 IMPLANT
GLOVE SURG ORTHO LTX SZ7.5 (GLOVE) ×2 IMPLANT
GLOVE SURG ORTHO LTX SZ8.5 (GLOVE) ×2 IMPLANT
GLOVE SURG POLY ORTHO LF SZ7.5 (GLOVE) ×2 IMPLANT
GLOVE SURG POLY ORTHO LF SZ8 (GLOVE) ×2 IMPLANT
GOWN STRL REUS W/ TWL LRG LVL3 (GOWN DISPOSABLE) ×1 IMPLANT
GOWN STRL REUS W/ TWL XL LVL3 (GOWN DISPOSABLE) ×2 IMPLANT
GOWN STRL REUS W/TWL LRG LVL3 (GOWN DISPOSABLE) ×2
GOWN STRL REUS W/TWL XL LVL3 (GOWN DISPOSABLE) ×6
KIT BASIN OR (CUSTOM PROCEDURE TRAY) ×2 IMPLANT
KIT TURNOVER KIT B (KITS) ×2 IMPLANT
MANIFOLD NEPTUNE II (INSTRUMENTS) ×2 IMPLANT
METAGLENE DELTA EXTEND (Trauma) IMPLANT
METAGLENE DXTEND (Trauma) ×2 IMPLANT
NDL 1/2 CIR MAYO (NEEDLE) ×1 IMPLANT
NDL HYPO 25GX1X1/2 BEV (NEEDLE) ×1 IMPLANT
NEEDLE 1/2 CIR MAYO (NEEDLE) ×2 IMPLANT
NEEDLE HYPO 25GX1X1/2 BEV (NEEDLE) ×2 IMPLANT
NS IRRIG 1000ML POUR BTL (IV SOLUTION) ×2 IMPLANT
PACK SHOULDER (CUSTOM PROCEDURE TRAY) ×2 IMPLANT
PAD ARMBOARD 7.5X6 YLW CONV (MISCELLANEOUS) ×4 IMPLANT
PIN GUIDE 1.2 (PIN) ×1 IMPLANT
PIN GUIDE GLENOPHERE 1.5MX300M (PIN) ×1 IMPLANT
PIN METAGLENE 2.5 (PIN) ×2 IMPLANT
RESTRAINT HEAD UNIVERSAL NS (MISCELLANEOUS) ×2 IMPLANT
SCREW 4.5X36MM (Screw) ×1 IMPLANT
SCREW LOCK 42 (Screw) ×1 IMPLANT
SLING ARM FOAM STRAP LRG (SOFTGOODS) ×1 IMPLANT
SPACER 38 PLUS 3 (Spacer) ×1 IMPLANT
SPONGE T-LAP 18X18 ~~LOC~~+RFID (SPONGE) IMPLANT
SPONGE T-LAP 4X18 ~~LOC~~+RFID (SPONGE) ×3 IMPLANT
STAPLER VISISTAT 35W (STAPLE) ×1 IMPLANT
STEM EPIPHYSIS SHOULDER (Stem) ×1 IMPLANT
STEM STANDARD SZ 10 113MM (Stem) ×2 IMPLANT
STEM STD SZ 10 113MM (Stem) IMPLANT
STRIP CLOSURE SKIN 1/2X4 (GAUZE/BANDAGES/DRESSINGS) ×1 IMPLANT
SUCTION FRAZIER HANDLE 10FR (MISCELLANEOUS) ×2
SUCTION TUBE FRAZIER 10FR DISP (MISCELLANEOUS) ×1 IMPLANT
SUT FIBERWIRE #2 38 T-5 BLUE (SUTURE) ×10
SUT MNCRL AB 4-0 PS2 18 (SUTURE) ×2 IMPLANT
SUT VIC AB 0 CT2 27 (SUTURE) ×2 IMPLANT
SUT VIC AB 2-0 CT1 27 (SUTURE) ×4
SUT VIC AB 2-0 CT1 TAPERPNT 27 (SUTURE) ×1 IMPLANT
SUT VICRYL 0 CT 1 36IN (SUTURE) ×2 IMPLANT
SUTURE FIBERWR #2 38 T-5 BLUE (SUTURE) IMPLANT
SYR CONTROL 10ML LL (SYRINGE) ×2 IMPLANT
TAPE CLOTH SURG 6X10 WHT LF (GAUZE/BANDAGES/DRESSINGS) ×1 IMPLANT
TOWEL GREEN STERILE (TOWEL DISPOSABLE) ×2 IMPLANT
TOWEL GREEN STERILE FF (TOWEL DISPOSABLE) ×2 IMPLANT
TOWER CARTRIDGE SMART MIX (DISPOSABLE) IMPLANT
YANKAUER SUCT BULB TIP NO VENT (SUCTIONS) ×2 IMPLANT

## 2021-07-21 NOTE — Transfer of Care (Signed)
Immediate Anesthesia Transfer of Care Note  Patient: Alexis Hopkins  Procedure(s) Performed: REVERSE SHOULDER ARTHROPLASTY (Left: Shoulder)  Patient Location: PACU  Anesthesia Type:GA combined with regional for post-op pain  Level of Consciousness: awake, alert  and oriented  Airway & Oxygen Therapy: Patient connected to nasal cannula oxygen  Post-op Assessment: Report given to RN and Post -op Vital signs reviewed and stable  Post vital signs: Reviewed and stable  Last Vitals:  Vitals Value Taken Time  BP 154/77 07/21/21 2105  Temp    Pulse 105 07/21/21 2107  Resp 19 07/21/21 2107  SpO2 94 % 07/21/21 2107  Vitals shown include unvalidated device data.  Last Pain:  Vitals:   07/21/21 1720  TempSrc:   PainSc: 0-No pain      Patients Stated Pain Goal: 3 (70/14/10 3013)  Complications: No notable events documented.

## 2021-07-21 NOTE — Brief Op Note (Signed)
07/21/2021  8:59 PM  PATIENT:  Alexis Hopkins  75 y.o. female  PRE-OPERATIVE DIAGNOSIS:  comminuted and displaced left proximal humerus fracture  POST-OPERATIVE DIAGNOSIS:  comminuted and displaced left proximal humerus fracture, displaced glenoid fracture  PROCEDURE:  Procedure(s): REVERSE SHOULDER ARTHROPLASTY (Left) DePuy Delta Xtend with tuberosity repair  SURGEON:  Surgeon(s) and Role:    Netta Cedars, MD - Primary  PHYSICIAN ASSISTANT:   ASSISTANTS: Ventura Bruns, PA-C   ANESTHESIA:   regional and general  EBL:  200 mL   BLOOD ADMINISTERED:none  DRAINS: none   LOCAL MEDICATIONS USED:  MARCAINE     SPECIMEN:  No Specimen  DISPOSITION OF SPECIMEN:  N/A  COUNTS:  YES  TOURNIQUET:  * No tourniquets in log *  DICTATION: .Other Dictation: Dictation Number [id number]  PLAN OF CARE: Admit for overnight observation  PATIENT DISPOSITION:  PACU - hemodynamically stable.   Delay start of Pharmacological VTE agent (>24hrs) due to surgical blood loss or risk of bleeding: not applicable

## 2021-07-21 NOTE — Anesthesia Preprocedure Evaluation (Addendum)
Anesthesia Evaluation  Patient identified by MRN, date of birth, ID band Patient awake    Reviewed: Allergy & Precautions, NPO status , Patient's Chart, lab work & pertinent test results  History of Anesthesia Complications (+) PONV and history of anesthetic complications  Airway Mallampati: II  TM Distance: >3 FB Neck ROM: Full    Dental no notable dental hx. (+) Dental Advisory Given,    Pulmonary neg pulmonary ROS,    Pulmonary exam normal        Cardiovascular hypertension, Pt. on medications Normal cardiovascular exam     Neuro/Psych TIA   GI/Hepatic negative GI ROS, Neg liver ROS,   Endo/Other  diabetes  Renal/GU negative Renal ROS     Musculoskeletal negative musculoskeletal ROS (+)   Abdominal   Peds  Hematology negative hematology ROS (+)   Anesthesia Other Findings   Reproductive/Obstetrics                           Anesthesia Physical Anesthesia Plan  ASA: 3  Anesthesia Plan: General   Post-op Pain Management:  Regional for Post-op pain   Induction: Intravenous  PONV Risk Score and Plan: 4 or greater and Ondansetron and Dexamethasone  Airway Management Planned: Oral ETT  Additional Equipment:   Intra-op Plan:   Post-operative Plan: Extubation in OR  Informed Consent: I have reviewed the patients History and Physical, chart, labs and discussed the procedure including the risks, benefits and alternatives for the proposed anesthesia with the patient or authorized representative who has indicated his/her understanding and acceptance.     Dental advisory given  Plan Discussed with: Anesthesiologist  Anesthesia Plan Comments:        Anesthesia Quick Evaluation

## 2021-07-21 NOTE — OR Nursing (Signed)
Care of patient assumed at 30.

## 2021-07-21 NOTE — Anesthesia Procedure Notes (Signed)
Anesthesia Regional Block: Interscalene brachial plexus block   Pre-Anesthetic Checklist: , timeout performed,  Correct Patient, Correct Site, Correct Laterality,  Correct Procedure, Correct Position, site marked,  Risks and benefits discussed,  Surgical consent,  Pre-op evaluation,  At surgeon's request and post-op pain management  Laterality: Left  Prep: chloraprep        Procedures: Doppler guided,,,, ultrasound used (permanent image in chart),,     Nerve Stimulator or Paresthesia:  Response: 0.5 mA  Additional Responses:   Narrative:  Start time: 07/21/2021 5:00 PM End time: 07/21/2021 5:20 PM Injection made incrementally with aspirations every 5 mL.  Performed by: Personally  Anesthesiologist: Belinda Block, MD

## 2021-07-21 NOTE — Discharge Instructions (Signed)
Ice to the shoulder constantly.  Keep the incision covered and clean and dry for one week, then ok to get it wet in the shower.  Do gentle exercise as instructed several times per day.  DO NOT reach behind your back or push up out of a chair with the operative arm.  Use a sling while you are up and around for comfort, may remove while seated.  Keep pillow propped behind the operative elbow.  Follow up with Dr Veverly Fells in two weeks in the office, call [phone] for appt

## 2021-07-21 NOTE — Anesthesia Procedure Notes (Signed)
Procedure Name: Intubation Date/Time: 07/21/2021 7:06 PM Performed by: Valetta Fuller, CRNA Pre-anesthesia Checklist: Patient identified, Emergency Drugs available, Suction available and Patient being monitored Patient Re-evaluated:Patient Re-evaluated prior to induction Oxygen Delivery Method: Circle system utilized Preoxygenation: Pre-oxygenation with 100% oxygen Induction Type: IV induction Ventilation: Mask ventilation without difficulty Laryngoscope Size: Miller and 2 Grade View: Grade I Tube type: Oral Tube size: 7.0 mm Number of attempts: 1 Airway Equipment and Method: Stylet Placement Confirmation: ETT inserted through vocal cords under direct vision, positive ETCO2 and breath sounds checked- equal and bilateral Secured at: 22 cm Tube secured with: Tape Dental Injury: Teeth and Oropharynx as per pre-operative assessment

## 2021-07-21 NOTE — Progress Notes (Signed)
PHARMACIST - PHYSICIAN ORDER COMMUNICATION  CONCERNING: P&T Medication Policy on Herbal Medications  DESCRIPTION:  This patient's order for:  Osteo Bi-flex and Co Q 10  has been noted.  This product(s) is classified as an "herbal" or natural product. Due to a lack of definitive safety studies or FDA approval, nonstandard manufacturing practices, plus the potential risk of unknown drug-drug interactions while on inpatient medications, the Pharmacy and Therapeutics Committee does not permit the use of "herbal" or natural products of this type within [facility].   ACTION TAKEN: The pharmacy department is unable to verify this order at this time and your patient has been informed of this safety policy. Please reevaluate patient's clinical condition at discharge and address if the herbal or natural product(s) should be resumed at that time.

## 2021-07-22 DIAGNOSIS — Z96653 Presence of artificial knee joint, bilateral: Secondary | ICD-10-CM | POA: Diagnosis not present

## 2021-07-22 DIAGNOSIS — Z20822 Contact with and (suspected) exposure to covid-19: Secondary | ICD-10-CM | POA: Diagnosis not present

## 2021-07-22 DIAGNOSIS — E119 Type 2 diabetes mellitus without complications: Secondary | ICD-10-CM | POA: Diagnosis not present

## 2021-07-22 DIAGNOSIS — Z7902 Long term (current) use of antithrombotics/antiplatelets: Secondary | ICD-10-CM | POA: Diagnosis not present

## 2021-07-22 DIAGNOSIS — Z85828 Personal history of other malignant neoplasm of skin: Secondary | ICD-10-CM | POA: Diagnosis not present

## 2021-07-22 DIAGNOSIS — Z96651 Presence of right artificial knee joint: Secondary | ICD-10-CM | POA: Diagnosis not present

## 2021-07-22 DIAGNOSIS — I10 Essential (primary) hypertension: Secondary | ICD-10-CM | POA: Diagnosis not present

## 2021-07-22 DIAGNOSIS — S42292A Other displaced fracture of upper end of left humerus, initial encounter for closed fracture: Secondary | ICD-10-CM | POA: Diagnosis not present

## 2021-07-22 DIAGNOSIS — Z7984 Long term (current) use of oral hypoglycemic drugs: Secondary | ICD-10-CM | POA: Diagnosis not present

## 2021-07-22 DIAGNOSIS — Z96611 Presence of right artificial shoulder joint: Secondary | ICD-10-CM | POA: Diagnosis not present

## 2021-07-22 DIAGNOSIS — Z96612 Presence of left artificial shoulder joint: Secondary | ICD-10-CM | POA: Diagnosis not present

## 2021-07-22 DIAGNOSIS — Z7982 Long term (current) use of aspirin: Secondary | ICD-10-CM | POA: Diagnosis not present

## 2021-07-22 DIAGNOSIS — Z79899 Other long term (current) drug therapy: Secondary | ICD-10-CM | POA: Diagnosis not present

## 2021-07-22 DIAGNOSIS — Z8673 Personal history of transient ischemic attack (TIA), and cerebral infarction without residual deficits: Secondary | ICD-10-CM | POA: Diagnosis not present

## 2021-07-22 DIAGNOSIS — Z96652 Presence of left artificial knee joint: Secondary | ICD-10-CM | POA: Diagnosis not present

## 2021-07-22 LAB — HEMOGLOBIN A1C
Hgb A1c MFr Bld: 6.8 % — ABNORMAL HIGH (ref 4.8–5.6)
Mean Plasma Glucose: 148.46 mg/dL

## 2021-07-22 LAB — GLUCOSE, CAPILLARY
Glucose-Capillary: 348 mg/dL — ABNORMAL HIGH (ref 70–99)
Glucose-Capillary: 257 mg/dL — ABNORMAL HIGH (ref 70–99)

## 2021-07-22 NOTE — Discharge Summary (Signed)
In most cases prophylactic antibiotics for Dental procdeures after total joint surgery are not necessary.  Exceptions are as follows:  1. History of prior total joint infection  2. Severely immunocompromised (Organ Transplant, cancer chemotherapy, Rheumatoid biologic meds such as [place])  3. Poorly controlled diabetes (A1C &gt; 8.0, blood glucose over 200)  If you have one of these conditions, contact your surgeon for an antibiotic prescription, prior to your dental procedure. Orthopedic Discharge Summary        Physician Discharge Summary  Patient ID: Alexis Hopkins MRN: [id number] DOB/AGE: 06/26/1946 75 y.o.  Admit date: 07/21/2021 Discharge date: 07/22/2021   Procedures:  Procedure(s) (LRB): REVERSE SHOULDER ARTHROPLASTY (Left)  Attending Physician:  Dr. Esmond Plants  Admission Diagnoses:   left shoulder fracture  Discharge Diagnoses:  same   Past Medical History:  Diagnosis Date   Anemia    Arthritis    Basal cell carcinoma 01/08/2014   BCC NODULAR NOSE MOHS   Cancer ([place])    SKIN CANCERS REMOVED   Diabetes mellitus without complication ([place])    Type II   Family history of adverse reaction to anesthesia    "mother always was deathly sick"   History of kidney stones 26 YRS AGO   Hx of skin cancer, basal cell    Hyperlipemia    Hypertension    Osteoporosis    PONV (postoperative nausea and vomiting)    RSD (reflex sympathetic dystrophy)    L ARM   Squamous cell carcinoma of skin    ABOVE LEFT BROW CX3 5FU   TIA (transient ischemic attack) 1994   Takes Plavix    PCP: Mayra Neer, MD   Discharged Condition: good  Hospital Course:  Patient underwent the above stated procedure on 07/21/2021. Patient tolerated the procedure well and brought to the recovery room in good condition and subsequently to the floor. Patient had an uncomplicated hospital course and was stable for discharge.   Disposition: Discharge disposition: 01-Home or Self  Care      with follow up in 2 weeks    Follow-up Information     Netta Cedars, MD. Call in 2 week(s).   Specialty: Orthopedic Surgery Why: call [phone] for appt Contact information: [address] [place] [address] [phone]                 Dental Antibiotics:  In most cases prophylactic antibiotics for Dental procdeures after total joint surgery are not necessary.  Exceptions are as follows:  1. History of prior total joint infection  2. Severely immunocompromised (Organ Transplant, cancer chemotherapy, Rheumatoid biologic meds such as [place])  3. Poorly controlled diabetes (A1C &gt; 8.0, blood glucose over 200)  If you have one of these conditions, contact your surgeon for an antibiotic prescription, prior to your dental procedure.  Discharge Instructions     Call MD / Call 911   Complete by: As directed    If you experience chest pain or shortness of breath, CALL 911 and be transported to the hospital emergency room.  If you develope a fever above 101 F, pus (white drainage) or increased drainage or redness at the wound, or calf pain, call your surgeon's office.   Constipation Prevention   Complete by: As directed    Drink plenty of fluids.  Prune juice may be helpful.  You may use a stool softener, such as Colace (over the counter) 100 mg twice a day.  Use MiraLax (over the counter) for  constipation as needed.   Diet - low sodium heart healthy   Complete by: As directed    Increase activity slowly as tolerated   Complete by: As directed    Post-operative opioid taper instructions:   Complete by: As directed    POST-OPERATIVE OPIOID TAPER INSTRUCTIONS: It is important to wean off of your opioid medication as soon as possible. If you do not need pain medication after your surgery it is ok to stop day one. Opioids include: Codeine, Hydrocodone(Norco, Vicodin), Oxycodone(Percocet, oxycontin) and hydromorphone amongst others.   Long term and even short term use of opiods can cause: Increased pain response Dependence Constipation Depression Respiratory depression And more.  Withdrawal symptoms can include Flu like symptoms Nausea, vomiting And more Techniques to manage these symptoms Hydrate well Eat regular healthy meals Stay active Use relaxation techniques(deep breathing, meditating, yoga) Do Not substitute Alcohol to help with tapering If you have been on opioids for less than two weeks and do not have pain than it is ok to stop all together.  Plan to wean off of opioids This plan should start within one week post op of your joint replacement. Maintain the same interval or time between taking each dose and first decrease the dose.  Cut the total daily intake of opioids by one tablet each day Next start to increase the time between doses. The last dose that should be eliminated is the evening dose.          Allergies as of 07/22/2021       Reactions   Motrin [ibuprofen] Other (See Comments)   Per MD-- does not want her taking.    Shellfish Allergy Hives   Shrimp.        Medication List     STOP taking these medications    Omega 3 1000 MG Caps       TAKE these medications    amLODipine 10 MG tablet Commonly known as: NORVASC Take 10 mg by mouth every morning.   amoxicillin 500 MG capsule Commonly known as: AMOXIL Take 2,000 mg by mouth See admin instructions. Take 4 capsules (2000 mg) by mouth 1 hour prior to dental appointments   aspirin 325 MG tablet Take 325 mg by mouth every morning.   b complex vitamins capsule Take 1 capsule by mouth daily.   calcium-vitamin D 500-200 MG-UNIT tablet Commonly known as: OSCAL WITH D Take 1 tablet by mouth every morning.   clopidogrel 75 MG tablet Commonly known as: PLAVIX Take 75 mg by mouth daily with breakfast.   clotrimazole 1 % cream Commonly known as: LOTRIMIN Apply 1 application topically 2 (two) times daily.   Co Q  10 100 MG Caps Take 100 mg by mouth daily.   colesevelam 625 MG tablet Commonly known as: WELCHOL Take 1,875 mg by mouth 2 (two) times daily with a meal.   cycloSPORINE 0.05 % ophthalmic emulsion Commonly known as: RESTASIS 1 drop 2 (two) times daily.   glimepiride 2 MG tablet Commonly known as: AMARYL Take 2 mg by mouth 2 (two) times daily.   hydrochlorothiazide 25 MG tablet Commonly known as: HYDRODIURIL Take 25 mg by mouth every morning.   HYDROcodone-acetaminophen 7.5-325 MG tablet Commonly known as: NORCO Take 1 tablet by mouth every 4 (four) hours as needed for moderate pain. What changed:  when to take this reasons to take this   ketoconazole 2 % cream Commonly known as: NIZORAL Apply 1 application topically daily.   lisinopril 40 MG  tablet Commonly known as: ZESTRIL Take 40 mg by mouth every morning.   metFORMIN 1000 MG tablet Commonly known as: GLUCOPHAGE Take 1,000 mg by mouth 2 (two) times daily with a meal.   multivitamin with minerals Tabs tablet Take 1 tablet by mouth every morning.   OSTEO BI-FLEX TRIPLE STRENGTH PO Take 1 tablet by mouth 2 (two) times daily.   OVER THE COUNTER MEDICATION Place 1 application into both eyes at bedtime. Pure and gentle lubricant eye pad   polyethylene glycol 17 g packet Commonly known as: MIRALAX / GLYCOLAX Take 17 g by mouth 2 (two) times daily. What changed:  when to take this reasons to take this   pravastatin 40 MG tablet Commonly known as: PRAVACHOL Take 40 mg by mouth daily.   Refresh 1.4-0.6 % Soln Generic drug: Polyvinyl Alcohol-Povidone PF Place 1 drop into both eyes daily as needed (Dry eye).   sodium chloride 0.65 % Soln nasal spray Commonly known as: OCEAN Place 1-2 sprays into both nostrils daily as needed (Dry nose).          Signed: LATERRIA LASOTA 07/22/2021, 7:05 AM  [facility] is now [facility] [address] [place], [place], [place]  [address] Phone: [place]

## 2021-07-22 NOTE — Evaluation (Signed)
Occupational Therapy Evaluation Patient Details Name: Alexis Hopkins MRN: [id number] DOB: 1945-11-01 Today's Date: 07/22/2021   History of Present Illness 75 yo female s/p L reverse shoulder replacement on 10/20 after L proximal humerus fx after fall. PMH including arthritis, DM, HTN, TIA, bilateral TKA, R reverse total shoulder (2015).   Clinical Impression   PTA, pt was living with her husband and was independent. Currently, pt requires Min A for UB ADLs, Min Guard-Min A for LB ADLs, and Supervision-Min guard A for functional mobility. Provided education and handout on shoulder precautions, exercises, sleep positioning, sling management, UB ADLs, LB ADLs, toilet transfer, and tub transfer with shower seat; pt demonstrated understanding. Answered all pt questions. Recommend dc home once medically stable per physician. All acute OT needs met and will sign off. Thank you.      Recommendations for follow up therapy are one component of a multi-disciplinary discharge planning process, led by the attending physician.  Recommendations may be updated based on patient status, additional functional criteria and insurance authorization.   Follow Up Recommendations  Follow surgeon's recommendation for DC plan and follow-up therapies    Equipment Recommendations  3 in 1 bedside commode    Recommendations for Other Services       Precautions / Restrictions Precautions Precautions: Shoulder Type of Shoulder Precautions: Conservative protocal. No ROM of shoulder. Cleared for hand, wrist, and elbow. Shoulder Interventions: Shoulder sling/immobilizer Precaution Booklet Issued: Yes (comment) Precaution Comments: Providing handout and reviewing in full. Required Braces or Orthoses: Sling Restrictions Weight Bearing Restrictions: Yes LUE Weight Bearing: Non weight bearing Other Position/Activity Restrictions: No shoulder ROM      Mobility Bed Mobility Overal bed mobility: Needs Assistance Bed  Mobility: Supine to Sit;Sit to Supine     Supine to sit: Supervision;HOB elevated Sit to supine: Supervision;HOB elevated        Transfers Overall transfer level: Needs assistance Equipment used: None Transfers: Sit to/from Stand Sit to Stand: Supervision         General transfer comment: Supervision for safety    Balance Overall balance assessment: No apparent balance deficits (not formally assessed)                                         ADL either performed or assessed with clinical judgement   ADL Overall ADL's : Needs assistance/impaired Eating/Feeding: Set up;Sitting   Grooming: Set up;Sitting;Wash/dry hands Grooming Details (indicate cue type and reason): hand sanitizer Upper Body Bathing: Supervision/ safety;Set up;Sitting Upper Body Bathing Details (indicate cue type and reason): Educating pt on compensatory techniques for UB bathing techniques Lower Body Bathing: Min guard;Sit to/from stand   Upper Body Dressing : Minimal assistance;Sitting Upper Body Dressing Details (indicate cue type and reason): Pt requiring Min A for positioning of sleeve and sling. Lower Body Dressing: Minimal assistance;Sit to/from stand Lower Body Dressing Details (indicate cue type and reason): Min A for pulling pants over hips Toilet Transfer: Supervision/safety;Ambulation;Regular Glass blower/designer Details (indicate cue type and reason): Supervision for safety     Tub/ Shower Transfer: Min guard;Tub transfer;Ambulation;Shower seat   Functional mobility during ADLs: Min guard General ADL Comments: Providing education on shoulder precautions, exercises, sling ,maangement, sleep positioning, UB ADLs, LB ADLs, and tub transfer     Vision Patient Visual Report: No change from baseline       Perception     Praxis  Pertinent Vitals/Pain Pain Assessment: Faces Faces Pain Scale: Hurts a little bit Pain Location: LUE Pain Descriptors / Indicators:  Discomfort;Grimacing Pain Intervention(s): Monitored during session;Limited activity within patient's tolerance;Repositioned     Hand Dominance Right   Extremity/Trunk Assessment Upper Extremity Assessment Upper Extremity Assessment: LUE deficits/detail LUE Deficits / Details: S/p total shoulder LUE Coordination: decreased fine motor;decreased gross motor   Lower Extremity Assessment Lower Extremity Assessment: Defer to PT evaluation   Cervical / Trunk Assessment Cervical / Trunk Assessment: Kyphotic   Communication Communication Communication: No difficulties   Cognition Arousal/Alertness: Awake/alert Behavior During Therapy: WFL for tasks assessed/performed Overall Cognitive Status: Within Functional Limits for tasks assessed                                     General Comments  Husband and daughter present throughout    Exercises Exercises: Shoulder Shoulder Exercises Elbow Flexion: AAROM;Left;10 reps;Seated Elbow Extension: AAROM;Left;10 reps;Seated Wrist Flexion: AROM;Left;10 reps;Seated Wrist Extension: AROM;Left;10 reps;Seated Digit Composite Flexion: AROM;Left;10 reps;Seated Composite Extension: AROM;Left;10 reps;Seated   Shoulder Instructions Shoulder Instructions Donning/doffing shirt without moving shoulder: Minimal assistance Method for sponge bathing under operated UE: Min-guard Donning/doffing sling/immobilizer: Minimal assistance Correct positioning of sling/immobilizer: Min-guard ROM for elbow, wrist and digits of operated UE: Supervision/safety Sling wearing schedule (on at all times/off for ADL's): Supervision/safety Proper positioning of operated UE when showering: Supervision/safety Positioning of UE while sleeping: Minimal assistance    Home Living Family/patient expects to be discharged to:: Private residence Living Arrangements: Spouse/significant other;Other relatives;Parent Available Help at Discharge: Family Type of Home:  House Home Access: Ramped entrance     Home Layout: One level     Bathroom Shower/Tub: Teacher, early years/pre: Standard     Home Equipment: Shower seat          Prior Functioning/Environment Level of Independence: Independent                 OT Problem List: Decreased range of motion;Decreased knowledge of use of DME or AE;Decreased knowledge of precautions;Pain      OT Treatment/Interventions:      OT Goals(Current goals can be found in the care plan section) Acute Rehab OT Goals Patient Stated Goal: Go home OT Goal Formulation: All assessment and education complete, DC therapy  OT Frequency:     Barriers to D/C:            Co-evaluation              AM-PAC OT "6 Clicks" Daily Activity     Outcome Measure Help from another person eating meals?: None Help from another person taking care of personal grooming?: None Help from another person toileting, which includes using toliet, bedpan, or urinal?: A Little Help from another person bathing (including washing, rinsing, drying)?: A Little Help from another person to put on and taking off regular upper body clothing?: A Little Help from another person to put on and taking off regular lower body clothing?: A Little 6 Click Score: 20   End of Session Nurse Communication: Mobility status  Activity Tolerance: Patient tolerated treatment well Patient left: in bed;with call bell/phone within reach  OT Visit Diagnosis: Muscle weakness (generalized) (M62.81);Pain Pain - Right/Left: Left Pain - part of body: Shoulder                Time: 3491-7915 OT Time Calculation (min): 28 min Charges:  OT  General Charges $OT Visit: 1 Visit OT Evaluation $OT Eval Moderate Complexity: 1 Mod OT Treatments $Self Care/Home Management : 8-22 mins  Sarya Linenberger MSOT, OTR/L Acute Rehab Pager: [phone] Office: [place] 07/22/2021, 10:06 AM

## 2021-07-22 NOTE — Plan of Care (Signed)

## 2021-07-22 NOTE — Progress Notes (Signed)
Orthopedics Progress Note  Subjective: Patient complaining of some shoulder pain this AM  Objective:  Vitals:   07/21/21 2312 07/22/21 0416  BP: (!) 144/68 (!) 136/58  Pulse: (!) 102 97  Resp: 16 16  Temp: (!) 97.3 F (36.3 C) (!) 97.4 F (36.3 C)  SpO2: 96% 97%    General: Awake and alert  Musculoskeletal: Left shoulder dressing CDI Neurovascularly intact  Lab Results  Component Value Date   WBC 9.0 07/21/2021   HGB 11.8 (L) 07/21/2021   HCT 38.4 07/21/2021   MCV 88.5 07/21/2021   PLT 332 07/21/2021       Component Value Date/Time   NA 137 07/21/2021 1441   K 4.4 07/21/2021 1441   CL 105 07/21/2021 1441   CO2 20 (L) 07/21/2021 1441   GLUCOSE 70 07/21/2021 1441   BUN 34 (H) 07/21/2021 1441   CREATININE 1.43 (H) 07/21/2021 1441   CALCIUM 9.5 07/21/2021 1441   GFRNONAA 38 (L) 07/21/2021 1441   GFRAA 56 (L) 03/10/2015 0445    Lab Results  Component Value Date   INR 1.02 03/04/2015   INR 0.96 09/28/2014    Assessment/Plan: POD #1 s/p Procedure(s): REVERSE SHOULDER ARTHROPLASTY Stable this AM. PT and OT and then discharge home if blood sugars normalize and patient clears therapy. She will do home exercises as instructed by PT and OT until follow up with Dr Veverly Fells in two weeks in the office No planned home health unless absolutely necessary  Doran Heater. Veverly Fells, MD 07/22/2021 7:02 AM

## 2021-07-22 NOTE — Anesthesia Postprocedure Evaluation (Signed)
Anesthesia Post Note  Patient: Alexis Hopkins  Procedure(s) Performed: REVERSE SHOULDER ARTHROPLASTY (Left: Shoulder)     Patient location during evaluation: PACU Anesthesia Type: General and Regional Level of consciousness: awake Pain management: pain level controlled Vital Signs Assessment: post-procedure vital signs reviewed and stable Respiratory status: spontaneous breathing, nonlabored ventilation, respiratory function stable and patient connected to nasal cannula oxygen Cardiovascular status: blood pressure returned to baseline and stable Postop Assessment: no apparent nausea or vomiting Anesthetic complications: no   No notable events documented.  Last Vitals:  Vitals:   07/21/21 2312 07/22/21 0416  BP: (!) 144/68 (!) 136/58  Pulse: (!) 102 97  Resp: 16 16  Temp: (!) 36.3 C (!) 36.3 C  SpO2: 96% 97%    Last Pain:  Vitals:   07/22/21 0416  TempSrc: Oral  PainSc:                  Brighten Buzzelli P Benna Arno

## 2021-07-22 NOTE — Op Note (Signed)
NAMERICHIE, VADALA MEDICAL RECORD NO: [id number] ACCOUNT NO: [id number] DATE OF BIRTH: 05/04/1946 FACILITY: [facility] LOCATION: [facility] PHYSICIAN: Doran Heater. Veverly Fells, MD  Operative Report   DATE OF PROCEDURE: 07/21/2021  PREOPERATIVE DIAGNOSIS:  Left shoulder comminuted and displaced proximal humerus fracture.  POSTOPERATIVE DIAGNOSES: 1.  Left shoulder comminuted and displaced proximal humerus fracture. 2.  Comminuted and displaced anterior glenoid fracture.  PROCEDURE PERFORMED:  Left reverse shoulder replacement using DePuy Delta Xtend prosthesis with tuberosity repair.  ATTENDING SURGEON:  Esmond Plants, MD  ASSISTANT:  Darol Destine, [place], who was scrubbed during the entire procedure, and necessary for satisfactory completion of surgery.  ANESTHESIA:  General anesthesia was used plus interscalene block.  ESTIMATED BLOOD LOSS:  200 mL  FLUID REPLACEMENT:  1500 mL crystalloid.  Instrument counts were correct.  No complications.  Perioperative antibiotics were given.  INDICATIONS:  The patient is a 75 year old female with a history of a hard fall onto her left shoulder, resulting in a comminuted proximal humerus fracture.  Preoperative workup including imaging shows a displaced fracture, not amenable to repair and  more amenable to replacement.  The patient also has a split in the anterior glenoid. Based on her fracture and her age, we elected to proceed with reverse shoulder replacement to provide a stable mechanics for the shoulder and eliminate pain and the best  outcome.  Informed consent obtained.  DESCRIPTION OF PROCEDURE:  After adequate level of anesthesia was achieved, the patient was positioned in the modified beach chair position.  Left shoulder correctly identified and sterilely prepped and draped in the usual manner.  A timeout called,  verifying correct patient, correct site.  We entered the patient's shoulder using a standard deltopectoral approach, starting at  the coracoid process extending down to the anterior humerus.  Dissection down through the subcutaneous tissues using Bovie.   We identified the cephalic vein and took that laterally with the deltoid.  Pectoralis was taken medially.  Conjoined tendon identified and retracted medially, tenodesed the biceps in situ with 0 Vicryl figure-of-eight suture.  We then retracted the  conjoined tendon and the deltoid with our Pax retractor.  At this point, we identified the fracture plane and divided the lesser tuberosity free from the remaining shoulder and we tagged with #2 FiberWire suture in a modified W stitch medial to the  lesser tuberosity and both suture limbs were coming out superficial to the tuberosity.  We then were able to get the humeral head out of the wound.  This was completely fractured and we used the humeral head for available bone graft.  We then debulked  the greater tuberosity and placed a total of 3 mattress sutures lateral to the greater tuberosity in the rotator cuff tendon, again in a W stitch fashion for extra purchase. I was not sure if I would keep the supraspinatus, but we tagged it anyway.  We  then went ahead and directed our attention towards the glenoid.  The glenoid was fractured. The anterior quarter of it was just broken off and not stable.  We then removed the cartilage off the remaining glenoid, found the center point for a guide pin,  reamed for the metaglene baseplate.  We then drilled our central peg hole that had about a 3 to 4 mm space posterior to our fractured anterior glenoid and we were able to then impact our glenoid baseplate into position.  It was stable.  We then placed a  42 screw inferiorly, a 36 screw  at the base of the coracoid and could not get a screw in the back because we did not have enough AP width, but we felt like our baseplate was stable.  We then secured a 38 standard glenosphere into position onto the  baseplate.  With that stable, I did a finger  sweep to make sure there was no additional bone fragments or anything else that was caught up in the bearing.  We then directed our attention back towards the humeral side.  We reamed up to a size 10  intramedullary diameter, then selected the 10 stem with the centered fracture metaphysis and placed that onto the stem in the 0 setting and placed this in 10 degrees of retroversion.  We impacted that in position.  We then reduced with a 38+3 poly and  were happy with our soft tissue tension and balancing.  We removed the trial components from the humerus.  We then selected the real Porocoat 10 stem with the fracture metaphysis set on the 0 setting, I did place a suture through the medial hole, which  would be an around-the-world stitch and around the stem.  We then used available bone graft in impaction grafting technique and impacted the stem in with a press-fit technique in 10 degrees of retroversion.  We were happy with our support and stability  of the humeral component.  We then selected the real 38+3 poly and impacted that on the humeral tray and reduced the shoulder.  Again, we were pleased with our soft tissue tension and balancing.  I then anatomically repaired the lesser tuberosity and the  greater tuberosity back anatomically to the stem.  We used bone graft adjacent to the Porocoat and in between the tuberosities, so we affixed the tuberosities to each other.  I did wind up removing the supraspinatus portion with the attached bone that  allowed [place] to have a better fit with this reverse shoulder implant, so we had infraspinatus and teres minor repaired back anatomically with tuberosities to the implant and also to the anterior subscap and lesser tuberosity.  We did use the  around-the-world stitch medial to the lesser tuberosity and lateral to the greater tuberosity.  Additional sutures through the anterior fin holes and we really had a solid tuberosity repair, everything moved together as a unit  with no binding or  restriction in motion.  I did a finger sweep around behind the rotator cuff and also underneath the conjoined tendon just to make sure that we did not have any bone fragments and everything was moving free and clear.  I also did 1 rotator interval stitch  as well with 0 Vicryl with #2 FiberWire figure-of-eight.  Once this was done, we irrigated thoroughly and then closed the deltopectoral interval with 0 Vicryl suture followed by 2-0 Vicryl for subcutaneous closure and staples for skin.  Sterile bandage  was applied and a shoulder sling.  The patient transported to the recovery room in stable condition.   SHW D: 07/21/2021 9:06:40 pm T: 07/22/2021 2:51:00 am  JOB: [id number]/ [id number]

## 2021-07-22 NOTE — Progress Notes (Signed)
PT Cancellation Note  Patient Details Name: Alexis Hopkins MRN: [id number] DOB: 01-27-1946   Cancelled Treatment:    Reason Eval/Treat Not Completed: PT screened, no needs identified, will sign off   Alvira Philips 07/22/2021, 8:59 AM Timber Lucarelli M,PT Acute Rehab Services [phone] (pager)

## 2021-07-25 ENCOUNTER — Encounter (HOSPITAL_COMMUNITY): Payer: Self-pay | Admitting: Orthopedic Surgery

## 2021-08-04 DIAGNOSIS — Z4789 Encounter for other orthopedic aftercare: Secondary | ICD-10-CM | POA: Diagnosis not present

## 2021-08-06 DIAGNOSIS — Z23 Encounter for immunization: Secondary | ICD-10-CM | POA: Diagnosis not present

## 2021-08-16 ENCOUNTER — Encounter (HOSPITAL_COMMUNITY): Payer: Self-pay | Admitting: Radiology

## 2021-08-19 DIAGNOSIS — M8580 Other specified disorders of bone density and structure, unspecified site: Secondary | ICD-10-CM | POA: Diagnosis not present

## 2021-08-19 DIAGNOSIS — I129 Hypertensive chronic kidney disease with stage 1 through stage 4 chronic kidney disease, or unspecified chronic kidney disease: Secondary | ICD-10-CM | POA: Diagnosis not present

## 2021-08-19 DIAGNOSIS — Z0000 Encounter for general adult medical examination without abnormal findings: Secondary | ICD-10-CM | POA: Diagnosis not present

## 2021-08-19 DIAGNOSIS — E782 Mixed hyperlipidemia: Secondary | ICD-10-CM | POA: Diagnosis not present

## 2021-08-19 DIAGNOSIS — E11319 Type 2 diabetes mellitus with unspecified diabetic retinopathy without macular edema: Secondary | ICD-10-CM | POA: Diagnosis not present

## 2021-08-19 DIAGNOSIS — G9050 Complex regional pain syndrome I, unspecified: Secondary | ICD-10-CM | POA: Diagnosis not present

## 2021-08-19 DIAGNOSIS — D649 Anemia, unspecified: Secondary | ICD-10-CM | POA: Diagnosis not present

## 2021-08-19 DIAGNOSIS — N1832 Chronic kidney disease, stage 3b: Secondary | ICD-10-CM | POA: Diagnosis not present

## 2021-08-19 DIAGNOSIS — Z8673 Personal history of transient ischemic attack (TIA), and cerebral infarction without residual deficits: Secondary | ICD-10-CM | POA: Diagnosis not present

## 2021-08-19 DIAGNOSIS — E041 Nontoxic single thyroid nodule: Secondary | ICD-10-CM | POA: Diagnosis not present

## 2021-09-01 DIAGNOSIS — Z4789 Encounter for other orthopedic aftercare: Secondary | ICD-10-CM | POA: Diagnosis not present

## 2021-09-28 ENCOUNTER — Ambulatory Visit: Payer: Medicare Other | Admitting: Dermatology

## 2021-10-04 DIAGNOSIS — M25512 Pain in left shoulder: Secondary | ICD-10-CM | POA: Diagnosis not present

## 2021-10-04 DIAGNOSIS — S42202D Unspecified fracture of upper end of left humerus, subsequent encounter for fracture with routine healing: Secondary | ICD-10-CM | POA: Diagnosis not present

## 2021-10-11 DIAGNOSIS — S42202D Unspecified fracture of upper end of left humerus, subsequent encounter for fracture with routine healing: Secondary | ICD-10-CM | POA: Diagnosis not present

## 2021-10-11 DIAGNOSIS — M25512 Pain in left shoulder: Secondary | ICD-10-CM | POA: Diagnosis not present

## 2021-10-18 DIAGNOSIS — M25512 Pain in left shoulder: Secondary | ICD-10-CM | POA: Diagnosis not present

## 2021-10-18 DIAGNOSIS — S42202D Unspecified fracture of upper end of left humerus, subsequent encounter for fracture with routine healing: Secondary | ICD-10-CM | POA: Diagnosis not present

## 2021-10-25 DIAGNOSIS — M25512 Pain in left shoulder: Secondary | ICD-10-CM | POA: Diagnosis not present

## 2021-10-25 DIAGNOSIS — S42202D Unspecified fracture of upper end of left humerus, subsequent encounter for fracture with routine healing: Secondary | ICD-10-CM | POA: Diagnosis not present

## 2021-11-01 DIAGNOSIS — Z4789 Encounter for other orthopedic aftercare: Secondary | ICD-10-CM | POA: Diagnosis not present

## 2022-02-14 DIAGNOSIS — H0102A Squamous blepharitis right eye, upper and lower eyelids: Secondary | ICD-10-CM | POA: Diagnosis not present

## 2022-02-14 DIAGNOSIS — H52222 Regular astigmatism, left eye: Secondary | ICD-10-CM | POA: Diagnosis not present

## 2022-02-14 DIAGNOSIS — E119 Type 2 diabetes mellitus without complications: Secondary | ICD-10-CM | POA: Diagnosis not present

## 2022-02-14 DIAGNOSIS — H0102B Squamous blepharitis left eye, upper and lower eyelids: Secondary | ICD-10-CM | POA: Diagnosis not present

## 2022-02-14 DIAGNOSIS — H5203 Hypermetropia, bilateral: Secondary | ICD-10-CM | POA: Diagnosis not present

## 2022-02-14 DIAGNOSIS — H43813 Vitreous degeneration, bilateral: Secondary | ICD-10-CM | POA: Diagnosis not present

## 2022-03-01 ENCOUNTER — Other Ambulatory Visit: Payer: Self-pay | Admitting: Family Medicine

## 2022-03-01 DIAGNOSIS — E1169 Type 2 diabetes mellitus with other specified complication: Secondary | ICD-10-CM | POA: Diagnosis not present

## 2022-03-01 DIAGNOSIS — I129 Hypertensive chronic kidney disease with stage 1 through stage 4 chronic kidney disease, or unspecified chronic kidney disease: Secondary | ICD-10-CM | POA: Diagnosis not present

## 2022-03-01 DIAGNOSIS — E041 Nontoxic single thyroid nodule: Secondary | ICD-10-CM | POA: Diagnosis not present

## 2022-03-01 DIAGNOSIS — B353 Tinea pedis: Secondary | ICD-10-CM | POA: Diagnosis not present

## 2022-03-01 DIAGNOSIS — E782 Mixed hyperlipidemia: Secondary | ICD-10-CM | POA: Diagnosis not present

## 2022-03-01 DIAGNOSIS — N1832 Chronic kidney disease, stage 3b: Secondary | ICD-10-CM | POA: Diagnosis not present

## 2022-03-02 ENCOUNTER — Ambulatory Visit
Admission: RE | Admit: 2022-03-02 | Discharge: 2022-03-02 | Disposition: A | Discharge status: Home / Self Care | Payer: Medicare Other | Source: Ambulatory Visit | Attending: Family Medicine | Admitting: Family Medicine

## 2022-03-02 DIAGNOSIS — E041 Nontoxic single thyroid nodule: Secondary | ICD-10-CM | POA: Diagnosis not present

## 2022-05-08 DIAGNOSIS — M9904 Segmental and somatic dysfunction of sacral region: Secondary | ICD-10-CM | POA: Diagnosis not present

## 2022-05-08 DIAGNOSIS — M9903 Segmental and somatic dysfunction of lumbar region: Secondary | ICD-10-CM | POA: Diagnosis not present

## 2022-05-08 DIAGNOSIS — M9905 Segmental and somatic dysfunction of pelvic region: Secondary | ICD-10-CM | POA: Diagnosis not present

## 2022-05-08 DIAGNOSIS — M5432 Sciatica, left side: Secondary | ICD-10-CM | POA: Diagnosis not present

## 2022-05-09 DIAGNOSIS — M9903 Segmental and somatic dysfunction of lumbar region: Secondary | ICD-10-CM | POA: Diagnosis not present

## 2022-05-09 DIAGNOSIS — M5432 Sciatica, left side: Secondary | ICD-10-CM | POA: Diagnosis not present

## 2022-05-09 DIAGNOSIS — M9904 Segmental and somatic dysfunction of sacral region: Secondary | ICD-10-CM | POA: Diagnosis not present

## 2022-05-09 DIAGNOSIS — M9905 Segmental and somatic dysfunction of pelvic region: Secondary | ICD-10-CM | POA: Diagnosis not present

## 2022-05-10 DIAGNOSIS — M9903 Segmental and somatic dysfunction of lumbar region: Secondary | ICD-10-CM | POA: Diagnosis not present

## 2022-05-10 DIAGNOSIS — M9905 Segmental and somatic dysfunction of pelvic region: Secondary | ICD-10-CM | POA: Diagnosis not present

## 2022-05-10 DIAGNOSIS — M9904 Segmental and somatic dysfunction of sacral region: Secondary | ICD-10-CM | POA: Diagnosis not present

## 2022-05-10 DIAGNOSIS — M5432 Sciatica, left side: Secondary | ICD-10-CM | POA: Diagnosis not present

## 2022-05-17 DIAGNOSIS — M5432 Sciatica, left side: Secondary | ICD-10-CM | POA: Diagnosis not present

## 2022-05-17 DIAGNOSIS — M9903 Segmental and somatic dysfunction of lumbar region: Secondary | ICD-10-CM | POA: Diagnosis not present

## 2022-05-17 DIAGNOSIS — M9904 Segmental and somatic dysfunction of sacral region: Secondary | ICD-10-CM | POA: Diagnosis not present

## 2022-05-17 DIAGNOSIS — M9905 Segmental and somatic dysfunction of pelvic region: Secondary | ICD-10-CM | POA: Diagnosis not present

## 2022-05-18 DIAGNOSIS — M5432 Sciatica, left side: Secondary | ICD-10-CM | POA: Diagnosis not present

## 2022-05-18 DIAGNOSIS — M9904 Segmental and somatic dysfunction of sacral region: Secondary | ICD-10-CM | POA: Diagnosis not present

## 2022-05-18 DIAGNOSIS — M9905 Segmental and somatic dysfunction of pelvic region: Secondary | ICD-10-CM | POA: Diagnosis not present

## 2022-05-18 DIAGNOSIS — M9903 Segmental and somatic dysfunction of lumbar region: Secondary | ICD-10-CM | POA: Diagnosis not present

## 2022-05-22 DIAGNOSIS — M9903 Segmental and somatic dysfunction of lumbar region: Secondary | ICD-10-CM | POA: Diagnosis not present

## 2022-05-22 DIAGNOSIS — M9905 Segmental and somatic dysfunction of pelvic region: Secondary | ICD-10-CM | POA: Diagnosis not present

## 2022-05-22 DIAGNOSIS — M5432 Sciatica, left side: Secondary | ICD-10-CM | POA: Diagnosis not present

## 2022-05-22 DIAGNOSIS — M9904 Segmental and somatic dysfunction of sacral region: Secondary | ICD-10-CM | POA: Diagnosis not present

## 2022-05-23 ENCOUNTER — Ambulatory Visit: Payer: Medicare Other | Admitting: Dermatology

## 2022-05-24 DIAGNOSIS — M9905 Segmental and somatic dysfunction of pelvic region: Secondary | ICD-10-CM | POA: Diagnosis not present

## 2022-05-24 DIAGNOSIS — M9904 Segmental and somatic dysfunction of sacral region: Secondary | ICD-10-CM | POA: Diagnosis not present

## 2022-05-24 DIAGNOSIS — M5432 Sciatica, left side: Secondary | ICD-10-CM | POA: Diagnosis not present

## 2022-05-24 DIAGNOSIS — M9903 Segmental and somatic dysfunction of lumbar region: Secondary | ICD-10-CM | POA: Diagnosis not present

## 2022-05-25 DIAGNOSIS — M9904 Segmental and somatic dysfunction of sacral region: Secondary | ICD-10-CM | POA: Diagnosis not present

## 2022-05-25 DIAGNOSIS — M9905 Segmental and somatic dysfunction of pelvic region: Secondary | ICD-10-CM | POA: Diagnosis not present

## 2022-05-25 DIAGNOSIS — M5432 Sciatica, left side: Secondary | ICD-10-CM | POA: Diagnosis not present

## 2022-05-25 DIAGNOSIS — M9903 Segmental and somatic dysfunction of lumbar region: Secondary | ICD-10-CM | POA: Diagnosis not present

## 2022-05-29 DIAGNOSIS — M9903 Segmental and somatic dysfunction of lumbar region: Secondary | ICD-10-CM | POA: Diagnosis not present

## 2022-05-29 DIAGNOSIS — M9904 Segmental and somatic dysfunction of sacral region: Secondary | ICD-10-CM | POA: Diagnosis not present

## 2022-05-29 DIAGNOSIS — M5432 Sciatica, left side: Secondary | ICD-10-CM | POA: Diagnosis not present

## 2022-05-29 DIAGNOSIS — M9905 Segmental and somatic dysfunction of pelvic region: Secondary | ICD-10-CM | POA: Diagnosis not present

## 2022-05-30 ENCOUNTER — Other Ambulatory Visit: Payer: Self-pay | Admitting: Family Medicine

## 2022-05-30 DIAGNOSIS — Z1231 Encounter for screening mammogram for malignant neoplasm of breast: Secondary | ICD-10-CM

## 2022-05-31 ENCOUNTER — Ambulatory Visit: Payer: Self-pay

## 2022-05-31 NOTE — Patient Outreach (Signed)
  Care Coordination   05/31/2022 Name: Alexis Hopkins MRN: [id number] DOB: 05-30-1946   Care Coordination Outreach Attempts:  An unsuccessful telephone outreach was attempted today to offer the patient information about available care coordination services as a benefit of their health plan.   Follow Up Plan:  Additional outreach attempts will be made to offer the patient care coordination information and services.   Encounter Outcome:  No Answer  Care Coordination Interventions Activated:  No   Care Coordination Interventions:  No, not indicated    [facility] [phone]

## 2022-06-01 DIAGNOSIS — M5432 Sciatica, left side: Secondary | ICD-10-CM | POA: Diagnosis not present

## 2022-06-01 DIAGNOSIS — M9903 Segmental and somatic dysfunction of lumbar region: Secondary | ICD-10-CM | POA: Diagnosis not present

## 2022-06-01 DIAGNOSIS — M9904 Segmental and somatic dysfunction of sacral region: Secondary | ICD-10-CM | POA: Diagnosis not present

## 2022-06-01 DIAGNOSIS — M9905 Segmental and somatic dysfunction of pelvic region: Secondary | ICD-10-CM | POA: Diagnosis not present

## 2022-06-06 DIAGNOSIS — M9904 Segmental and somatic dysfunction of sacral region: Secondary | ICD-10-CM | POA: Diagnosis not present

## 2022-06-06 DIAGNOSIS — M9903 Segmental and somatic dysfunction of lumbar region: Secondary | ICD-10-CM | POA: Diagnosis not present

## 2022-06-06 DIAGNOSIS — M9905 Segmental and somatic dysfunction of pelvic region: Secondary | ICD-10-CM | POA: Diagnosis not present

## 2022-06-06 DIAGNOSIS — M5432 Sciatica, left side: Secondary | ICD-10-CM | POA: Diagnosis not present

## 2022-06-12 DIAGNOSIS — M9905 Segmental and somatic dysfunction of pelvic region: Secondary | ICD-10-CM | POA: Diagnosis not present

## 2022-06-12 DIAGNOSIS — M9904 Segmental and somatic dysfunction of sacral region: Secondary | ICD-10-CM | POA: Diagnosis not present

## 2022-06-12 DIAGNOSIS — M9903 Segmental and somatic dysfunction of lumbar region: Secondary | ICD-10-CM | POA: Diagnosis not present

## 2022-06-12 DIAGNOSIS — M5432 Sciatica, left side: Secondary | ICD-10-CM | POA: Diagnosis not present

## 2022-06-19 DIAGNOSIS — M9903 Segmental and somatic dysfunction of lumbar region: Secondary | ICD-10-CM | POA: Diagnosis not present

## 2022-06-19 DIAGNOSIS — M9905 Segmental and somatic dysfunction of pelvic region: Secondary | ICD-10-CM | POA: Diagnosis not present

## 2022-06-19 DIAGNOSIS — M9904 Segmental and somatic dysfunction of sacral region: Secondary | ICD-10-CM | POA: Diagnosis not present

## 2022-06-19 DIAGNOSIS — M5432 Sciatica, left side: Secondary | ICD-10-CM | POA: Diagnosis not present

## 2022-06-20 ENCOUNTER — Ambulatory Visit: Payer: Medicare Other

## 2022-06-22 ENCOUNTER — Ambulatory Visit: Payer: Medicare Other

## 2022-06-26 DIAGNOSIS — M9903 Segmental and somatic dysfunction of lumbar region: Secondary | ICD-10-CM | POA: Diagnosis not present

## 2022-06-26 DIAGNOSIS — M9905 Segmental and somatic dysfunction of pelvic region: Secondary | ICD-10-CM | POA: Diagnosis not present

## 2022-06-26 DIAGNOSIS — M9904 Segmental and somatic dysfunction of sacral region: Secondary | ICD-10-CM | POA: Diagnosis not present

## 2022-06-26 DIAGNOSIS — M5432 Sciatica, left side: Secondary | ICD-10-CM | POA: Diagnosis not present

## 2022-07-01 DIAGNOSIS — Z23 Encounter for immunization: Secondary | ICD-10-CM | POA: Diagnosis not present

## 2022-07-03 DIAGNOSIS — M9905 Segmental and somatic dysfunction of pelvic region: Secondary | ICD-10-CM | POA: Diagnosis not present

## 2022-07-03 DIAGNOSIS — M5432 Sciatica, left side: Secondary | ICD-10-CM | POA: Diagnosis not present

## 2022-07-03 DIAGNOSIS — M9903 Segmental and somatic dysfunction of lumbar region: Secondary | ICD-10-CM | POA: Diagnosis not present

## 2022-07-03 DIAGNOSIS — M9904 Segmental and somatic dysfunction of sacral region: Secondary | ICD-10-CM | POA: Diagnosis not present

## 2022-07-10 DIAGNOSIS — M9903 Segmental and somatic dysfunction of lumbar region: Secondary | ICD-10-CM | POA: Diagnosis not present

## 2022-07-10 DIAGNOSIS — M9904 Segmental and somatic dysfunction of sacral region: Secondary | ICD-10-CM | POA: Diagnosis not present

## 2022-07-10 DIAGNOSIS — M5432 Sciatica, left side: Secondary | ICD-10-CM | POA: Diagnosis not present

## 2022-07-10 DIAGNOSIS — M9905 Segmental and somatic dysfunction of pelvic region: Secondary | ICD-10-CM | POA: Diagnosis not present

## 2022-07-24 DIAGNOSIS — M9905 Segmental and somatic dysfunction of pelvic region: Secondary | ICD-10-CM | POA: Diagnosis not present

## 2022-07-24 DIAGNOSIS — M9903 Segmental and somatic dysfunction of lumbar region: Secondary | ICD-10-CM | POA: Diagnosis not present

## 2022-07-24 DIAGNOSIS — M5432 Sciatica, left side: Secondary | ICD-10-CM | POA: Diagnosis not present

## 2022-07-24 DIAGNOSIS — M9904 Segmental and somatic dysfunction of sacral region: Secondary | ICD-10-CM | POA: Diagnosis not present

## 2022-07-31 ENCOUNTER — Ambulatory Visit
Admission: RE | Admit: 2022-07-31 | Discharge: 2022-07-31 | Disposition: A | Discharge status: Home / Self Care | Payer: Medicare Other | Source: Ambulatory Visit | Attending: Family Medicine | Admitting: Family Medicine

## 2022-07-31 DIAGNOSIS — Z1231 Encounter for screening mammogram for malignant neoplasm of breast: Secondary | ICD-10-CM | POA: Diagnosis not present

## 2022-08-04 DIAGNOSIS — B353 Tinea pedis: Secondary | ICD-10-CM | POA: Diagnosis not present

## 2022-08-04 DIAGNOSIS — L570 Actinic keratosis: Secondary | ICD-10-CM | POA: Diagnosis not present

## 2022-08-04 DIAGNOSIS — D225 Melanocytic nevi of trunk: Secondary | ICD-10-CM | POA: Diagnosis not present

## 2022-08-04 DIAGNOSIS — L821 Other seborrheic keratosis: Secondary | ICD-10-CM | POA: Diagnosis not present

## 2022-08-04 DIAGNOSIS — L814 Other melanin hyperpigmentation: Secondary | ICD-10-CM | POA: Diagnosis not present

## 2022-08-07 DIAGNOSIS — M9904 Segmental and somatic dysfunction of sacral region: Secondary | ICD-10-CM | POA: Diagnosis not present

## 2022-08-07 DIAGNOSIS — M5432 Sciatica, left side: Secondary | ICD-10-CM | POA: Diagnosis not present

## 2022-08-07 DIAGNOSIS — M9905 Segmental and somatic dysfunction of pelvic region: Secondary | ICD-10-CM | POA: Diagnosis not present

## 2022-08-07 DIAGNOSIS — M9903 Segmental and somatic dysfunction of lumbar region: Secondary | ICD-10-CM | POA: Diagnosis not present

## 2022-08-21 DIAGNOSIS — M9903 Segmental and somatic dysfunction of lumbar region: Secondary | ICD-10-CM | POA: Diagnosis not present

## 2022-08-21 DIAGNOSIS — M9904 Segmental and somatic dysfunction of sacral region: Secondary | ICD-10-CM | POA: Diagnosis not present

## 2022-08-21 DIAGNOSIS — M9905 Segmental and somatic dysfunction of pelvic region: Secondary | ICD-10-CM | POA: Diagnosis not present

## 2022-08-21 DIAGNOSIS — M5432 Sciatica, left side: Secondary | ICD-10-CM | POA: Diagnosis not present

## 2022-09-04 DIAGNOSIS — M9904 Segmental and somatic dysfunction of sacral region: Secondary | ICD-10-CM | POA: Diagnosis not present

## 2022-09-04 DIAGNOSIS — M9903 Segmental and somatic dysfunction of lumbar region: Secondary | ICD-10-CM | POA: Diagnosis not present

## 2022-09-04 DIAGNOSIS — M5432 Sciatica, left side: Secondary | ICD-10-CM | POA: Diagnosis not present

## 2022-09-04 DIAGNOSIS — M9905 Segmental and somatic dysfunction of pelvic region: Secondary | ICD-10-CM | POA: Diagnosis not present

## 2022-09-11 DIAGNOSIS — Z8673 Personal history of transient ischemic attack (TIA), and cerebral infarction without residual deficits: Secondary | ICD-10-CM | POA: Diagnosis not present

## 2022-09-11 DIAGNOSIS — Z0000 Encounter for general adult medical examination without abnormal findings: Secondary | ICD-10-CM | POA: Diagnosis not present

## 2022-09-11 DIAGNOSIS — E1169 Type 2 diabetes mellitus with other specified complication: Secondary | ICD-10-CM | POA: Diagnosis not present

## 2022-09-11 DIAGNOSIS — I129 Hypertensive chronic kidney disease with stage 1 through stage 4 chronic kidney disease, or unspecified chronic kidney disease: Secondary | ICD-10-CM | POA: Diagnosis not present

## 2022-09-11 DIAGNOSIS — N1832 Chronic kidney disease, stage 3b: Secondary | ICD-10-CM | POA: Diagnosis not present

## 2022-09-11 DIAGNOSIS — E11319 Type 2 diabetes mellitus with unspecified diabetic retinopathy without macular edema: Secondary | ICD-10-CM | POA: Diagnosis not present

## 2022-09-11 DIAGNOSIS — E782 Mixed hyperlipidemia: Secondary | ICD-10-CM | POA: Diagnosis not present

## 2022-09-11 DIAGNOSIS — G9050 Complex regional pain syndrome I, unspecified: Secondary | ICD-10-CM | POA: Diagnosis not present

## 2022-09-11 DIAGNOSIS — E041 Nontoxic single thyroid nodule: Secondary | ICD-10-CM | POA: Diagnosis not present

## 2022-09-11 DIAGNOSIS — Z23 Encounter for immunization: Secondary | ICD-10-CM | POA: Diagnosis not present

## 2022-09-11 DIAGNOSIS — M8580 Other specified disorders of bone density and structure, unspecified site: Secondary | ICD-10-CM | POA: Diagnosis not present

## 2022-09-18 DIAGNOSIS — M9904 Segmental and somatic dysfunction of sacral region: Secondary | ICD-10-CM | POA: Diagnosis not present

## 2022-09-18 DIAGNOSIS — M9903 Segmental and somatic dysfunction of lumbar region: Secondary | ICD-10-CM | POA: Diagnosis not present

## 2022-09-18 DIAGNOSIS — M5432 Sciatica, left side: Secondary | ICD-10-CM | POA: Diagnosis not present

## 2022-09-18 DIAGNOSIS — M9905 Segmental and somatic dysfunction of pelvic region: Secondary | ICD-10-CM | POA: Diagnosis not present

## 2022-10-09 DIAGNOSIS — M5432 Sciatica, left side: Secondary | ICD-10-CM | POA: Diagnosis not present

## 2022-10-09 DIAGNOSIS — M9904 Segmental and somatic dysfunction of sacral region: Secondary | ICD-10-CM | POA: Diagnosis not present

## 2022-10-09 DIAGNOSIS — M9905 Segmental and somatic dysfunction of pelvic region: Secondary | ICD-10-CM | POA: Diagnosis not present

## 2022-10-09 DIAGNOSIS — M9903 Segmental and somatic dysfunction of lumbar region: Secondary | ICD-10-CM | POA: Diagnosis not present

## 2022-10-18 DIAGNOSIS — Z86010 Personal history of colonic polyps: Secondary | ICD-10-CM | POA: Diagnosis not present

## 2022-10-18 DIAGNOSIS — R195 Other fecal abnormalities: Secondary | ICD-10-CM | POA: Diagnosis not present

## 2022-10-23 DIAGNOSIS — M9905 Segmental and somatic dysfunction of pelvic region: Secondary | ICD-10-CM | POA: Diagnosis not present

## 2022-10-23 DIAGNOSIS — M9904 Segmental and somatic dysfunction of sacral region: Secondary | ICD-10-CM | POA: Diagnosis not present

## 2022-10-23 DIAGNOSIS — M9903 Segmental and somatic dysfunction of lumbar region: Secondary | ICD-10-CM | POA: Diagnosis not present

## 2022-10-23 DIAGNOSIS — M5432 Sciatica, left side: Secondary | ICD-10-CM | POA: Diagnosis not present

## 2022-10-28 DIAGNOSIS — J40 Bronchitis, not specified as acute or chronic: Secondary | ICD-10-CM | POA: Diagnosis not present

## 2022-11-06 DIAGNOSIS — M9905 Segmental and somatic dysfunction of pelvic region: Secondary | ICD-10-CM | POA: Diagnosis not present

## 2022-11-06 DIAGNOSIS — M9904 Segmental and somatic dysfunction of sacral region: Secondary | ICD-10-CM | POA: Diagnosis not present

## 2022-11-06 DIAGNOSIS — M9903 Segmental and somatic dysfunction of lumbar region: Secondary | ICD-10-CM | POA: Diagnosis not present

## 2022-11-06 DIAGNOSIS — M5432 Sciatica, left side: Secondary | ICD-10-CM | POA: Diagnosis not present

## 2022-11-20 DIAGNOSIS — M9905 Segmental and somatic dysfunction of pelvic region: Secondary | ICD-10-CM | POA: Diagnosis not present

## 2022-11-20 DIAGNOSIS — M9903 Segmental and somatic dysfunction of lumbar region: Secondary | ICD-10-CM | POA: Diagnosis not present

## 2022-11-20 DIAGNOSIS — M5432 Sciatica, left side: Secondary | ICD-10-CM | POA: Diagnosis not present

## 2022-11-20 DIAGNOSIS — M9904 Segmental and somatic dysfunction of sacral region: Secondary | ICD-10-CM | POA: Diagnosis not present

## 2022-11-20 IMAGING — US US THYROID
1 series · 13 of 25 positions shown · non-contrast
Comparison: Prior thyroid ultrasound 03/18/2014

CLINICAL DATA: Incidental on CT.

EXAM:
THYROID ULTRASOUND
TECHNIQUE: Ultrasound examination of the thyroid gland and adjacent soft
tissues was performed.

[Series 1: us thyroid · 0.06mm/px · 13 of 76 slices shown]
[im 1/76]
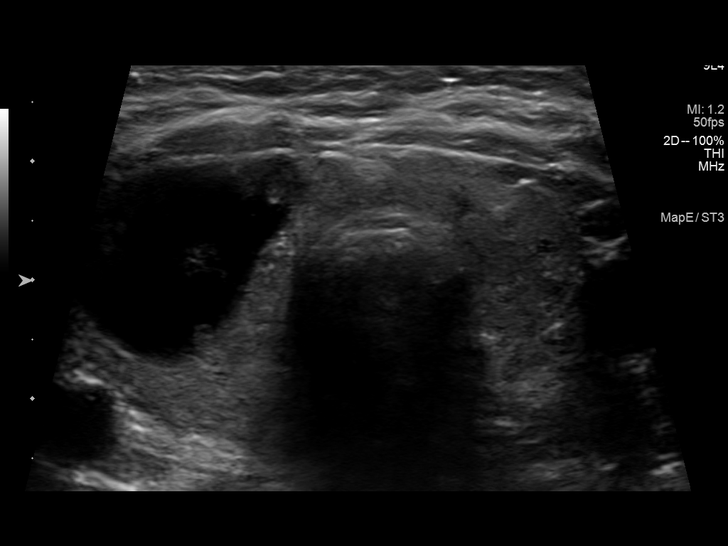
[im 7/76]
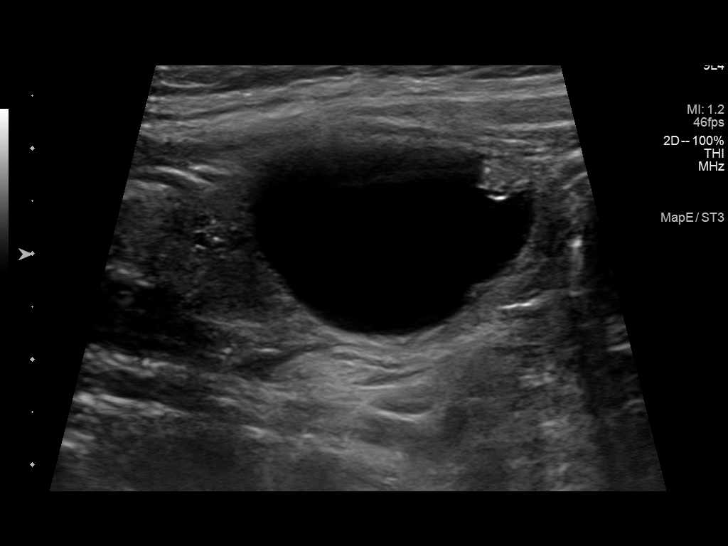
[im 13/76]
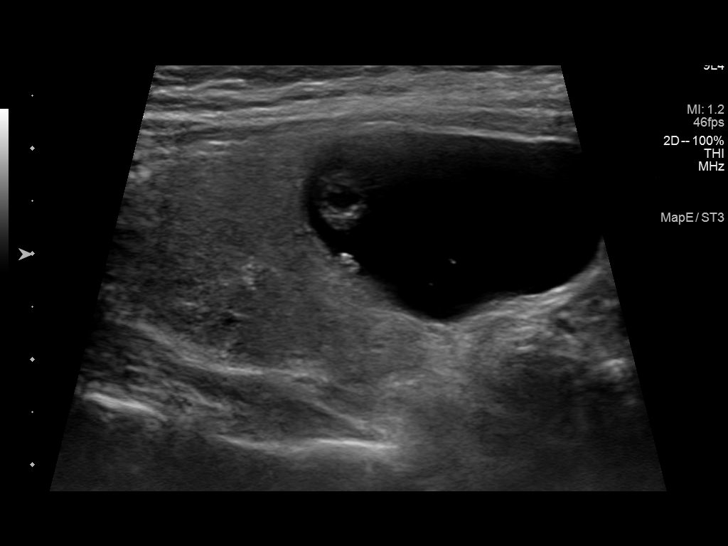
[im 19/76]
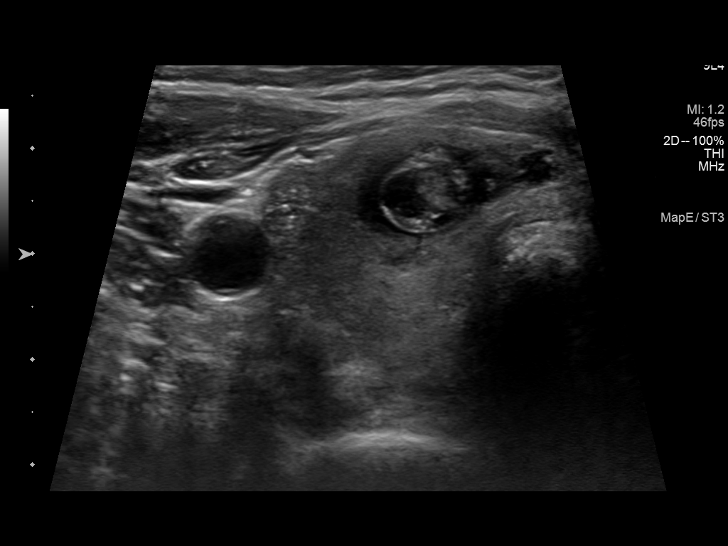
[im 26/76]
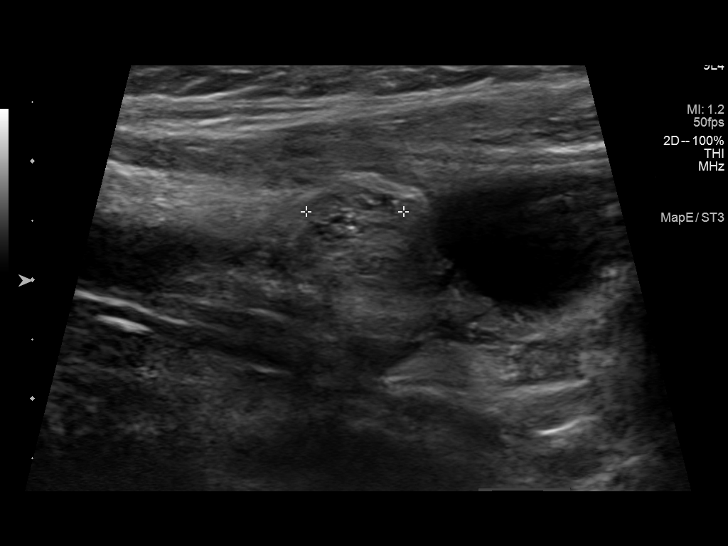
[im 32/76]
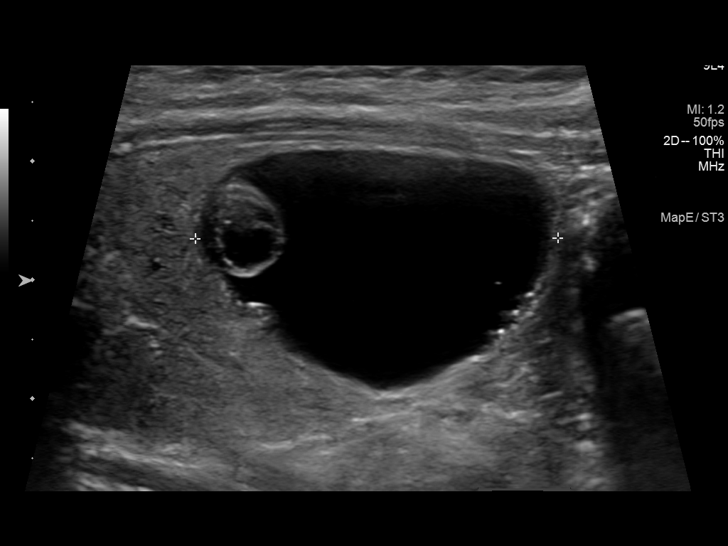
[im 38/76]
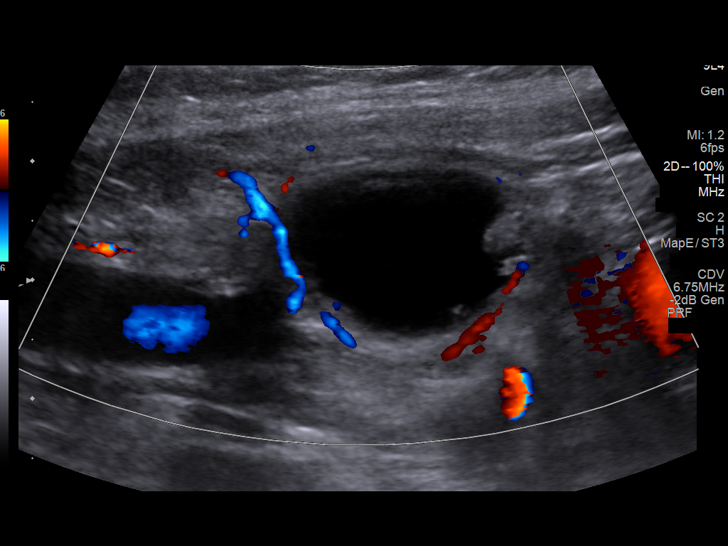
[im 44/76]
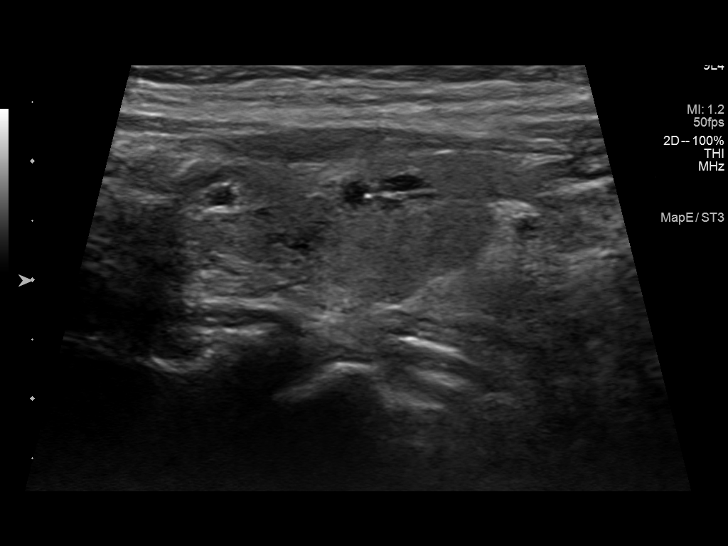
[im 51/76]
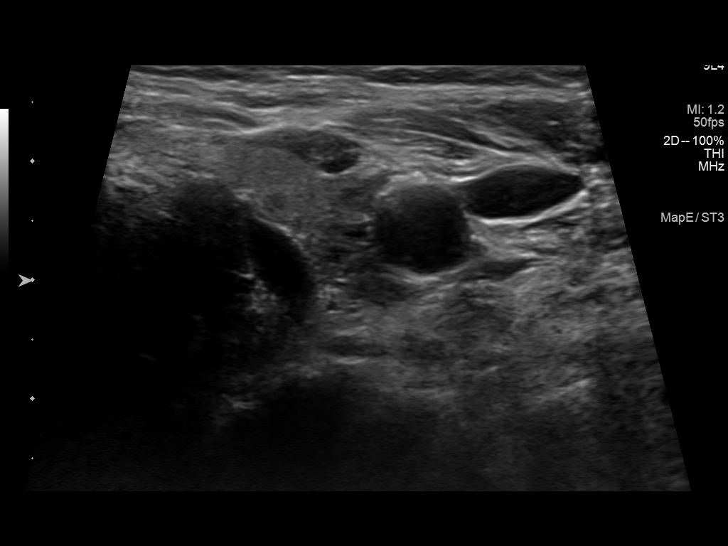
[im 57/76]
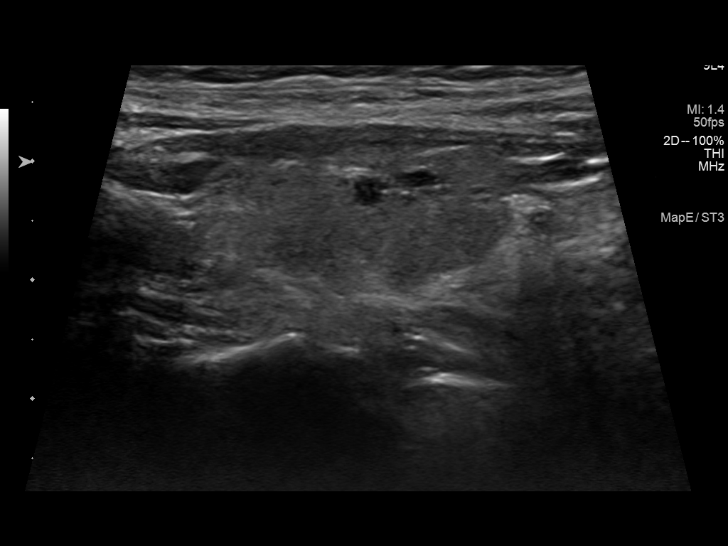
[im 63/76]
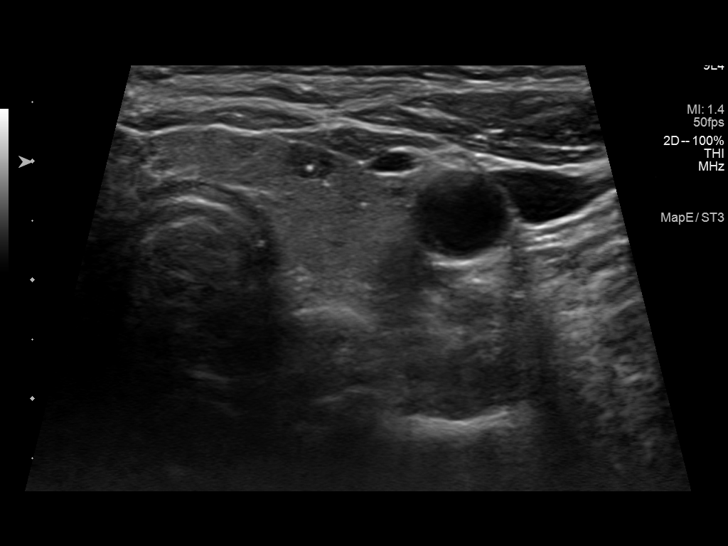
[im 69/76]
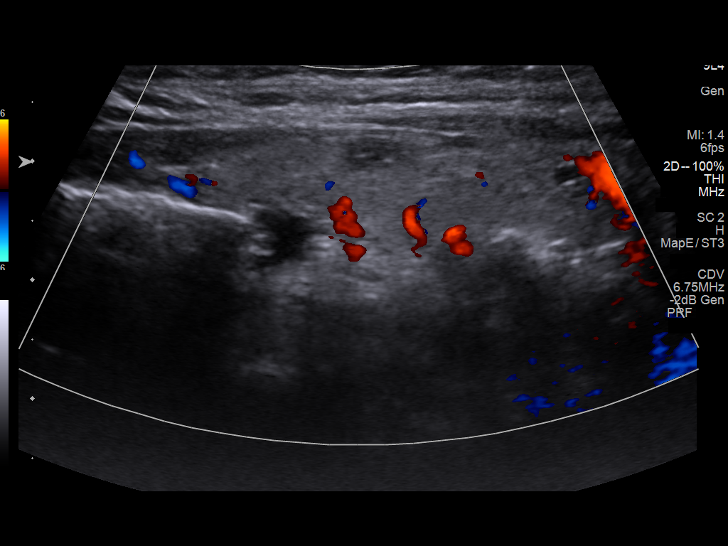
[im 76/76]
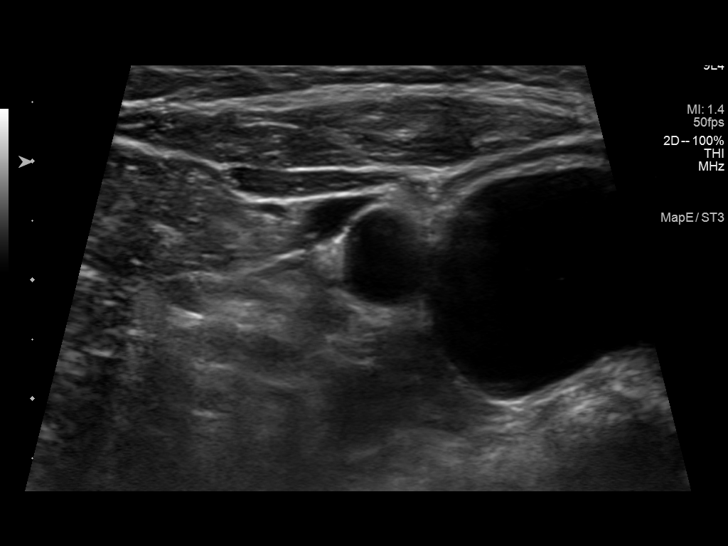

[13 of 25 positions shown; findings below may reference images not displayed]

FINDINGS: Parenchymal Echotexture: Moderately heterogenous

Isthmus: 0.7 cm

Right lobe: 5.0 x 2.3 x 2.2 cm

Left lobe: 4.1 x 1.2 x 1.4 cm

_________________________________________________________

Estimated total number of nodules >/= 1 cm: 1

Number of spongiform nodules >/=  2 cm not described below (TR1): 0

Number of mixed cystic and solid nodules >/= 1.5 cm not described
below (TR2): 0

_________________________________________________________

Large predominantly anechoic simple cyst with peripheral colloid
artifact in the right mid to lower gland measures up to 3.1 x 2.9 x
2.0 cm. This lesion remains low risk/benign by TI-RADS criteria
(TI-RADS 2). Additional tiny subcentimeter cysts are also noted
bilaterally.
IMPRESSION: The abnormality seen on prior CT corresponds with a large colloid
cyst in the right lower gland. This lesion does not meet criteria to
warrant biopsy or dedicated imaging surveillance.

The above is in keeping with the ACR TI-RADS recommendations - [HOSPITAL] 1793;[DATE].

## 2022-12-04 DIAGNOSIS — M5432 Sciatica, left side: Secondary | ICD-10-CM | POA: Diagnosis not present

## 2022-12-04 DIAGNOSIS — M9904 Segmental and somatic dysfunction of sacral region: Secondary | ICD-10-CM | POA: Diagnosis not present

## 2022-12-04 DIAGNOSIS — M9905 Segmental and somatic dysfunction of pelvic region: Secondary | ICD-10-CM | POA: Diagnosis not present

## 2022-12-04 DIAGNOSIS — M9903 Segmental and somatic dysfunction of lumbar region: Secondary | ICD-10-CM | POA: Diagnosis not present

## 2022-12-14 DIAGNOSIS — Z09 Encounter for follow-up examination after completed treatment for conditions other than malignant neoplasm: Secondary | ICD-10-CM | POA: Diagnosis not present

## 2022-12-14 DIAGNOSIS — K635 Polyp of colon: Secondary | ICD-10-CM | POA: Diagnosis not present

## 2022-12-14 DIAGNOSIS — Z800 Family history of malignant neoplasm of digestive organs: Secondary | ICD-10-CM | POA: Diagnosis not present

## 2022-12-14 DIAGNOSIS — K5730 Diverticulosis of large intestine without perforation or abscess without bleeding: Secondary | ICD-10-CM | POA: Diagnosis not present

## 2022-12-14 DIAGNOSIS — Z86010 Personal history of colonic polyps: Secondary | ICD-10-CM | POA: Diagnosis not present

## 2022-12-18 DIAGNOSIS — M9904 Segmental and somatic dysfunction of sacral region: Secondary | ICD-10-CM | POA: Diagnosis not present

## 2022-12-18 DIAGNOSIS — M9905 Segmental and somatic dysfunction of pelvic region: Secondary | ICD-10-CM | POA: Diagnosis not present

## 2022-12-18 DIAGNOSIS — K635 Polyp of colon: Secondary | ICD-10-CM | POA: Diagnosis not present

## 2022-12-18 DIAGNOSIS — M5432 Sciatica, left side: Secondary | ICD-10-CM | POA: Diagnosis not present

## 2022-12-18 DIAGNOSIS — M9903 Segmental and somatic dysfunction of lumbar region: Secondary | ICD-10-CM | POA: Diagnosis not present

## 2023-01-01 DIAGNOSIS — M5432 Sciatica, left side: Secondary | ICD-10-CM | POA: Diagnosis not present

## 2023-01-01 DIAGNOSIS — M9904 Segmental and somatic dysfunction of sacral region: Secondary | ICD-10-CM | POA: Diagnosis not present

## 2023-01-01 DIAGNOSIS — M9905 Segmental and somatic dysfunction of pelvic region: Secondary | ICD-10-CM | POA: Diagnosis not present

## 2023-01-01 DIAGNOSIS — M9903 Segmental and somatic dysfunction of lumbar region: Secondary | ICD-10-CM | POA: Diagnosis not present

## 2023-01-15 DIAGNOSIS — M9903 Segmental and somatic dysfunction of lumbar region: Secondary | ICD-10-CM | POA: Diagnosis not present

## 2023-01-15 DIAGNOSIS — M9905 Segmental and somatic dysfunction of pelvic region: Secondary | ICD-10-CM | POA: Diagnosis not present

## 2023-01-15 DIAGNOSIS — M5432 Sciatica, left side: Secondary | ICD-10-CM | POA: Diagnosis not present

## 2023-01-15 DIAGNOSIS — M9904 Segmental and somatic dysfunction of sacral region: Secondary | ICD-10-CM | POA: Diagnosis not present

## 2023-01-29 DIAGNOSIS — M5432 Sciatica, left side: Secondary | ICD-10-CM | POA: Diagnosis not present

## 2023-01-29 DIAGNOSIS — M9904 Segmental and somatic dysfunction of sacral region: Secondary | ICD-10-CM | POA: Diagnosis not present

## 2023-01-29 DIAGNOSIS — M9903 Segmental and somatic dysfunction of lumbar region: Secondary | ICD-10-CM | POA: Diagnosis not present

## 2023-01-29 DIAGNOSIS — M9905 Segmental and somatic dysfunction of pelvic region: Secondary | ICD-10-CM | POA: Diagnosis not present

## 2023-02-12 DIAGNOSIS — M9904 Segmental and somatic dysfunction of sacral region: Secondary | ICD-10-CM | POA: Diagnosis not present

## 2023-02-12 DIAGNOSIS — M9903 Segmental and somatic dysfunction of lumbar region: Secondary | ICD-10-CM | POA: Diagnosis not present

## 2023-02-12 DIAGNOSIS — M5432 Sciatica, left side: Secondary | ICD-10-CM | POA: Diagnosis not present

## 2023-02-12 DIAGNOSIS — M9905 Segmental and somatic dysfunction of pelvic region: Secondary | ICD-10-CM | POA: Diagnosis not present

## 2023-02-22 ENCOUNTER — Other Ambulatory Visit: Payer: Self-pay | Admitting: Family Medicine

## 2023-02-22 DIAGNOSIS — I499 Cardiac arrhythmia, unspecified: Secondary | ICD-10-CM | POA: Diagnosis not present

## 2023-02-22 DIAGNOSIS — E1169 Type 2 diabetes mellitus with other specified complication: Secondary | ICD-10-CM | POA: Diagnosis not present

## 2023-02-22 DIAGNOSIS — I129 Hypertensive chronic kidney disease with stage 1 through stage 4 chronic kidney disease, or unspecified chronic kidney disease: Secondary | ICD-10-CM | POA: Diagnosis not present

## 2023-02-22 DIAGNOSIS — E1122 Type 2 diabetes mellitus with diabetic chronic kidney disease: Secondary | ICD-10-CM | POA: Diagnosis not present

## 2023-02-22 DIAGNOSIS — N1832 Chronic kidney disease, stage 3b: Secondary | ICD-10-CM | POA: Diagnosis not present

## 2023-02-22 DIAGNOSIS — E782 Mixed hyperlipidemia: Secondary | ICD-10-CM | POA: Diagnosis not present

## 2023-02-22 DIAGNOSIS — K824 Cholesterolosis of gallbladder: Secondary | ICD-10-CM | POA: Diagnosis not present

## 2023-02-27 DIAGNOSIS — M9903 Segmental and somatic dysfunction of lumbar region: Secondary | ICD-10-CM | POA: Diagnosis not present

## 2023-02-27 DIAGNOSIS — M5432 Sciatica, left side: Secondary | ICD-10-CM | POA: Diagnosis not present

## 2023-02-27 DIAGNOSIS — M9905 Segmental and somatic dysfunction of pelvic region: Secondary | ICD-10-CM | POA: Diagnosis not present

## 2023-02-27 DIAGNOSIS — M9904 Segmental and somatic dysfunction of sacral region: Secondary | ICD-10-CM | POA: Diagnosis not present

## 2023-03-02 ENCOUNTER — Ambulatory Visit
Admission: RE | Admit: 2023-03-02 | Discharge: 2023-03-02 | Disposition: A | Discharge status: Home / Self Care | Payer: Medicare Other | Source: Ambulatory Visit | Attending: Family Medicine | Admitting: Family Medicine

## 2023-03-02 DIAGNOSIS — K824 Cholesterolosis of gallbladder: Secondary | ICD-10-CM | POA: Diagnosis not present

## 2023-03-02 DIAGNOSIS — K769 Liver disease, unspecified: Secondary | ICD-10-CM | POA: Diagnosis not present

## 2023-03-12 DIAGNOSIS — M9903 Segmental and somatic dysfunction of lumbar region: Secondary | ICD-10-CM | POA: Diagnosis not present

## 2023-03-12 DIAGNOSIS — M5432 Sciatica, left side: Secondary | ICD-10-CM | POA: Diagnosis not present

## 2023-03-12 DIAGNOSIS — M9905 Segmental and somatic dysfunction of pelvic region: Secondary | ICD-10-CM | POA: Diagnosis not present

## 2023-03-12 DIAGNOSIS — M9904 Segmental and somatic dysfunction of sacral region: Secondary | ICD-10-CM | POA: Diagnosis not present

## 2023-03-26 DIAGNOSIS — M9904 Segmental and somatic dysfunction of sacral region: Secondary | ICD-10-CM | POA: Diagnosis not present

## 2023-03-26 DIAGNOSIS — M9905 Segmental and somatic dysfunction of pelvic region: Secondary | ICD-10-CM | POA: Diagnosis not present

## 2023-03-26 DIAGNOSIS — M9903 Segmental and somatic dysfunction of lumbar region: Secondary | ICD-10-CM | POA: Diagnosis not present

## 2023-03-26 DIAGNOSIS — M5432 Sciatica, left side: Secondary | ICD-10-CM | POA: Diagnosis not present

## 2023-04-09 DIAGNOSIS — M9903 Segmental and somatic dysfunction of lumbar region: Secondary | ICD-10-CM | POA: Diagnosis not present

## 2023-04-09 DIAGNOSIS — M5432 Sciatica, left side: Secondary | ICD-10-CM | POA: Diagnosis not present

## 2023-04-09 DIAGNOSIS — M9904 Segmental and somatic dysfunction of sacral region: Secondary | ICD-10-CM | POA: Diagnosis not present

## 2023-04-09 DIAGNOSIS — M9905 Segmental and somatic dysfunction of pelvic region: Secondary | ICD-10-CM | POA: Diagnosis not present

## 2023-04-17 DIAGNOSIS — E119 Type 2 diabetes mellitus without complications: Secondary | ICD-10-CM | POA: Diagnosis not present

## 2023-04-17 DIAGNOSIS — Z961 Presence of intraocular lens: Secondary | ICD-10-CM | POA: Diagnosis not present

## 2023-04-17 DIAGNOSIS — H16223 Keratoconjunctivitis sicca, not specified as Sjogren's, bilateral: Secondary | ICD-10-CM | POA: Diagnosis not present

## 2023-04-17 DIAGNOSIS — H43392 Other vitreous opacities, left eye: Secondary | ICD-10-CM | POA: Diagnosis not present

## 2023-04-23 DIAGNOSIS — M5432 Sciatica, left side: Secondary | ICD-10-CM | POA: Diagnosis not present

## 2023-04-23 DIAGNOSIS — M9905 Segmental and somatic dysfunction of pelvic region: Secondary | ICD-10-CM | POA: Diagnosis not present

## 2023-04-23 DIAGNOSIS — M9903 Segmental and somatic dysfunction of lumbar region: Secondary | ICD-10-CM | POA: Diagnosis not present

## 2023-04-23 DIAGNOSIS — M9904 Segmental and somatic dysfunction of sacral region: Secondary | ICD-10-CM | POA: Diagnosis not present

## 2023-05-08 DIAGNOSIS — M9903 Segmental and somatic dysfunction of lumbar region: Secondary | ICD-10-CM | POA: Diagnosis not present

## 2023-05-08 DIAGNOSIS — M5432 Sciatica, left side: Secondary | ICD-10-CM | POA: Diagnosis not present

## 2023-05-08 DIAGNOSIS — M9904 Segmental and somatic dysfunction of sacral region: Secondary | ICD-10-CM | POA: Diagnosis not present

## 2023-05-08 DIAGNOSIS — M9905 Segmental and somatic dysfunction of pelvic region: Secondary | ICD-10-CM | POA: Diagnosis not present

## 2023-05-21 DIAGNOSIS — M9903 Segmental and somatic dysfunction of lumbar region: Secondary | ICD-10-CM | POA: Diagnosis not present

## 2023-05-21 DIAGNOSIS — M5432 Sciatica, left side: Secondary | ICD-10-CM | POA: Diagnosis not present

## 2023-05-21 DIAGNOSIS — M9904 Segmental and somatic dysfunction of sacral region: Secondary | ICD-10-CM | POA: Diagnosis not present

## 2023-05-21 DIAGNOSIS — M9905 Segmental and somatic dysfunction of pelvic region: Secondary | ICD-10-CM | POA: Diagnosis not present

## 2023-06-16 DIAGNOSIS — Z23 Encounter for immunization: Secondary | ICD-10-CM | POA: Diagnosis not present

## 2023-08-09 DIAGNOSIS — D1801 Hemangioma of skin and subcutaneous tissue: Secondary | ICD-10-CM | POA: Diagnosis not present

## 2023-08-09 DIAGNOSIS — Z85828 Personal history of other malignant neoplasm of skin: Secondary | ICD-10-CM | POA: Diagnosis not present

## 2023-08-09 DIAGNOSIS — L570 Actinic keratosis: Secondary | ICD-10-CM | POA: Diagnosis not present

## 2023-08-09 DIAGNOSIS — L821 Other seborrheic keratosis: Secondary | ICD-10-CM | POA: Diagnosis not present

## 2023-08-09 DIAGNOSIS — B353 Tinea pedis: Secondary | ICD-10-CM | POA: Diagnosis not present

## 2023-08-09 DIAGNOSIS — L814 Other melanin hyperpigmentation: Secondary | ICD-10-CM | POA: Diagnosis not present

## 2023-08-09 DIAGNOSIS — Z08 Encounter for follow-up examination after completed treatment for malignant neoplasm: Secondary | ICD-10-CM | POA: Diagnosis not present

## 2023-08-12 ENCOUNTER — Telehealth: Payer: Medicare Other | Admitting: Nurse Practitioner

## 2023-08-12 DIAGNOSIS — J069 Acute upper respiratory infection, unspecified: Secondary | ICD-10-CM

## 2023-08-12 MED ORDER — PREDNISONE 20 MG PO TABS
20.0000 mg | ORAL_TABLET | Freq: Every day | ORAL | 0 refills | Status: DC
Start: 2023-08-12 — End: 2023-12-13

## 2023-08-12 NOTE — Progress Notes (Signed)
I have spent 5 minutes in review of e-visit questionnaire, review and updating patient chart, medical decision making and response to patient.  ° °Jerrell Mangel W Secilia Apps, NP ° °  °

## 2023-08-12 NOTE — Progress Notes (Signed)

## 2023-08-14 ENCOUNTER — Other Ambulatory Visit: Payer: Self-pay | Admitting: Family Medicine

## 2023-08-14 DIAGNOSIS — Z96612 Presence of left artificial shoulder joint: Secondary | ICD-10-CM | POA: Diagnosis not present

## 2023-08-14 DIAGNOSIS — J069 Acute upper respiratory infection, unspecified: Secondary | ICD-10-CM | POA: Diagnosis not present

## 2023-08-14 DIAGNOSIS — R051 Acute cough: Secondary | ICD-10-CM | POA: Diagnosis not present

## 2023-08-14 DIAGNOSIS — H1031 Unspecified acute conjunctivitis, right eye: Secondary | ICD-10-CM | POA: Diagnosis not present

## 2023-08-14 DIAGNOSIS — N281 Cyst of kidney, acquired: Secondary | ICD-10-CM

## 2023-08-14 DIAGNOSIS — Z96611 Presence of right artificial shoulder joint: Secondary | ICD-10-CM | POA: Diagnosis not present

## 2023-08-14 DIAGNOSIS — S2241XA Multiple fractures of ribs, right side, initial encounter for closed fracture: Secondary | ICD-10-CM | POA: Diagnosis not present

## 2023-08-14 DIAGNOSIS — R509 Fever, unspecified: Secondary | ICD-10-CM | POA: Diagnosis not present

## 2023-08-20 ENCOUNTER — Inpatient Hospital Stay
Admission: RE | Admit: 2023-08-20 | Discharge: 2023-08-20 | Discharge status: Home / Self Care | Payer: Medicare Other | Source: Ambulatory Visit | Attending: Family Medicine | Admitting: Family Medicine

## 2023-08-20 DIAGNOSIS — N281 Cyst of kidney, acquired: Secondary | ICD-10-CM

## 2023-09-11 ENCOUNTER — Other Ambulatory Visit: Payer: Self-pay | Admitting: Family Medicine

## 2023-09-11 DIAGNOSIS — Z1231 Encounter for screening mammogram for malignant neoplasm of breast: Secondary | ICD-10-CM

## 2023-09-17 ENCOUNTER — Ambulatory Visit
Admission: RE | Admit: 2023-09-17 | Discharge: 2023-09-17 | Disposition: A | Discharge status: Home / Self Care | Payer: Medicare Other | Source: Ambulatory Visit | Attending: Family Medicine | Admitting: Family Medicine

## 2023-09-17 DIAGNOSIS — Z1231 Encounter for screening mammogram for malignant neoplasm of breast: Secondary | ICD-10-CM | POA: Diagnosis not present

## 2023-09-19 ENCOUNTER — Other Ambulatory Visit: Payer: Self-pay | Admitting: Family Medicine

## 2023-09-19 DIAGNOSIS — Z8673 Personal history of transient ischemic attack (TIA), and cerebral infarction without residual deficits: Secondary | ICD-10-CM | POA: Diagnosis not present

## 2023-09-19 DIAGNOSIS — E113299 Type 2 diabetes mellitus with mild nonproliferative diabetic retinopathy without macular edema, unspecified eye: Secondary | ICD-10-CM | POA: Diagnosis not present

## 2023-09-19 DIAGNOSIS — G9050 Complex regional pain syndrome I, unspecified: Secondary | ICD-10-CM | POA: Diagnosis not present

## 2023-09-19 DIAGNOSIS — E041 Nontoxic single thyroid nodule: Secondary | ICD-10-CM | POA: Diagnosis not present

## 2023-09-19 DIAGNOSIS — M8580 Other specified disorders of bone density and structure, unspecified site: Secondary | ICD-10-CM | POA: Diagnosis not present

## 2023-09-19 DIAGNOSIS — N1832 Chronic kidney disease, stage 3b: Secondary | ICD-10-CM | POA: Diagnosis not present

## 2023-09-19 DIAGNOSIS — Z0000 Encounter for general adult medical examination without abnormal findings: Secondary | ICD-10-CM | POA: Diagnosis not present

## 2023-09-19 DIAGNOSIS — I129 Hypertensive chronic kidney disease with stage 1 through stage 4 chronic kidney disease, or unspecified chronic kidney disease: Secondary | ICD-10-CM | POA: Diagnosis not present

## 2023-09-19 DIAGNOSIS — E782 Mixed hyperlipidemia: Secondary | ICD-10-CM | POA: Diagnosis not present

## 2023-09-19 DIAGNOSIS — E1122 Type 2 diabetes mellitus with diabetic chronic kidney disease: Secondary | ICD-10-CM | POA: Diagnosis not present

## 2023-10-11 DIAGNOSIS — N179 Acute kidney failure, unspecified: Secondary | ICD-10-CM | POA: Diagnosis not present

## 2023-10-22 DIAGNOSIS — M9905 Segmental and somatic dysfunction of pelvic region: Secondary | ICD-10-CM | POA: Diagnosis not present

## 2023-10-22 DIAGNOSIS — M9903 Segmental and somatic dysfunction of lumbar region: Secondary | ICD-10-CM | POA: Diagnosis not present

## 2023-10-22 DIAGNOSIS — M62830 Muscle spasm of back: Secondary | ICD-10-CM | POA: Diagnosis not present

## 2023-10-22 DIAGNOSIS — M9904 Segmental and somatic dysfunction of sacral region: Secondary | ICD-10-CM | POA: Diagnosis not present

## 2023-11-01 DIAGNOSIS — I129 Hypertensive chronic kidney disease with stage 1 through stage 4 chronic kidney disease, or unspecified chronic kidney disease: Secondary | ICD-10-CM | POA: Diagnosis not present

## 2023-11-01 DIAGNOSIS — N183 Chronic kidney disease, stage 3 unspecified: Secondary | ICD-10-CM | POA: Diagnosis not present

## 2023-11-05 DIAGNOSIS — M9903 Segmental and somatic dysfunction of lumbar region: Secondary | ICD-10-CM | POA: Diagnosis not present

## 2023-11-05 DIAGNOSIS — M9904 Segmental and somatic dysfunction of sacral region: Secondary | ICD-10-CM | POA: Diagnosis not present

## 2023-11-05 DIAGNOSIS — M62830 Muscle spasm of back: Secondary | ICD-10-CM | POA: Diagnosis not present

## 2023-11-05 DIAGNOSIS — M9905 Segmental and somatic dysfunction of pelvic region: Secondary | ICD-10-CM | POA: Diagnosis not present

## 2023-11-19 DIAGNOSIS — M9903 Segmental and somatic dysfunction of lumbar region: Secondary | ICD-10-CM | POA: Diagnosis not present

## 2023-11-19 DIAGNOSIS — M9905 Segmental and somatic dysfunction of pelvic region: Secondary | ICD-10-CM | POA: Diagnosis not present

## 2023-11-19 DIAGNOSIS — M9904 Segmental and somatic dysfunction of sacral region: Secondary | ICD-10-CM | POA: Diagnosis not present

## 2023-11-19 DIAGNOSIS — M62830 Muscle spasm of back: Secondary | ICD-10-CM | POA: Diagnosis not present

## 2023-12-03 DIAGNOSIS — M62830 Muscle spasm of back: Secondary | ICD-10-CM | POA: Diagnosis not present

## 2023-12-03 DIAGNOSIS — M9904 Segmental and somatic dysfunction of sacral region: Secondary | ICD-10-CM | POA: Diagnosis not present

## 2023-12-03 DIAGNOSIS — M9903 Segmental and somatic dysfunction of lumbar region: Secondary | ICD-10-CM | POA: Diagnosis not present

## 2023-12-03 DIAGNOSIS — M9905 Segmental and somatic dysfunction of pelvic region: Secondary | ICD-10-CM | POA: Diagnosis not present

## 2023-12-05 DIAGNOSIS — M9905 Segmental and somatic dysfunction of pelvic region: Secondary | ICD-10-CM | POA: Diagnosis not present

## 2023-12-05 DIAGNOSIS — M9903 Segmental and somatic dysfunction of lumbar region: Secondary | ICD-10-CM | POA: Diagnosis not present

## 2023-12-05 DIAGNOSIS — M9904 Segmental and somatic dysfunction of sacral region: Secondary | ICD-10-CM | POA: Diagnosis not present

## 2023-12-05 DIAGNOSIS — M62830 Muscle spasm of back: Secondary | ICD-10-CM | POA: Diagnosis not present

## 2023-12-10 DIAGNOSIS — M9904 Segmental and somatic dysfunction of sacral region: Secondary | ICD-10-CM | POA: Diagnosis not present

## 2023-12-10 DIAGNOSIS — M9903 Segmental and somatic dysfunction of lumbar region: Secondary | ICD-10-CM | POA: Diagnosis not present

## 2023-12-10 DIAGNOSIS — M62830 Muscle spasm of back: Secondary | ICD-10-CM | POA: Diagnosis not present

## 2023-12-10 DIAGNOSIS — M9905 Segmental and somatic dysfunction of pelvic region: Secondary | ICD-10-CM | POA: Diagnosis not present

## 2023-12-12 DIAGNOSIS — M62830 Muscle spasm of back: Secondary | ICD-10-CM | POA: Diagnosis not present

## 2023-12-12 DIAGNOSIS — M9904 Segmental and somatic dysfunction of sacral region: Secondary | ICD-10-CM | POA: Diagnosis not present

## 2023-12-12 DIAGNOSIS — M9903 Segmental and somatic dysfunction of lumbar region: Secondary | ICD-10-CM | POA: Diagnosis not present

## 2023-12-12 DIAGNOSIS — M9905 Segmental and somatic dysfunction of pelvic region: Secondary | ICD-10-CM | POA: Diagnosis not present

## 2023-12-13 ENCOUNTER — Other Ambulatory Visit: Payer: Self-pay

## 2023-12-13 ENCOUNTER — Emergency Department (HOSPITAL_COMMUNITY)

## 2023-12-13 ENCOUNTER — Encounter (HOSPITAL_COMMUNITY): Payer: Self-pay

## 2023-12-13 ENCOUNTER — Inpatient Hospital Stay (HOSPITAL_COMMUNITY)
Admission: EM | Admit: 2023-12-13 | Discharge: 2023-12-26 | DRG: 481 | Disposition: A | Discharge status: Skilled Nursing Facility | Attending: Family Medicine | Admitting: Family Medicine

## 2023-12-13 DIAGNOSIS — Z91013 Allergy to seafood: Secondary | ICD-10-CM | POA: Diagnosis not present

## 2023-12-13 DIAGNOSIS — Z85828 Personal history of other malignant neoplasm of skin: Secondary | ICD-10-CM

## 2023-12-13 DIAGNOSIS — W010XXA Fall on same level from slipping, tripping and stumbling without subsequent striking against object, initial encounter: Secondary | ICD-10-CM | POA: Diagnosis present

## 2023-12-13 DIAGNOSIS — M19011 Primary osteoarthritis, right shoulder: Secondary | ICD-10-CM | POA: Diagnosis not present

## 2023-12-13 DIAGNOSIS — I499 Cardiac arrhythmia, unspecified: Secondary | ICD-10-CM | POA: Diagnosis not present

## 2023-12-13 DIAGNOSIS — Z7984 Long term (current) use of oral hypoglycemic drugs: Secondary | ICD-10-CM

## 2023-12-13 DIAGNOSIS — Z6832 Body mass index (BMI) 32.0-32.9, adult: Secondary | ICD-10-CM

## 2023-12-13 DIAGNOSIS — R262 Difficulty in walking, not elsewhere classified: Secondary | ICD-10-CM | POA: Diagnosis not present

## 2023-12-13 DIAGNOSIS — R0902 Hypoxemia: Secondary | ICD-10-CM | POA: Diagnosis not present

## 2023-12-13 DIAGNOSIS — E119 Type 2 diabetes mellitus without complications: Secondary | ICD-10-CM | POA: Diagnosis not present

## 2023-12-13 DIAGNOSIS — S72002A Fracture of unspecified part of neck of left femur, initial encounter for closed fracture: Secondary | ICD-10-CM | POA: Diagnosis not present

## 2023-12-13 DIAGNOSIS — R339 Retention of urine, unspecified: Secondary | ICD-10-CM | POA: Diagnosis not present

## 2023-12-13 DIAGNOSIS — Z96651 Presence of right artificial knee joint: Secondary | ICD-10-CM | POA: Diagnosis not present

## 2023-12-13 DIAGNOSIS — Z96611 Presence of right artificial shoulder joint: Secondary | ICD-10-CM | POA: Diagnosis present

## 2023-12-13 DIAGNOSIS — E66811 Obesity, class 1: Secondary | ICD-10-CM | POA: Diagnosis present

## 2023-12-13 DIAGNOSIS — Z7902 Long term (current) use of antithrombotics/antiplatelets: Secondary | ICD-10-CM | POA: Diagnosis not present

## 2023-12-13 DIAGNOSIS — I129 Hypertensive chronic kidney disease with stage 1 through stage 4 chronic kidney disease, or unspecified chronic kidney disease: Secondary | ICD-10-CM | POA: Diagnosis present

## 2023-12-13 DIAGNOSIS — Y92019 Unspecified place in single-family (private) house as the place of occurrence of the external cause: Secondary | ICD-10-CM

## 2023-12-13 DIAGNOSIS — R338 Other retention of urine: Secondary | ICD-10-CM

## 2023-12-13 DIAGNOSIS — E785 Hyperlipidemia, unspecified: Secondary | ICD-10-CM | POA: Diagnosis present

## 2023-12-13 DIAGNOSIS — Z7982 Long term (current) use of aspirin: Secondary | ICD-10-CM | POA: Diagnosis not present

## 2023-12-13 DIAGNOSIS — S72142D Displaced intertrochanteric fracture of left femur, subsequent encounter for closed fracture with routine healing: Secondary | ICD-10-CM | POA: Diagnosis not present

## 2023-12-13 DIAGNOSIS — E1122 Type 2 diabetes mellitus with diabetic chronic kidney disease: Secondary | ICD-10-CM | POA: Diagnosis present

## 2023-12-13 DIAGNOSIS — R6889 Other general symptoms and signs: Secondary | ICD-10-CM | POA: Diagnosis not present

## 2023-12-13 DIAGNOSIS — R278 Other lack of coordination: Secondary | ICD-10-CM | POA: Diagnosis not present

## 2023-12-13 DIAGNOSIS — M25552 Pain in left hip: Secondary | ICD-10-CM | POA: Diagnosis present

## 2023-12-13 DIAGNOSIS — N1831 Chronic kidney disease, stage 3a: Secondary | ICD-10-CM | POA: Diagnosis not present

## 2023-12-13 DIAGNOSIS — E669 Obesity, unspecified: Secondary | ICD-10-CM | POA: Diagnosis present

## 2023-12-13 DIAGNOSIS — M85862 Other specified disorders of bone density and structure, left lower leg: Secondary | ICD-10-CM | POA: Diagnosis not present

## 2023-12-13 DIAGNOSIS — R600 Localized edema: Secondary | ICD-10-CM | POA: Diagnosis not present

## 2023-12-13 DIAGNOSIS — Z96652 Presence of left artificial knee joint: Secondary | ICD-10-CM | POA: Diagnosis not present

## 2023-12-13 DIAGNOSIS — S72142A Displaced intertrochanteric fracture of left femur, initial encounter for closed fracture: Secondary | ICD-10-CM | POA: Diagnosis not present

## 2023-12-13 DIAGNOSIS — S72352A Displaced comminuted fracture of shaft of left femur, initial encounter for closed fracture: Secondary | ICD-10-CM | POA: Diagnosis not present

## 2023-12-13 DIAGNOSIS — G8911 Acute pain due to trauma: Secondary | ICD-10-CM | POA: Diagnosis not present

## 2023-12-13 DIAGNOSIS — Z7401 Bed confinement status: Secondary | ICD-10-CM | POA: Diagnosis not present

## 2023-12-13 DIAGNOSIS — Z741 Need for assistance with personal care: Secondary | ICD-10-CM | POA: Diagnosis not present

## 2023-12-13 DIAGNOSIS — Z90710 Acquired absence of both cervix and uterus: Secondary | ICD-10-CM | POA: Diagnosis not present

## 2023-12-13 DIAGNOSIS — R0989 Other specified symptoms and signs involving the circulatory and respiratory systems: Secondary | ICD-10-CM | POA: Diagnosis not present

## 2023-12-13 DIAGNOSIS — Z01818 Encounter for other preprocedural examination: Secondary | ICD-10-CM | POA: Diagnosis not present

## 2023-12-13 DIAGNOSIS — Z8673 Personal history of transient ischemic attack (TIA), and cerebral infarction without residual deficits: Secondary | ICD-10-CM | POA: Diagnosis not present

## 2023-12-13 DIAGNOSIS — S2241XA Multiple fractures of ribs, right side, initial encounter for closed fracture: Secondary | ICD-10-CM | POA: Diagnosis not present

## 2023-12-13 DIAGNOSIS — Z743 Need for continuous supervision: Secondary | ICD-10-CM | POA: Diagnosis not present

## 2023-12-13 DIAGNOSIS — E1165 Type 2 diabetes mellitus with hyperglycemia: Secondary | ICD-10-CM | POA: Diagnosis not present

## 2023-12-13 DIAGNOSIS — H04129 Dry eye syndrome of unspecified lacrimal gland: Secondary | ICD-10-CM | POA: Diagnosis not present

## 2023-12-13 DIAGNOSIS — N1832 Chronic kidney disease, stage 3b: Secondary | ICD-10-CM | POA: Diagnosis present

## 2023-12-13 DIAGNOSIS — G90512 Complex regional pain syndrome I of left upper limb: Secondary | ICD-10-CM | POA: Diagnosis not present

## 2023-12-13 DIAGNOSIS — N179 Acute kidney failure, unspecified: Secondary | ICD-10-CM | POA: Diagnosis not present

## 2023-12-13 DIAGNOSIS — I2510 Atherosclerotic heart disease of native coronary artery without angina pectoris: Secondary | ICD-10-CM | POA: Diagnosis present

## 2023-12-13 DIAGNOSIS — Z96653 Presence of artificial knee joint, bilateral: Secondary | ICD-10-CM | POA: Diagnosis present

## 2023-12-13 DIAGNOSIS — Z96612 Presence of left artificial shoulder joint: Secondary | ICD-10-CM | POA: Diagnosis present

## 2023-12-13 DIAGNOSIS — I10 Essential (primary) hypertension: Secondary | ICD-10-CM | POA: Diagnosis not present

## 2023-12-13 DIAGNOSIS — Z751 Person awaiting admission to adequate facility elsewhere: Secondary | ICD-10-CM | POA: Diagnosis not present

## 2023-12-13 DIAGNOSIS — D62 Acute posthemorrhagic anemia: Secondary | ICD-10-CM | POA: Diagnosis not present

## 2023-12-13 DIAGNOSIS — M810 Age-related osteoporosis without current pathological fracture: Secondary | ICD-10-CM | POA: Diagnosis not present

## 2023-12-13 DIAGNOSIS — M79605 Pain in left leg: Secondary | ICD-10-CM | POA: Diagnosis not present

## 2023-12-13 DIAGNOSIS — W19XXXD Unspecified fall, subsequent encounter: Secondary | ICD-10-CM | POA: Diagnosis not present

## 2023-12-13 DIAGNOSIS — E1169 Type 2 diabetes mellitus with other specified complication: Secondary | ICD-10-CM | POA: Diagnosis not present

## 2023-12-13 DIAGNOSIS — M6259 Muscle wasting and atrophy, not elsewhere classified, multiple sites: Secondary | ICD-10-CM | POA: Diagnosis not present

## 2023-12-13 DIAGNOSIS — D649 Anemia, unspecified: Secondary | ICD-10-CM | POA: Diagnosis not present

## 2023-12-13 DIAGNOSIS — M25562 Pain in left knee: Secondary | ICD-10-CM | POA: Diagnosis not present

## 2023-12-13 LAB — BASIC METABOLIC PANEL
Anion gap: 13 (ref 5–15)
BUN: 32 mg/dL — ABNORMAL HIGH (ref 8–23)
CO2: 23 mmol/L (ref 22–32)
Calcium: 9.4 mg/dL (ref 8.9–10.3)
Chloride: 103 mmol/L (ref 98–111)
Creatinine, Ser: 1.33 mg/dL — ABNORMAL HIGH (ref 0.44–1.00)
GFR, Estimated: 41 mL/min — ABNORMAL LOW (ref >60)
Glucose, Bld: 138 mg/dL — ABNORMAL HIGH (ref 70–99)
Potassium: 4.1 mmol/L (ref 3.5–5.1)
Sodium: 139 mmol/L (ref 135–145)

## 2023-12-13 LAB — CBC
HCT: 37.6 % (ref 36.0–46.0)
Hemoglobin: 12.1 g/dL (ref 12.0–15.0)
MCH: 29.2 pg (ref 26.0–34.0)
MCHC: 32.2 g/dL (ref 30.0–36.0)
MCV: 90.6 fL (ref 80.0–100.0)
Platelets: 278 10*3/uL (ref 150–400)
RBC: 4.15 MIL/uL (ref 3.87–5.11)
RDW: 14.6 % (ref 11.5–15.5)
WBC: 13.2 10*3/uL — ABNORMAL HIGH (ref 4.0–10.5)
nRBC: 0 % (ref 0.0–0.2)

## 2023-12-13 LAB — GLUCOSE, CAPILLARY
Glucose-Capillary: 225 mg/dL — ABNORMAL HIGH (ref 70–99)
Glucose-Capillary: 231 mg/dL — ABNORMAL HIGH (ref 70–99)

## 2023-12-13 LAB — HEMOGLOBIN A1C
Hgb A1c MFr Bld: 6.7 % — ABNORMAL HIGH (ref 4.8–5.6)
Mean Plasma Glucose: 145.59 mg/dL

## 2023-12-13 MED ORDER — PRAVASTATIN SODIUM 20 MG PO TABS
40.0000 mg | ORAL_TABLET | Freq: Every day | ORAL | Status: DC
  Administered 2023-12-14 – 2023-12-25 (×12): 40 mg via ORAL
  Filled 2023-12-13: qty 2
  Filled 2023-12-13: qty 1
  Filled 2023-12-13 (×2): qty 2
  Filled 2023-12-13: qty 1
  Filled 2023-12-13: qty 2
  Filled 2023-12-13: qty 1
  Filled 2023-12-13 (×4): qty 2
  Filled 2023-12-13: qty 1

## 2023-12-13 MED ORDER — HEPARIN SODIUM (PORCINE) 5000 UNIT/ML IJ SOLN: 5000.0000 [IU] | Freq: Three times a day (TID) | INTRAMUSCULAR | Status: DC

## 2023-12-13 MED ORDER — AMLODIPINE BESYLATE 10 MG PO TABS
10.0000 mg | ORAL_TABLET | Freq: Every morning | ORAL | Status: DC
  Administered 2023-12-15 – 2023-12-26 (×11): 10 mg via ORAL
  Filled 2023-12-13 (×12): qty 1

## 2023-12-13 MED ORDER — MORPHINE SULFATE (PF) 4 MG/ML IV SOLN
2.0000 mg | Freq: Once | INTRAVENOUS | Status: AC
  Administered 2023-12-13: 2 mg via INTRAVENOUS
  Filled 2023-12-13: qty 1

## 2023-12-13 MED ORDER — POLYETHYLENE GLYCOL 3350 17 G PO PACK
17.0000 g | PACK | Freq: Every day | ORAL | Status: DC | PRN
  Administered 2023-12-16 – 2023-12-18 (×3): 17 g via ORAL
  Filled 2023-12-13 (×3): qty 1

## 2023-12-13 MED ORDER — HYDROCHLOROTHIAZIDE 12.5 MG PO TABS
12.5000 mg | ORAL_TABLET | Freq: Every morning | ORAL | Status: DC
  Administered 2023-12-15 – 2023-12-26 (×12): 12.5 mg via ORAL
  Filled 2023-12-13 (×12): qty 1

## 2023-12-13 MED ORDER — MUPIROCIN 2 % EX OINT
1.0000 | TOPICAL_OINTMENT | Freq: Two times a day (BID) | CUTANEOUS | Status: AC
  Administered 2023-12-14 – 2023-12-18 (×9): 1 via NASAL
  Filled 2023-12-13 (×2): qty 22

## 2023-12-13 MED ORDER — BISACODYL 10 MG RE SUPP
10.0000 mg | Freq: Every day | RECTAL | Status: DC | PRN
  Filled 2023-12-13: qty 1

## 2023-12-13 MED ORDER — B COMPLEX-C PO TABS
1.0000 | ORAL_TABLET | Freq: Every day | ORAL | Status: DC
  Administered 2023-12-15 – 2023-12-26 (×12): 1 via ORAL
  Filled 2023-12-13 (×12): qty 1

## 2023-12-13 MED ORDER — ONDANSETRON HCL 4 MG/2ML IJ SOLN
4.0000 mg | Freq: Once | INTRAMUSCULAR | Status: AC
  Administered 2023-12-13: 4 mg via INTRAVENOUS
  Filled 2023-12-13: qty 2

## 2023-12-13 MED ORDER — CARMEX CLASSIC LIP BALM EX OINT
1.0000 | TOPICAL_OINTMENT | CUTANEOUS | Status: DC | PRN
Start: 2023-12-13 — End: 2023-12-26
  Filled 2023-12-13: qty 10

## 2023-12-13 MED ORDER — OYSTER SHELL CALCIUM/D3 500-5 MG-MCG PO TABS
1.0000 | ORAL_TABLET | Freq: Every day | ORAL | Status: DC
  Administered 2023-12-15 – 2023-12-26 (×12): 1 via ORAL
  Filled 2023-12-13 (×12): qty 1

## 2023-12-13 MED ORDER — COLESEVELAM HCL 625 MG PO TABS
1250.0000 mg | ORAL_TABLET | Freq: Two times a day (BID) | ORAL | Status: DC
  Administered 2023-12-15 – 2023-12-26 (×23): 1250 mg via ORAL
  Filled 2023-12-13 (×28): qty 2

## 2023-12-13 MED ORDER — MORPHINE SULFATE (PF) 2 MG/ML IV SOLN
0.5000 mg | INTRAVENOUS | Status: DC | PRN
  Administered 2023-12-14: 0.5 mg via INTRAVENOUS
  Filled 2023-12-13 (×2): qty 1

## 2023-12-13 MED ORDER — INSULIN ASPART 100 UNIT/ML IJ SOLN
0.0000 [IU] | Freq: Three times a day (TID) | INTRAMUSCULAR | Status: DC
  Administered 2023-12-13: 5 [IU] via SUBCUTANEOUS
  Administered 2023-12-14: 3 [IU] via SUBCUTANEOUS
  Administered 2023-12-14: 2 [IU] via SUBCUTANEOUS
  Administered 2023-12-15 (×2): 5 [IU] via SUBCUTANEOUS
  Administered 2023-12-15 – 2023-12-16 (×2): 3 [IU] via SUBCUTANEOUS
  Administered 2023-12-16: 5 [IU] via SUBCUTANEOUS
  Administered 2023-12-16: 3 [IU] via SUBCUTANEOUS
  Administered 2023-12-17: 5 [IU] via SUBCUTANEOUS
  Administered 2023-12-17: 8 [IU] via SUBCUTANEOUS
  Administered 2023-12-17: 3 [IU] via SUBCUTANEOUS
  Administered 2023-12-18: 5 [IU] via SUBCUTANEOUS
  Administered 2023-12-18: 3 [IU] via SUBCUTANEOUS
  Administered 2023-12-18: 5 [IU] via SUBCUTANEOUS
  Administered 2023-12-19: 11 [IU] via SUBCUTANEOUS
  Administered 2023-12-19: 3 [IU] via SUBCUTANEOUS
  Administered 2023-12-19: 5 [IU] via SUBCUTANEOUS
  Administered 2023-12-20: 8 [IU] via SUBCUTANEOUS
  Administered 2023-12-20: 5 [IU] via SUBCUTANEOUS
  Administered 2023-12-20: 3 [IU] via SUBCUTANEOUS
  Administered 2023-12-21: 5 [IU] via SUBCUTANEOUS
  Administered 2023-12-21: 8 [IU] via SUBCUTANEOUS
  Administered 2023-12-21: 5 [IU] via SUBCUTANEOUS
  Administered 2023-12-22: 3 [IU] via SUBCUTANEOUS
  Administered 2023-12-22: 8 [IU] via SUBCUTANEOUS
  Administered 2023-12-22 – 2023-12-23 (×2): 5 [IU] via SUBCUTANEOUS
  Administered 2023-12-23 – 2023-12-24 (×3): 3 [IU] via SUBCUTANEOUS
  Administered 2023-12-24: 5 [IU] via SUBCUTANEOUS
  Administered 2023-12-24: 8 [IU] via SUBCUTANEOUS
  Administered 2023-12-25: 2 [IU] via SUBCUTANEOUS
  Administered 2023-12-25: 8 [IU] via SUBCUTANEOUS
  Administered 2023-12-25: 3 [IU] via SUBCUTANEOUS
  Administered 2023-12-26: 8 [IU] via SUBCUTANEOUS
  Administered 2023-12-26: 3 [IU] via SUBCUTANEOUS

## 2023-12-13 MED ORDER — INSULIN ASPART 100 UNIT/ML IJ SOLN
0.0000 [IU] | Freq: Every day | INTRAMUSCULAR | Status: DC
  Administered 2023-12-13: 2 [IU] via SUBCUTANEOUS
  Administered 2023-12-14: 4 [IU] via SUBCUTANEOUS
  Administered 2023-12-15: 3 [IU] via SUBCUTANEOUS
  Administered 2023-12-16: 2 [IU] via SUBCUTANEOUS
  Administered 2023-12-18 – 2023-12-20 (×3): 3 [IU] via SUBCUTANEOUS
  Administered 2023-12-23 – 2023-12-25 (×3): 2 [IU] via SUBCUTANEOUS

## 2023-12-13 MED ORDER — HYDROCODONE-ACETAMINOPHEN 5-325 MG PO TABS
1.0000 | ORAL_TABLET | Freq: Four times a day (QID) | ORAL | Status: DC | PRN
  Administered 2023-12-13 (×2): 2 via ORAL
  Filled 2023-12-13 (×2): qty 2

## 2023-12-13 MED ORDER — MORPHINE SULFATE (PF) 2 MG/ML IV SOLN
2.0000 mg | Freq: Once | INTRAVENOUS | Status: AC
  Administered 2023-12-13: 2 mg via INTRAVENOUS

## 2023-12-13 MED ORDER — ADULT MULTIVITAMIN W/MINERALS CH
1.0000 | ORAL_TABLET | Freq: Every morning | ORAL | Status: DC
  Administered 2023-12-15 – 2023-12-26 (×12): 1 via ORAL
  Filled 2023-12-13 (×12): qty 1

## 2023-12-13 NOTE — ED Triage Notes (Signed)
 Pt tripped over her own feet and fell.  PT c/o left hip pain and a bruise to the right forearm, forearm is not painful.

## 2023-12-13 NOTE — Progress Notes (Signed)
 Spoke with EDP at [facility] regarding L hip pain after a GLF at home. 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 Patient has comminuted L intertrochanteric femur fracture, requiring surgical treatment.  Plan for surgery tomorrow early afternoon at [facility]. OK to eat today, and then NPO after MN. Hold chemical DVT ppx. [facility] consult is pending for admission to [facility].  Will need [facility] transfer.

## 2023-12-13 NOTE — H&P (Signed)
 [facility] H&P   Patient Demographics:    Alexis Hopkins, is a 78 y.o. female  MRN: [id number]   DOB - 06-22-1946  Admit Date - 12/13/2023  Outpatient Primary MD for the patient is Lupita Raider, MD  Referring MD/NP/PA: Dr Rhae Hammock    Patient coming from: home  Chief Complaint  Patient presents with   Fall      HPI:    Alexis Hopkins  is a 78 y.o. female, past medical history of diabetes mellitus, hypertension, TIA on Plavix, patient presents to ED secondary to fall, with left hip pain, patient had a mechanical fall, earlier today, reports she lost her footing, fell down, hit her left hip, she denies any head trauma, any loss of consciousness, any dizziness or lightheadedness preceding the fall, forts pain has been significant, unable to bear any weight. -ED workup significant for left hip fracture, Dr. Linna Caprice orthopedic from [facility] reviewed imaging, workup significant for comminuted left intertrochanteric femur fracture, requiring surgical treatment, patient will be transferred to [facility], with plan for surgery tomorrow and early afternoon, [facility] hospitalist consulted to admit.    Review of systems:     A full 10 point Review of Systems was done, except as stated above, all other Review of Systems were negative.   With Past History of the following :    Past Medical History:  Diagnosis Date   Anemia    Arthritis    Basal cell carcinoma 01/08/2014   BCC NODULAR NOSE MOHS   Cancer (HCC)    SKIN CANCERS REMOVED   Diabetes mellitus without complication (HCC)    Type II   Family history of adverse reaction to anesthesia    "mother always was deathly sick"   History of kidney stones 40 YRS AGO   Hx of skin cancer, basal cell    Hyperlipemia    Hypertension    Osteoporosis    PONV (postoperative nausea and vomiting)    RSD (reflex sympathetic dystrophy)     L ARM   Squamous cell carcinoma of skin    ABOVE LEFT BROW CX3 5FU   TIA (transient ischemic attack) 1994   Takes Plavix      Past Surgical History:  Procedure Laterality Date   ABDOMINAL HYSTERECTOMY  10/02/1993   DILATION AND CURETTAGE OF UTERUS     KNEE ARTHROSCOPY     X 1 on right; X 2 on left   MOHS SURGERY     REVERSE SHOULDER ARTHROPLASTY Right 10/03/2013   Procedure: RIGHT REVERSE SHOULDER ARTHROPLASTY;  Surgeon: Verlee Rossetti, MD;  Location: [facility] OR;  Service: Orthopedics;  Laterality: Right;   REVERSE SHOULDER ARTHROPLASTY Left 07/21/2021   Procedure: REVERSE SHOULDER ARTHROPLASTY;  Surgeon: Beverely Low, MD;  Location: [facility] OR;  Service: Orthopedics;  Laterality: Left;   ROTATOR CUFF REPAIR Right    SHOULDER ARTHROSCOPY Right  TOTAL KNEE ARTHROPLASTY Right 10/05/2014   Procedure: RIGHT TOTAL KNEE ARTHROPLASTY;  Surgeon: Shelda Pal, MD;  Location: [facility] ORS;  Service: Orthopedics;  Laterality: Right;   TOTAL KNEE ARTHROPLASTY Left 03/09/2015   Procedure: LEFT TOTAL KNEE ARTHROPLASTY;  Surgeon: Durene Romans, MD;  Location: [facility] ORS;  Service: Orthopedics;  Laterality: Left;      Social History:     Social History   Tobacco Use   Smoking status: Never   Smokeless tobacco: Never  Substance Use Topics   Alcohol use: No       Family History :     Family History  Problem Relation Age of Onset   Breast cancer Neg Hx     Home Medications:   Prior to Admission medications   Medication Sig Start Date End Date Taking? Authorizing Provider  amLODipine (NORVASC) 10 MG tablet Take 10 mg by mouth every morning.    Yes [provider]  b complex vitamins capsule Take 1 capsule by mouth daily.   Yes [provider]  clopidogrel (PLAVIX) 75 MG tablet Take 75 mg by mouth daily with breakfast.   Yes [provider]  colesevelam (WELCHOL) 625 MG tablet Take 1,250 mg by mouth 2 (two) times daily with a meal.   Yes [provider]   glimepiride (AMARYL) 2 MG tablet Take 2 mg by mouth See admin instructions. Take 1/2 tablet twice daily.   Yes [provider]  hydrochlorothiazide (HYDRODIURIL) 25 MG tablet Take 12.5 mg by mouth every morning.   Yes [provider]  lisinopril (PRINIVIL,ZESTRIL) 40 MG tablet Take 40 mg by mouth every morning.    Yes [provider]  metFORMIN (GLUCOPHAGE) 1000 MG tablet Take 1,000 mg by mouth 2 (two) times daily with a meal.   Yes [provider]  methocarbamol (ROBAXIN) 500 MG tablet Take 1 tablet every 8 hours by oral route as needed for 10 days.   Yes [provider]  Multiple Vitamin (MULTIVITAMIN WITH MINERALS) TABS tablet Take 1 tablet by mouth every morning.    Yes [provider]  OVER THE COUNTER MEDICATION Place 1 application into both eyes at bedtime. Pure and gentle lubricant eye pad   Yes [provider]  Polyvinyl Alcohol-Povidone PF (REFRESH) 1.4-0.6 % SOLN Place 1 drop into both eyes daily as needed (Dry eye).   Yes [provider]  pravastatin (PRAVACHOL) 40 MG tablet Take 40 mg by mouth daily. 03/11/21  Yes [provider]  sodium chloride (OCEAN) 0.65 % SOLN nasal spray Place 1-2 sprays into both nostrils daily as needed (Dry nose).   Yes [provider]  amoxicillin (AMOXIL) 500 MG capsule Take 2,000 mg by mouth See admin instructions. Take 4 capsules (2000 mg) by mouth 1 hour prior to dental appointments Patient not taking: Reported on 12/13/2023 05/02/21   [provider]  aspirin 325 MG tablet Take 325 mg by mouth every morning.  Patient not taking: Reported on 12/13/2023    [provider]  calcium-vitamin D (OSCAL WITH D) 500-200 MG-UNIT per tablet Take 1 tablet by mouth every morning.     [provider]  cycloSPORINE (RESTASIS) 0.05 % ophthalmic emulsion 1 drop 2 (two) times daily. Patient not taking: Reported on 12/13/2023    [provider]  Misc  Natural Products (OSTEO BI-FLEX TRIPLE STRENGTH PO) Take 1 tablet by mouth 2 (two) times daily. Patient not taking: Reported on 12/13/2023    [provider]  Allergies:     Allergies  Allergen Reactions   Motrin [Ibuprofen] Other (See Comments)    Per MD-- does not want her taking.    Shellfish Allergy Hives    Shrimp.     Physical Exam:   Vitals  Blood pressure (!) 159/84, pulse 97, temperature 98.2 F (36.8 C), temperature source Oral, resp. rate 17, weight 79.4 kg, SpO2 92%.   1. General Elderly female, laying in bed, in mild discomfort due to pain  2. Normal affect and insight, Not Suicidal or Homicidal, Awake Alert, Oriented X 3.  3. No F.N deficits, ALL C.Nerves Intact, Strength 5/5 all 4 extremities, Sensation intact all 4 extremities, Plantars down going.  4. Ears and Eyes appear Normal, Conjunctivae clear, PERRLA. Moist Oral Mucosa.  5. Supple Neck, No JVD, No cervical lymphadenopathy appriciated, No Carotid Bruits.  6. Symmetrical Chest wall movement, Good air movement bilaterally, CTAB.  7. RRR, No Gallops, Rubs or Murmurs, No Parasternal Heave.  8. Positive Bowel Sounds, Abdomen Soft, No tenderness, No organomegaly appriciated,No rebound -guarding or rigidity.  9.  No Cyanosis, Normal Skin Turgor, No Skin Rash or Bruise.  10. Good muscle tone,  joints appear normal , no effusions, lower extremity shortened , Juliane motion limited due to pain   Data Review:    CBC Recent Labs  Lab 12/13/23 1455  WBC 13.2*  HGB 12.1  HCT 37.6  PLT 278  MCV 90.6  MCH 29.2  MCHC 32.2  RDW 14.6   ------------------------------------------------------------------------------------------------------------------  Chemistries  Recent Labs  Lab 12/13/23 1455  NA 139  K 4.1  CL 103  CO2 23  GLUCOSE 138*  BUN 32*  CREATININE 1.33*  CALCIUM 9.4    ------------------------------------------------------------------------------------------------------------------ CrCl cannot be calculated (Unknown ideal weight.). ------------------------------------------------------------------------------------------------------------------ No results for input(s): "TSH", "T4TOTAL", "T3FREE", "THYROIDAB" in the last 72 hours.  Invalid input(s): "FREET3"  Coagulation profile No results for input(s): "INR", "PROTIME" in the last 168 hours. ------------------------------------------------------------------------------------------------------------------- No results for input(s): "DDIMER" in the last 72 hours. -------------------------------------------------------------------------------------------------------------------  Cardiac Enzymes No results for input(s): "CKMB", "TROPONINI", "MYOGLOBIN" in the last 168 hours.  Invalid input(s): "CK" ------------------------------------------------------------------------------------------------------------------ No results found for: "BNP"   ---------------------------------------------------------------------------------------------------------------  Urinalysis    Component Value Date/Time   COLORURINE YELLOW 03/04/2015 1003   APPEARANCEUR CLEAR 03/04/2015 1003   LABSPEC 1.019 03/04/2015 1003   PHURINE 5.0 03/04/2015 1003   GLUCOSEU NEGATIVE 03/04/2015 1003   HGBUR TRACE (A) 03/04/2015 1003   BILIRUBINUR NEGATIVE 03/04/2015 1003   KETONESUR NEGATIVE 03/04/2015 1003   PROTEINUR NEGATIVE 03/04/2015 1003   UROBILINOGEN 0.2 03/04/2015 1003   NITRITE NEGATIVE 03/04/2015 1003   LEUKOCYTESUR SMALL (A) 03/04/2015 1003    ----------------------------------------------------------------------------------------------------------------   Imaging Results:    No results found.   EKG is pending   Assessment & Plan:    Principal Problem:   Closed left hip fracture (HCC) Active Problems:    Obese   Essential hypertension   Diabetes mellitus (HCC)   History of stroke   Left hip fracture -Secondary to mechanical fall -Admitted under orthopedic fracture protocol, continue with as needed pain medication, continue with as needed laxatives. -Plan for surgery tomorrow by Dr. Linna Caprice, will keep n.p.o. after midnight. -Plavix, resumed when cleared by orthopedic -DVT prophylaxis to be initiated after surgery, meanwhile on SCDs  Diabetes mellitus -Will hold oral agents and keep an insulin sliding scale  Hypertension -Continue with home medications, will add as needed hydralazine  History of TIA -Resume Plavix when cleared by orthopedic  CKD stage IIIb -Renal function at baseline   DVT Prophylaxis- SCDs , subcu heparin ordered for tomorrow evening after surgery  AM Labs Ordered, also please review Full Orders  Family Communication: Admission, patients condition and plan of care including tests being ordered have been discussed with the patient and son, Husband at bedsidewho indicate understanding and agree with the plan and Code Status.  Code Status full  Likely DC to  [facility]  Consults called: orthopedic Dr Dr. Linna Caprice  Admission status: Inpatient  Time spent in minutes : 60 minutes   Huey Bienenstock M.D on 12/13/2023 at 4:32 PM   [facility] - Office  [phone]

## 2023-12-13 NOTE — ED Provider Notes (Signed)
 Family has discussed the patient's case with Dr. Linna Caprice from [facility].  He says that he will accept to Alexis Hopkins if the patient needs to be admitted   Rondel Baton, MD 12/13/23 [phone]

## 2023-12-13 NOTE — ED Notes (Signed)
[facility] called to transport patient. Nurse aware.

## 2023-12-13 NOTE — ED Provider Notes (Signed)
  EMERGENCY DEPARTMENT AT [facility] Provider Note   CSN: [id number] Arrival date & time: 12/13/23  1437     History {Add pertinent medical, surgical, social history, OB history to HPI:1} Chief Complaint  Patient presents with   Alexis Hopkins is a 78 y.o. female.  78 year old female with a past medical history of coronary artery disease on Plavix as well as hypertension presenting to the emergency department today with left hip pain.  The patient had a mechanical fall earlier today.  She lost her footing and fell on her left hip.  She denies hitting her head or any loss of consciousness.  She has been having pain left hip since.  Denies any focal weakness, numbness, or tingling.  She was unable to bear weight and did have a deformity.  The patient's daughter works as a Engineer, civil (consulting) at [employer] and discussed the patient's case with Dr. Linna Caprice who did call and recommended admission to [facility] long [facility] if her hip was in fact broken and he will see the patient in consultation.  The patient denies any other injuries.   Fall       Home Medications Prior to Admission medications   Medication Sig Start Date End Date Taking? Authorizing Provider  amLODipine (NORVASC) 10 MG tablet Take 10 mg by mouth every morning.     [provider]  amoxicillin (AMOXIL) 500 MG capsule Take 2,000 mg by mouth See admin instructions. Take 4 capsules (2000 mg) by mouth 1 hour prior to dental appointments 05/02/21   [provider]  aspirin 325 MG tablet Take 325 mg by mouth every morning.     [provider]  b complex vitamins capsule Take 1 capsule by mouth daily.    [provider]  calcium-vitamin D (OSCAL WITH D) 500-200 MG-UNIT per tablet Take 1 tablet by mouth every morning.     [provider]  clopidogrel (PLAVIX) 75 MG tablet Take 75 mg by mouth daily with breakfast.    [provider]  clotrimazole  (LOTRIMIN) 1 % cream Apply 1 application topically 2 (two) times daily.    [provider]  Coenzyme Q10 (CO Q 10) 100 MG CAPS Take 100 mg by mouth daily.    [provider]  colesevelam (WELCHOL) 625 MG tablet Take 1,875 mg by mouth 2 (two) times daily with a meal.    [provider]  cycloSPORINE (RESTASIS) 0.05 % ophthalmic emulsion 1 drop 2 (two) times daily.    [provider]  glimepiride (AMARYL) 2 MG tablet Take 2 mg by mouth 2 (two) times daily.    [provider]  hydrochlorothiazide (HYDRODIURIL) 25 MG tablet Take 25 mg by mouth every morning.     [provider]  HYDROcodone-acetaminophen (NORCO) 7.5-325 MG tablet Take 1 tablet by mouth every 4 (four) hours as needed for moderate pain. 07/21/21   Beverely Low, MD  ketoconazole (NIZORAL) 2 % cream Apply 1 application topically daily.    [provider]  lisinopril (PRINIVIL,ZESTRIL) 40 MG tablet Take 40 mg by mouth every morning.     [provider]  metFORMIN (GLUCOPHAGE) 1000 MG tablet Take 1,000 mg by mouth 2 (two) times daily with a meal.    [provider]  Misc Natural Products (OSTEO BI-FLEX TRIPLE STRENGTH PO) Take 1 tablet by mouth 2 (two) times daily.    [provider]  Multiple Vitamin (MULTIVITAMIN WITH MINERALS) TABS tablet  Take 1 tablet by mouth every morning.     [provider]  OVER THE COUNTER MEDICATION Place 1 application into both eyes at bedtime. Pure and gentle lubricant eye pad    [provider]  polyethylene glycol (MIRALAX / GLYCOLAX) packet Take 17 g by mouth 2 (two) times daily. Patient taking differently: Take 17 g by mouth daily as needed for mild constipation or moderate constipation. 03/10/15   Lanney Gins, PA-C  Polyvinyl Alcohol-Povidone PF (REFRESH) 1.4-0.6 % SOLN Place 1 drop into both eyes daily as needed (Dry eye).    [provider]  pravastatin (PRAVACHOL) 40 MG tablet Take 40 mg  by mouth daily. 03/11/21   [provider]  predniSONE (DELTASONE) 20 MG tablet Take 1 tablet (20 mg total) by mouth daily with breakfast. 08/12/23   Claiborne Rigg, NP  sodium chloride (OCEAN) 0.65 % SOLN nasal spray Place 1-2 sprays into both nostrils daily as needed (Dry nose).    [provider]      Allergies    Motrin [ibuprofen] and Shellfish allergy    Review of Systems   Review of Systems  Musculoskeletal:        Left hip pain    Physical Exam Updated Vital Signs BP (!) 159/84   Pulse 97   Temp 98.2 F (36.8 C) (Oral)   Resp 17   Wt 79.4 kg   SpO2 92%   BMI 31.50 kg/m  Physical Exam Vitals and nursing note reviewed.   Gen: NAD Eyes: PERRL, EOMI HEENT: no oropharyngeal swelling Neck: trachea midline Resp: clear to auscultation bilaterally Card: RRR, no murmurs, rubs, or gallops Abd: nontender, nondistended Extremities: no calf tenderness, no edema, the patient has an obvious deformity to the left hip and the left leg is shortened and externally rotated, she is tender over the left proximal femur, no tenderness over the hip or ankle of the left leg is noted, compartments are soft Vascular: 2+ radial pulses bilaterally, 2+ DP pulses bilaterally Neuro: The patient has normal strength with ankle flexion extension, reports normal sensation throughout Skin: no rashes Psyc: acting appropriately   ED Results / Procedures / Treatments   Labs (all labs ordered are listed, but only abnormal results are displayed) Labs Reviewed  CBC - Abnormal; Notable for the following components:      Result Value   WBC 13.2 (*)    All other components within normal limits  BASIC METABOLIC PANEL - Abnormal; Notable for the following components:   Glucose, Bld 138 (*)    BUN 32 (*)    Creatinine, Ser 1.33 (*)    GFR, Estimated 41 (*)    All other components within normal limits    EKG None  Radiology No results found.  Procedures Procedures  {Document  cardiac monitor, telemetry assessment procedure when appropriate:1}  Medications Ordered in ED Medications  morphine (PF) 4 MG/ML injection 2 mg (has no administration in time range)  ondansetron (ZOFRAN) injection 4 mg (has no administration in time range)    ED Course/ Medical Decision Making/ A&P   {   Click here for ABCD2, HEART and other calculatorsREFRESH Note before signing :1}                              Medical Decision Making 78 year old female with past medical history of coronary artery disease on Plavix as well as hypertension presenting to the emergency department today  with left hip pain after a mechanical fall.  The patient denies any head trauma or syncope/presyncope.  I will further evaluate her here with basic labs as well as an x-ray to evaluate for fracture.  Will give her morphine for pain.  She will likely require admission.  The patient was found to have a comminuted fracture of the femoral neck on x-ray.  She remains neurovascularly intact.  A call is placed to hospitalist service for admission to [facility].  Risk Prescription drug management. Decision regarding hospitalization.   ***  {Document critical care time when appropriate:1} {Document review of labs and clinical decision tools ie heart score, Chads2Vasc2 etc:1}  {Document your independent review of radiology images, and any outside records:1} {Document your discussion with family members, caretakers, and with consultants:1} {Document social determinants of health affecting pt's care:1} {Document your decision making why or why not admission, treatments were needed:1} Final Clinical Impression(s) / ED Diagnoses Final diagnoses:  Closed fracture of left hip, initial encounter [facility])    Rx / DC Orders ED Discharge Orders     None

## 2023-12-13 NOTE — ED Notes (Signed)
 Pt's daughter called stating pt was to be transferred to [facility] where her granddaughter had [facility] Dr. Samson Frederic was accepting the pt and EMS brought pt to this facility. Daughter informed that EDP would be notified of situation. AC made aware as well.

## 2023-12-14 ENCOUNTER — Inpatient Hospital Stay (HOSPITAL_COMMUNITY)

## 2023-12-14 ENCOUNTER — Inpatient Hospital Stay: Admit: 2023-12-14 | Admitting: Orthopedic Surgery

## 2023-12-14 ENCOUNTER — Inpatient Hospital Stay (HOSPITAL_COMMUNITY): Admitting: Anesthesiology

## 2023-12-14 ENCOUNTER — Encounter (HOSPITAL_COMMUNITY): Payer: Self-pay | Admitting: Internal Medicine

## 2023-12-14 ENCOUNTER — Encounter (HOSPITAL_COMMUNITY)
Admission: EM | Disposition: A | Discharge status: Skilled Nursing Facility | Payer: Self-pay | Source: Home / Self Care | Attending: Internal Medicine

## 2023-12-14 ENCOUNTER — Other Ambulatory Visit: Payer: Self-pay

## 2023-12-14 DIAGNOSIS — I10 Essential (primary) hypertension: Secondary | ICD-10-CM

## 2023-12-14 DIAGNOSIS — S72002A Fracture of unspecified part of neck of left femur, initial encounter for closed fracture: Secondary | ICD-10-CM

## 2023-12-14 DIAGNOSIS — E1122 Type 2 diabetes mellitus with diabetic chronic kidney disease: Secondary | ICD-10-CM | POA: Diagnosis not present

## 2023-12-14 DIAGNOSIS — N1832 Chronic kidney disease, stage 3b: Secondary | ICD-10-CM | POA: Diagnosis not present

## 2023-12-14 DIAGNOSIS — M19011 Primary osteoarthritis, right shoulder: Secondary | ICD-10-CM

## 2023-12-14 HISTORY — PX: FEMUR IM NAIL: SHX1597

## 2023-12-14 LAB — BASIC METABOLIC PANEL
Anion gap: 12 (ref 5–15)
BUN: 39 mg/dL — ABNORMAL HIGH (ref 8–23)
CO2: 22 mmol/L (ref 22–32)
Calcium: 8.9 mg/dL (ref 8.9–10.3)
Chloride: 102 mmol/L (ref 98–111)
Creatinine, Ser: 1.78 mg/dL — ABNORMAL HIGH (ref 0.44–1.00)
GFR, Estimated: 29 mL/min — ABNORMAL LOW (ref >60)
Glucose, Bld: 141 mg/dL — ABNORMAL HIGH (ref 70–99)
Potassium: 4.4 mmol/L (ref 3.5–5.1)
Sodium: 136 mmol/L (ref 135–145)

## 2023-12-14 LAB — CBC
HCT: 35.5 % — ABNORMAL LOW (ref 36.0–46.0)
Hemoglobin: 10.8 g/dL — ABNORMAL LOW (ref 12.0–15.0)
MCH: 28.5 pg (ref 26.0–34.0)
MCHC: 30.4 g/dL (ref 30.0–36.0)
MCV: 93.7 fL (ref 80.0–100.0)
Platelets: 260 10*3/uL (ref 150–400)
RBC: 3.79 MIL/uL — ABNORMAL LOW (ref 3.87–5.11)
RDW: 15.1 % (ref 11.5–15.5)
WBC: 9.9 10*3/uL (ref 4.0–10.5)
nRBC: 0 % (ref 0.0–0.2)

## 2023-12-14 LAB — GLUCOSE, CAPILLARY
Glucose-Capillary: 134 mg/dL — ABNORMAL HIGH (ref 70–99)
Glucose-Capillary: 104 mg/dL — ABNORMAL HIGH (ref 70–99)
Glucose-Capillary: 193 mg/dL — ABNORMAL HIGH (ref 70–99)
Glucose-Capillary: 197 mg/dL — ABNORMAL HIGH (ref 70–99)
Glucose-Capillary: 346 mg/dL — ABNORMAL HIGH (ref 70–99)

## 2023-12-14 LAB — SURGICAL PCR SCREEN
MRSA, PCR: NEGATIVE
Staphylococcus aureus: NEGATIVE

## 2023-12-14 SURGERY — INSERTION, INTRAMEDULLARY ROD, FEMUR
Anesthesia: General | Site: Hip | Laterality: Left

## 2023-12-14 MED ORDER — CHLORHEXIDINE GLUCONATE 4 % EX SOLN: 60.0000 mL | Freq: Once | CUTANEOUS | Status: DC

## 2023-12-14 MED ORDER — MENTHOL 3 MG MT LOZG: 1.0000 | LOZENGE | OROMUCOSAL | Status: DC | PRN

## 2023-12-14 MED ORDER — SUGAMMADEX SODIUM 200 MG/2ML IV SOLN
INTRAVENOUS | Status: DC | PRN
  Administered 2023-12-14: 200 mg via INTRAVENOUS

## 2023-12-14 MED ORDER — ONDANSETRON HCL 4 MG/2ML IJ SOLN
INTRAMUSCULAR | Status: AC
  Filled 2023-12-14: qty 4

## 2023-12-14 MED ORDER — ARTIFICIAL TEARS OPHTHALMIC OINT
TOPICAL_OINTMENT | OPHTHALMIC | Status: AC
  Filled 2023-12-14: qty 3.5

## 2023-12-14 MED ORDER — CHLORHEXIDINE GLUCONATE 0.12 % MT SOLN
15.0000 mL | Freq: Once | OROMUCOSAL | Status: AC
  Administered 2023-12-14: 15 mL via OROMUCOSAL

## 2023-12-14 MED ORDER — ASPIRIN 81 MG PO CHEW: 81.0000 mg | CHEWABLE_TABLET | Freq: Two times a day (BID) | ORAL | 0 refills | Status: DC

## 2023-12-14 MED ORDER — METHOCARBAMOL 500 MG PO TABS
500.0000 mg | ORAL_TABLET | Freq: Four times a day (QID) | ORAL | Status: DC | PRN
  Administered 2023-12-14 – 2023-12-26 (×19): 500 mg via ORAL
  Filled 2023-12-14 (×21): qty 1

## 2023-12-14 MED ORDER — PHENYLEPHRINE HCL-NACL 20-0.9 MG/250ML-% IV SOLN
INTRAVENOUS | Status: AC
  Filled 2023-12-14: qty 250

## 2023-12-14 MED ORDER — ALBUMIN HUMAN 5 % IV SOLN
INTRAVENOUS | Status: AC
  Filled 2023-12-14: qty 250

## 2023-12-14 MED ORDER — TRANEXAMIC ACID-NACL 1000-0.7 MG/100ML-% IV SOLN
1000.0000 mg | INTRAVENOUS | Status: AC
  Administered 2023-12-14: 1000 mg via INTRAVENOUS
  Filled 2023-12-14: qty 100

## 2023-12-14 MED ORDER — ALBUMIN HUMAN 5 % IV SOLN
12.5000 g | Freq: Once | INTRAVENOUS | Status: AC
  Administered 2023-12-14: 12.5 g via INTRAVENOUS

## 2023-12-14 MED ORDER — ONDANSETRON HCL 4 MG/2ML IJ SOLN
INTRAMUSCULAR | Status: DC | PRN
  Administered 2023-12-14: 4 mg via INTRAVENOUS

## 2023-12-14 MED ORDER — GLUCERNA SHAKE PO LIQD
237.0000 mL | Freq: Two times a day (BID) | ORAL | Status: DC
  Administered 2023-12-15 – 2023-12-26 (×16): 237 mL via ORAL
  Filled 2023-12-14 (×28): qty 237

## 2023-12-14 MED ORDER — MIDAZOLAM HCL 2 MG/2ML IJ SOLN
INTRAMUSCULAR | Status: AC
  Filled 2023-12-14: qty 2

## 2023-12-14 MED ORDER — SUGAMMADEX SODIUM 200 MG/2ML IV SOLN
INTRAVENOUS | Status: AC
  Filled 2023-12-14: qty 2

## 2023-12-14 MED ORDER — PROPOFOL 10 MG/ML IV BOLUS
INTRAVENOUS | Status: AC
  Filled 2023-12-14: qty 20

## 2023-12-14 MED ORDER — ONDANSETRON HCL 4 MG PO TABS: 4.0000 mg | ORAL_TABLET | Freq: Four times a day (QID) | ORAL | Status: DC | PRN

## 2023-12-14 MED ORDER — FENTANYL CITRATE (PF) 250 MCG/5ML IJ SOLN
INTRAMUSCULAR | Status: DC | PRN
  Administered 2023-12-14 (×10): 25 ug via INTRAVENOUS

## 2023-12-14 MED ORDER — ISOPROPYL ALCOHOL 70 % SOLN
Status: AC
  Filled 2023-12-14: qty 480

## 2023-12-14 MED ORDER — HYDROCODONE-ACETAMINOPHEN 5-325 MG PO TABS
1.0000 | ORAL_TABLET | ORAL | 0 refills | Status: AC | PRN
Start: 2023-12-14 — End: 2023-12-21

## 2023-12-14 MED ORDER — DEXAMETHASONE SODIUM PHOSPHATE 10 MG/ML IJ SOLN
INTRAMUSCULAR | Status: DC | PRN
  Administered 2023-12-14: 5 mg via INTRAVENOUS

## 2023-12-14 MED ORDER — PHENOL 1.4 % MT LIQD: 1.0000 | OROMUCOSAL | Status: DC | PRN

## 2023-12-14 MED ORDER — LACTATED RINGERS IV SOLN: INTRAVENOUS | Status: DC

## 2023-12-14 MED ORDER — ROCURONIUM BROMIDE 10 MG/ML (PF) SYRINGE
PREFILLED_SYRINGE | INTRAVENOUS | Status: DC | PRN
  Administered 2023-12-14: 10 mg via INTRAVENOUS
  Administered 2023-12-14: 50 mg via INTRAVENOUS

## 2023-12-14 MED ORDER — LIDOCAINE HCL (CARDIAC) PF 100 MG/5ML IV SOSY
PREFILLED_SYRINGE | INTRAVENOUS | Status: DC | PRN
Start: 2023-12-14 — End: 2023-12-14
  Administered 2023-12-14: 100 mg via INTRAVENOUS

## 2023-12-14 MED ORDER — ACETAMINOPHEN 10 MG/ML IV SOLN: 1000.0000 mg | Freq: Once | INTRAVENOUS | Status: DC | PRN

## 2023-12-14 MED ORDER — OXYCODONE HCL 5 MG PO TABS: 5.0000 mg | ORAL_TABLET | Freq: Once | ORAL | Status: DC | PRN

## 2023-12-14 MED ORDER — OXYCODONE HCL 5 MG/5ML PO SOLN: 5.0000 mg | Freq: Once | ORAL | Status: DC | PRN

## 2023-12-14 MED ORDER — ALBUMIN HUMAN 5 % IV SOLN
INTRAVENOUS | Status: AC
  Filled 2023-12-14: qty 500

## 2023-12-14 MED ORDER — FENTANYL CITRATE (PF) 250 MCG/5ML IJ SOLN
INTRAMUSCULAR | Status: AC
  Filled 2023-12-14: qty 5

## 2023-12-14 MED ORDER — PHENYLEPHRINE 80 MCG/ML (10ML) SYRINGE FOR IV PUSH (FOR BLOOD PRESSURE SUPPORT)
PREFILLED_SYRINGE | INTRAVENOUS | Status: AC
  Filled 2023-12-14: qty 20

## 2023-12-14 MED ORDER — ACETAMINOPHEN 10 MG/ML IV SOLN
INTRAVENOUS | Status: AC
  Filled 2023-12-14: qty 100

## 2023-12-14 MED ORDER — ASPIRIN 325 MG PO TBEC
325.0000 mg | DELAYED_RELEASE_TABLET | Freq: Every day | ORAL | Status: DC
  Administered 2023-12-14 – 2023-12-22 (×9): 325 mg via ORAL
  Filled 2023-12-14 (×9): qty 1

## 2023-12-14 MED ORDER — MORPHINE SULFATE (PF) 2 MG/ML IV SOLN
0.5000 mg | INTRAVENOUS | Status: DC | PRN
  Filled 2023-12-14: qty 1

## 2023-12-14 MED ORDER — SODIUM CHLORIDE 0.9 % IR SOLN
Status: DC | PRN
  Administered 2023-12-14: 1000 mL

## 2023-12-14 MED ORDER — HYDROCODONE-ACETAMINOPHEN 7.5-325 MG PO TABS
1.0000 | ORAL_TABLET | ORAL | Status: DC | PRN
  Administered 2023-12-16 – 2023-12-18 (×5): 2 via ORAL
  Filled 2023-12-14 (×6): qty 2

## 2023-12-14 MED ORDER — CEFAZOLIN SODIUM-DEXTROSE 2-4 GM/100ML-% IV SOLN
2.0000 g | Freq: Four times a day (QID) | INTRAVENOUS | Status: AC
  Administered 2023-12-14 – 2023-12-15 (×2): 2 g via INTRAVENOUS
  Filled 2023-12-14 (×2): qty 100

## 2023-12-14 MED ORDER — ALBUMIN HUMAN 5 % IV SOLN
INTRAVENOUS | Status: DC | PRN
Start: 2023-12-14 — End: 2023-12-14

## 2023-12-14 MED ORDER — CEFAZOLIN SODIUM-DEXTROSE 2-4 GM/100ML-% IV SOLN
2.0000 g | INTRAVENOUS | Status: AC
  Administered 2023-12-14: 2 g via INTRAVENOUS

## 2023-12-14 MED ORDER — STERILE WATER FOR IRRIGATION IR SOLN
Status: DC | PRN
  Administered 2023-12-14: 1000 mL

## 2023-12-14 MED ORDER — PROPOFOL 10 MG/ML IV BOLUS
INTRAVENOUS | Status: DC | PRN
  Administered 2023-12-14: 100 mg via INTRAVENOUS

## 2023-12-14 MED ORDER — MIDAZOLAM HCL 2 MG/2ML IJ SOLN
INTRAMUSCULAR | Status: DC | PRN
  Administered 2023-12-14: 1 mg via INTRAVENOUS

## 2023-12-14 MED ORDER — CLOPIDOGREL BISULFATE 75 MG PO TABS
75.0000 mg | ORAL_TABLET | Freq: Every day | ORAL | Status: DC
  Administered 2023-12-15 – 2023-12-23 (×9): 75 mg via ORAL
  Filled 2023-12-14 (×11): qty 1

## 2023-12-14 MED ORDER — POVIDONE-IODINE 10 % EX SWAB: 2.0000 | Freq: Once | CUTANEOUS | Status: DC

## 2023-12-14 MED ORDER — LIDOCAINE HCL (PF) 2 % IJ SOLN
INTRAMUSCULAR | Status: AC
  Filled 2023-12-14: qty 10

## 2023-12-14 MED ORDER — ISOPROPYL ALCOHOL 70 % SOLN
Status: DC | PRN
  Administered 2023-12-14: 1 via TOPICAL

## 2023-12-14 MED ORDER — ONDANSETRON HCL 4 MG/2ML IJ SOLN: 4.0000 mg | Freq: Four times a day (QID) | INTRAMUSCULAR | Status: DC | PRN

## 2023-12-14 MED ORDER — EPHEDRINE 5 MG/ML INJ
INTRAVENOUS | Status: AC
  Filled 2023-12-14: qty 5

## 2023-12-14 MED ORDER — ROCURONIUM BROMIDE 10 MG/ML (PF) SYRINGE
PREFILLED_SYRINGE | INTRAVENOUS | Status: AC
  Filled 2023-12-14: qty 20

## 2023-12-14 MED ORDER — ACETAMINOPHEN 10 MG/ML IV SOLN
INTRAVENOUS | Status: DC | PRN
  Administered 2023-12-14: 1000 mg via INTRAVENOUS

## 2023-12-14 MED ORDER — DOCUSATE SODIUM 100 MG PO CAPS
100.0000 mg | ORAL_CAPSULE | Freq: Two times a day (BID) | ORAL | Status: DC
  Administered 2023-12-14 – 2023-12-26 (×22): 100 mg via ORAL
  Filled 2023-12-14 (×25): qty 1

## 2023-12-14 MED ORDER — CHLORHEXIDINE GLUCONATE CLOTH 2 % EX PADS
6.0000 | MEDICATED_PAD | Freq: Every day | CUTANEOUS | Status: DC
  Administered 2023-12-14 – 2023-12-26 (×12): 6 via TOPICAL

## 2023-12-14 MED ORDER — HYDROCODONE-ACETAMINOPHEN 5-325 MG PO TABS
1.0000 | ORAL_TABLET | ORAL | Status: DC | PRN
  Administered 2023-12-14 – 2023-12-23 (×24): 2 via ORAL
  Administered 2023-12-24: 1 via ORAL
  Administered 2023-12-24 (×2): 2 via ORAL
  Administered 2023-12-25: 1 via ORAL
  Administered 2023-12-25 (×2): 2 via ORAL
  Administered 2023-12-26: 1 via ORAL
  Filled 2023-12-14: qty 2
  Filled 2023-12-14: qty 1
  Filled 2023-12-14 (×2): qty 2
  Filled 2023-12-14: qty 1
  Filled 2023-12-14 (×22): qty 2
  Filled 2023-12-14: qty 1
  Filled 2023-12-14 (×5): qty 2
  Filled 2023-12-14: qty 1
  Filled 2023-12-14: qty 2

## 2023-12-14 MED ORDER — FENTANYL CITRATE PF 50 MCG/ML IJ SOSY
PREFILLED_SYRINGE | INTRAMUSCULAR | Status: AC
  Filled 2023-12-14: qty 2

## 2023-12-14 MED ORDER — FENTANYL CITRATE PF 50 MCG/ML IJ SOSY
25.0000 ug | PREFILLED_SYRINGE | INTRAMUSCULAR | Status: DC | PRN
  Administered 2023-12-14: 25 ug via INTRAVENOUS
  Administered 2023-12-14: 50 ug via INTRAVENOUS
  Administered 2023-12-14: 25 ug via INTRAVENOUS

## 2023-12-14 MED ORDER — METOCLOPRAMIDE HCL 10 MG PO TABS: 5.0000 mg | ORAL_TABLET | Freq: Three times a day (TID) | ORAL | Status: DC | PRN

## 2023-12-14 MED ORDER — CEFAZOLIN SODIUM-DEXTROSE 2-4 GM/100ML-% IV SOLN
INTRAVENOUS | Status: AC
  Filled 2023-12-14: qty 100

## 2023-12-14 MED ORDER — METOCLOPRAMIDE HCL 5 MG/ML IJ SOLN: 5.0000 mg | Freq: Three times a day (TID) | INTRAMUSCULAR | Status: DC | PRN

## 2023-12-14 MED ORDER — ONDANSETRON HCL 4 MG/2ML IJ SOLN: 4.0000 mg | Freq: Once | INTRAMUSCULAR | Status: DC | PRN

## 2023-12-14 MED ORDER — METHOCARBAMOL 1000 MG/10ML IJ SOLN
500.0000 mg | Freq: Four times a day (QID) | INTRAMUSCULAR | Status: DC | PRN
  Administered 2023-12-17: 500 mg via INTRAVENOUS
  Filled 2023-12-14: qty 10

## 2023-12-14 MED ORDER — ACETAMINOPHEN 325 MG PO TABS: 325.0000 mg | ORAL_TABLET | Freq: Four times a day (QID) | ORAL | Status: DC | PRN

## 2023-12-14 MED ORDER — POVIDONE-IODINE 10 % EX SWAB
2.0000 | Freq: Once | CUTANEOUS | Status: DC
Start: 2023-12-14 — End: 2023-12-14

## 2023-12-14 MED ORDER — PHENYLEPHRINE HCL-NACL 20-0.9 MG/250ML-% IV SOLN
INTRAVENOUS | Status: DC | PRN
  Administered 2023-12-14: 25 ug/min via INTRAVENOUS

## 2023-12-14 MED ORDER — DEXAMETHASONE SODIUM PHOSPHATE 10 MG/ML IJ SOLN
INTRAMUSCULAR | Status: AC
  Filled 2023-12-14: qty 1

## 2023-12-14 SURGICAL SUPPLY — 42 items
BAG COUNTER SPONGE SURGICOUNT (BAG) IMPLANT
BAG ZIPLOCK 12X15 (MISCELLANEOUS) IMPLANT
BIT DRILL 4.3MMS DISTAL GRDTED (BIT) IMPLANT
CHLORAPREP W/TINT 26 (MISCELLANEOUS) ×2 IMPLANT
COVER PERINEAL POST (MISCELLANEOUS) ×2 IMPLANT
COVER SURGICAL LIGHT HANDLE (MISCELLANEOUS) ×2 IMPLANT
DERMABOND ADVANCED .7 DNX12 (GAUZE/BANDAGES/DRESSINGS) ×2 IMPLANT
DRAPE C-ARM 42X120 X-RAY (DRAPES) ×2 IMPLANT
DRAPE C-ARMOR (DRAPES) ×2 IMPLANT
DRAPE SHEET LG 3/4 BI-LAMINATE (DRAPES) ×2 IMPLANT
DRAPE STERI IOBAN 125X83 (DRAPES) ×2 IMPLANT
DRAPE U-SHAPE 47X51 STRL (DRAPES) ×4 IMPLANT
DRILL 4.3MMS DISTAL GRADUATED (BIT) ×1 IMPLANT
DRSG AQUACEL AG ADV 3.5X 4 (GAUZE/BANDAGES/DRESSINGS) IMPLANT
DRSG AQUACEL AG ADV 3.5X 6 (GAUZE/BANDAGES/DRESSINGS) ×2 IMPLANT
DRSG AQUACEL AG ADV 3.5X10 (GAUZE/BANDAGES/DRESSINGS) ×2 IMPLANT
GAUZE SPONGE 4X4 12PLY STRL (GAUZE/BANDAGES/DRESSINGS) ×2 IMPLANT
GLOVE BIO SURGEON STRL SZ7 (GLOVE) ×2 IMPLANT
GLOVE BIO SURGEON STRL SZ8.5 (GLOVE) ×4 IMPLANT
GLOVE BIOGEL PI IND STRL 7.5 (GLOVE) ×2 IMPLANT
GLOVE BIOGEL PI IND STRL 8.5 (GLOVE) ×2 IMPLANT
GOWN SPEC L3 XXLG W/TWL (GOWN DISPOSABLE) ×2 IMPLANT
GOWN STRL REUS W/ TWL XL LVL3 (GOWN DISPOSABLE) ×2 IMPLANT
GUIDEPIN VERSANAIL DSP 3.2X444 (ORTHOPEDIC DISPOSABLE SUPPLIES) IMPLANT
GUIDEWIRE BALL NOSE 100CM (WIRE) IMPLANT
HOOD PEEL AWAY T7 (MISCELLANEOUS) ×6 IMPLANT
KIT BASIN OR (CUSTOM PROCEDURE TRAY) ×2 IMPLANT
KIT TURNOVER KIT A (KITS) IMPLANT
MANIFOLD NEPTUNE II (INSTRUMENTS) ×2 IMPLANT
MARKER SKIN DUAL TIP RULER LAB (MISCELLANEOUS) ×2 IMPLANT
NAIL IM AFFIXUS 9X380 125D LT (Nail) IMPLANT
PACK GENERAL/GYN (CUSTOM PROCEDURE TRAY) ×2 IMPLANT
SCREW BONE CORTICAL 5.0X40 (Screw) IMPLANT
SCREW LAG HIP NAIL 10.5X95 (Screw) IMPLANT
STAPLER SKIN PROX 35W (STAPLE) IMPLANT
SUT MNCRL AB 3-0 PS2 18 (SUTURE) IMPLANT
SUT MON AB 2-0 CT1 36 (SUTURE) ×2 IMPLANT
SUT VIC AB 1 CT1 36 (SUTURE) ×2 IMPLANT
TOWEL GREEN STERILE FF (TOWEL DISPOSABLE) ×2 IMPLANT
TOWEL OR 17X26 10 PK STRL BLUE (TOWEL DISPOSABLE) ×2 IMPLANT
TRAY FOLEY MTR SLVR 14FR STAT (SET/KITS/TRAYS/PACK) ×2 IMPLANT
YANKAUER SUCT BULB TIP NO VENT (SUCTIONS) IMPLANT

## 2023-12-14 NOTE — Op Note (Addendum)
 OPERATIVE REPORT  SURGEON: Samson Frederic, MD   ASSISTANT: Clint Bolder, PA-C.  PREOPERATIVE DIAGNOSIS: Comminuted Left reverse obliquity intertrochanteric femur fracture.   POSTOPERATIVE DIAGNOSIS: Comminuted Left reverse obliquity intertrochanteric femur fracture  PROCEDURE: Intramedullary fixation, Left femur.   IMPLANTS: Biomet Affixus Hip Fracture Nail, 9 by 380 mm, 125 degrees. 10.5 x 95 mm Hip Fracture Nail Lag Screw. 5 x 40 mm distal interlocking screw 1.  ANESTHESIA:  General  ESTIMATED BLOOD LOSS:-250 mL    ANTIBIOTICS: 2 g Ancef.  DRAINS: None.  COMPLICATIONS: None.   CONDITION: PACU - hemodynamically stable.Marland Kitchen   BRIEF CLINICAL NOTE: Alexis Hopkins is a 78 y.o. female who presented with an intertrochanteric femur fracture. The patient was admitted to the hospitalist service and underwent perioperative risk stratification and medical optimization. The risks, benefits, and alternatives to the procedure were explained, and the patient elected to proceed.  PROCEDURE IN DETAIL: Surgical site was marked by myself. The patient was taken to the operating room and anesthesia was induced on the bed. The patient was then transferred to the [facility] table and the nonoperative lower extremity was scissored underneath the operative side. The fracture was reduced with traction, internal rotation, and adduction. The hip was prepped and draped in the normal sterile surgical fashion. Timeout was called verifying side and site of surgery. Preop antibiotics were given with 60 minutes of beginning the procedure.  Fluoroscopy was used to define the patient's anatomy. A 4 cm incision was made just proximal to the tip of the greater trochanter. The awl was used to obtain the standard starting point for a trochanteric entry nail under fluoroscopic control. The guidepin was placed. The entry reamer was used to open the proximal femur.  I placed the guidewire to the level of the physeal scar of the  knee. I measured the length of the guidewire. Sequential reaming was performed up to a size 10.5 mm with excellent chatter. Therefore, a size 9 by 380 mm nail was selected and assembled to the jig on the back table. The nail was placed without any difficulty. Through a separate stab incision, the cannula was placed down to the bone in preparation for the cephalomedullary device. A guidepin was placed into the femoral head using AP and lateral fluoroscopy views. The pin was measured, and then reaming was performed to the appropriate depth. The lag screw was inserted to the appropriate depth. The fracture was compressed through the jig. The setscrew was tightened statically. Using perfect circle technique, a distal interlocking screw was placed. The jig was removed. Final AP and lateral fluoroscopy views were obtained to confirm fracture reduction and hardware placement. Tip apex distance was appropriate. There was no chondral penetration.  The wounds were copiously irrigated with saline. The wound was closed in layers with #1 Vicryl for the fascia, 2-0 Monocryl for the deep dermal layer, and staples + Dermabond for the skin. Once the glue was fully hardened, sterile dressing was applied. The patient was then awakened from anesthesia and taken to the PACU in stable condition. Sponge needle and instrument counts were correct at the end of the case 2. There were no known complications.  We will readmit the patient to the hospitalist. Weightbearing status will be weightbearing as tolerated with a walker. We will begin ASA for DVT prophylaxis. The patient will work with physical therapy and undergo disposition planning.  Please note that a surgical assistant was a medical necessity for this procedure to perform it in a safe and expeditious  manner. Assistant was necessary to provide appropriate retraction of vital neurovascular structures, to prevent femoral fracture, and to allow for anatomic placement of the  prosthesis.

## 2023-12-14 NOTE — Interval H&P Note (Signed)
 History and Physical Interval Note:  12/14/2023 12:57 PM  Alexis Hopkins  has presented today for surgery, with the diagnosis of left femur fracture.  The various methods of treatment have been discussed with the patient and family. After consideration of risks, benefits and other options for treatment, the patient has consented to  Procedure(s): INSERTION, INTRAMEDULLARY ROD, FEMUR (Left) as a surgical intervention.  The patient's history has been reviewed, patient examined, no change in status, stable for surgery.  I have reviewed the patient's chart and labs.  Questions were answered to the patient's satisfaction.   The risks, benefits, and alternatives were discussed with the patient. There are risks associated with the surgery including, but not limited to, problems with anesthesia (death), infection, differences in leg length/angulation/rotation, fracture of bones, loosening or failure of implants, malunion, nonunion, hematoma (blood accumulation) which may require surgical drainage, blood clots, pulmonary embolism, nerve injury (foot drop), and blood vessel injury. The patient understands these risks and elects to proceed.     Iline Oven Awilda Covin

## 2023-12-14 NOTE — Assessment & Plan Note (Signed)
-   A1c 6.7% on admission - Continue SSI and CBG monitoring

## 2023-12-14 NOTE — H&P (View-Only) (Signed)
 ORTHOPAEDIC CONSULTATION  REQUESTING PHYSICIAN: Lewie Chamber, MD  PCP:  Lupita Raider, MD  Chief Complaint: left hip pain and after fall  HPI: Alexis Hopkins is a 78 y.o. female past medical history of diabetes mellitus, hypertension, CAD, TIA on Plavix, patient presents to ED secondary to fall, with left hip pain. Patient had a mechanical fall yesterday. Reports she lost her footing, fell down, hit her left hip.  She denies any head trauma, any loss of consciousness, any dizziness or lightheadedness preceding the fall. She had significant pain and inability to bear weight. ED workup significant for left hip fracture. Orthopedics consulted. She was transferred to [facility] for surgical intervention.   Patient lives at home with her husband. She normally does not use any assistive devices. She does report she has a rolling walker from prior total joint replacements. She takes plavix per baseline. She denies any significant history of DVT.   Past Medical History:  Diagnosis Date   Anemia    Arthritis    Basal cell carcinoma 01/08/2014   BCC NODULAR NOSE MOHS   Cancer (HCC)    SKIN CANCERS REMOVED   Diabetes mellitus without complication (HCC)    Type II   Family history of adverse reaction to anesthesia    "mother always was deathly sick"   History of kidney stones 40 YRS AGO   Hx of skin cancer, basal cell    Hyperlipemia    Hypertension    Osteoporosis    PONV (postoperative nausea and vomiting)    RSD (reflex sympathetic dystrophy)    L ARM   Squamous cell carcinoma of skin    ABOVE LEFT BROW CX3 5FU   TIA (transient ischemic attack) 1994   Takes Plavix   Past Surgical History:  Procedure Laterality Date   ABDOMINAL HYSTERECTOMY  10/02/1993   DILATION AND CURETTAGE OF UTERUS     KNEE ARTHROSCOPY     X 1 on right; X 2 on left   MOHS SURGERY     REVERSE SHOULDER ARTHROPLASTY Right 10/03/2013   Procedure: RIGHT REVERSE SHOULDER ARTHROPLASTY;  Surgeon: Verlee Rossetti, MD;  Location: [facility] OR;  Service: Orthopedics;  Laterality: Right;   REVERSE SHOULDER ARTHROPLASTY Left 07/21/2021   Procedure: REVERSE SHOULDER ARTHROPLASTY;  Surgeon: Beverely Low, MD;  Location: [facility] OR;  Service: Orthopedics;  Laterality: Left;   ROTATOR CUFF REPAIR Right    SHOULDER ARTHROSCOPY Right    TOTAL KNEE ARTHROPLASTY Right 10/05/2014   Procedure: RIGHT TOTAL KNEE ARTHROPLASTY;  Surgeon: Shelda Pal, MD;  Location: [facility] ORS;  Service: Orthopedics;  Laterality: Right;   TOTAL KNEE ARTHROPLASTY Left 03/09/2015   Procedure: LEFT TOTAL KNEE ARTHROPLASTY;  Surgeon: Durene Romans, MD;  Location: [facility] ORS;  Service: Orthopedics;  Laterality: Left;   Social History   Socioeconomic History   Marital status: Married    Spouse name: Not on file   Number of children: Not on file   Years of education: Not on file   Highest education level: Not on file  Occupational History   Not on file  Tobacco Use   Smoking status: Never   Smokeless tobacco: Never  Vaping Use   Vaping status: Never Used  Substance and Sexual Activity   Alcohol use: No   Drug use: No   Sexual activity: Not on file  Other Topics Concern   Not on file  Social History Narrative   Not on file   Social Drivers of Health  Financial Resource Strain: Not on file  Food Insecurity: No Food Insecurity (12/13/2023)   Hunger Vital Sign    Worried About Running Out of Food in the Last Year: Never true    Ran Out of Food in the Last Year: Never true  Transportation Needs: No Transportation Needs (12/13/2023)   PRAPARE - Administrator, Civil Service (Medical): No    Lack of Transportation (Non-Medical): No  Physical Activity: Not on file  Stress: Not on file  Social Connections: Unknown (12/13/2023)   Social Connection and Isolation Panel [NHANES]    Frequency of Communication with Friends and Family: Patient declined    Frequency of Social Gatherings with Friends and Family: Patient declined     Attends Religious Services: Not on Marketing executive or Organizations: Patient declined    Attends Banker Meetings: Patient declined    Marital Status: Patient declined   Family History  Problem Relation Age of Onset   Breast cancer Neg Hx    Allergies  Allergen Reactions   Motrin [Ibuprofen] Other (See Comments)    Per MD-- does not want her taking.    Shellfish Allergy Hives    Shrimp.   Prior to Admission medications   Medication Sig Start Date End Date Taking? Authorizing Provider  amLODipine (NORVASC) 10 MG tablet Take 10 mg by mouth every morning.    Yes [provider]  b complex vitamins capsule Take 1 capsule by mouth daily.   Yes [provider]  clopidogrel (PLAVIX) 75 MG tablet Take 75 mg by mouth daily with breakfast.   Yes [provider]  colesevelam (WELCHOL) 625 MG tablet Take 1,250 mg by mouth 2 (two) times daily with a meal.   Yes [provider]  glimepiride (AMARYL) 2 MG tablet Take 2 mg by mouth See admin instructions. Take 1/2 tablet twice daily.   Yes [provider]  hydrochlorothiazide (HYDRODIURIL) 25 MG tablet Take 12.5 mg by mouth every morning.   Yes [provider]  lisinopril (PRINIVIL,ZESTRIL) 40 MG tablet Take 40 mg by mouth every morning.    Yes [provider]  metFORMIN (GLUCOPHAGE) 1000 MG tablet Take 1,000 mg by mouth 2 (two) times daily with a meal.   Yes [provider]  methocarbamol (ROBAXIN) 500 MG tablet Take 1 tablet every 8 hours by oral route as needed for 10 days.   Yes [provider]  Multiple Vitamin (MULTIVITAMIN WITH MINERALS) TABS tablet Take 1 tablet by mouth every morning.    Yes [provider]  OVER THE COUNTER MEDICATION Place 1 application into both eyes at bedtime. Pure and gentle lubricant eye pad   Yes [provider]  Polyvinyl Alcohol-Povidone PF (REFRESH) 1.4-0.6 % SOLN Place 1 drop into both eyes  daily as needed (Dry eye).   Yes [provider]  pravastatin (PRAVACHOL) 40 MG tablet Take 40 mg by mouth daily. 03/11/21  Yes [provider]  sodium chloride (OCEAN) 0.65 % SOLN nasal spray Place 1-2 sprays into both nostrils daily as needed (Dry nose).   Yes [provider]  amoxicillin (AMOXIL) 500 MG capsule Take 2,000 mg by mouth See admin instructions. Take 4 capsules (2000 mg) by mouth 1 hour prior to dental appointments Patient not taking: Reported on 12/13/2023 05/02/21   [provider]  aspirin 325 MG tablet Take 325 mg by mouth every morning.  Patient not taking: Reported on 12/13/2023  [provider]  calcium-vitamin D (OSCAL WITH D) 500-200 MG-UNIT per tablet Take 1 tablet by mouth every morning.     [provider]  cycloSPORINE (RESTASIS) 0.05 % ophthalmic emulsion 1 drop 2 (two) times daily. Patient not taking: Reported on 12/13/2023    [provider]  Misc Natural Products (OSTEO BI-FLEX TRIPLE STRENGTH PO) Take 1 tablet by mouth 2 (two) times daily. Patient not taking: Reported on 12/13/2023    [provider]   DG Chest 1 View Result Date: 12/13/2023 CLINICAL DATA:  Trip and fall.  Left hip fracture. EXAM: CHEST  1 VIEW COMPARISON:  09/18/2014 FINDINGS: Lung volumes are low.The cardiomediastinal contours are normal. The lungs are clear. Pulmonary vasculature is normal. No consolidation, pleural effusion, or pneumothorax. Bilateral for shoulder arthroplasties. Remote right rib fractures. IMPRESSION: Low lung volumes without acute chest finding. Electronically Signed   By: Narda Rutherford M.D.   On: 12/13/2023 18:21   DG Hip Unilat W or Wo Pelvis 2-3 Views Left Result Date: 12/13/2023 CLINICAL DATA:  Pain.  Fall. EXAM: DG HIP (WITH OR WITHOUT PELVIS) 2-3V LEFT COMPARISON:  None Available. FINDINGS: Comminuted proximal femur fracture. There is involvement of the subtrochanteric femur is well as lesser  trochanter. Mild apex lateral angulation. Femoral head remains seated, no hip dislocation. No additional fracture of the pelvis. Pubic rami are intact. No pubic symphyseal or sacroiliac diastasis. IMPRESSION: Comminuted proximal left femur fracture with involvement of the subtrochanteric femur and lesser trochanter. Electronically Signed   By: Narda Rutherford M.D.   On: 12/13/2023 18:21   DG Knee Left Port Result Date: 12/13/2023 CLINICAL DATA:  Pain after fall EXAM: PORTABLE LEFT KNEE - 2 VIEW COMPARISON:  X-ray 03/26/2005. FINDINGS: Total knee arthroplasty seen. Cemented femoral tibial component. Patellar button. No dislocation. On lateral view there is a cortical deformity along the proximal fibular shaft. No evidence of hardware failure. Osteopenia. No joint effusion on lateral view IMPRESSION: Total knee arthroplasty. Subtle deformity on the lateral view at the proximal fibular shaft. Please correlate to point tenderness. If needed additional workup with a x-ray of the tibia and fibula. Electronically Signed   By: Karen Kays M.D.   On: 12/13/2023 17:55    Positive ROS: All other systems have been reviewed and were otherwise negative with the exception of those mentioned in the HPI and as above.  Physical Exam: General: Alert, no acute distress Cardiovascular: No pedal edema Respiratory: No cyanosis, no use of accessory musculature GI: No organomegaly, abdomen is soft and non-tender Skin: No lesions in the area of chief complaint Neurologic: Sensation intact distally Psychiatric: Patient is competent for consent with normal mood and affect Lymphatic: No axillary or cervical lymphadenopathy  MUSCULOSKELETAL:  Examination of the left hip reveals no skin wounds or lesions. External rotation and shortening. Pain with palpation over trochanteric region.  Sensory and motor function intact in LE bilaterally including plantar flexion, dorsiflexion, and EHL. Distal pedal pulses 2+ bilaterally.  Capillary refill <2 seconds. Calves soft and non-tender. No significant pedal edema.   Assessment: Acute comminuted left intertrochanteric femur fracture.   Plan: I discussed the findings with the patient. She has an acute comminuted left intertrochanteric femur fracture, requiring surgical treatment. Patient already under [facility] admission. Plan for surgery today. Continue NPO. Continue to hold chemical DVT ppx. All questions solicited and answered.     Clois Dupes, PA-C    12/14/2023 10:16 AM

## 2023-12-14 NOTE — TOC Initial Note (Signed)
 Transition of Care [facility]) - Initial/Assessment Note    Patient Details  Name: Alexis Hopkins MRN: [id number] Date of Birth: Sep 21, 1946  Transition of Care [facility]) CM/SW Contact:    Otelia Santee, LCSW Phone Number: 12/14/2023, 10:15 AM  Clinical Narrative:                 Pt presenting due to fall. Pt from home with spouse. Pt is independent for mobility at baseline. TOC will follow for discharge planning.   Expected Discharge Plan:  (TBD\) Barriers to Discharge: Continued Medical Work up   Patient Goals and CMS Choice Patient states their goals for this hospitalization and ongoing recovery are:: For pt to get rehab          Expected Discharge Plan and Services In-house Referral: Clinical Social Work Discharge Planning Services: NA Post Acute Care Choice: Home Health, Skilled Nursing Facility, Durable Medical Equipment Living arrangements for the past 2 months: Single Family Home                                      Prior Living Arrangements/Services Living arrangements for the past 2 months: Single Family Home Lives with:: Spouse Patient language and need for interpreter reviewed:: Yes Do you feel safe going back to the place where you live?: Yes      Need for Family Participation in Patient Care: Yes (Comment) Care giver support system in place?: No (comment) Current home services:  (NA) Criminal Activity/Legal Involvement Pertinent to Current Situation/Hospitalization: No - Comment as needed  Activities of Daily Living      Permission Sought/Granted Permission sought to share information with : Family Supports, Oceanographer granted to share information with : Yes, Verbal Permission Granted  Share Information with NAME: Spouse, son, daughter, granddaughter           Emotional Assessment Appearance:: Appears stated age Attitude/Demeanor/Rapport: Engaged Affect (typically observed): Accepting Orientation: : Oriented  to Self, Oriented to Place, Oriented to  Time, Oriented to Situation Alcohol / Substance Use: Not Applicable Psych Involvement: No (comment)  Admission diagnosis:  Closed left hip fracture (HCC) [S72.002A] Closed fracture of left hip, initial encounter [facility]) [S72.002A] Patient Active Problem List   Diagnosis Date Noted   Closed left hip fracture (HCC) 12/13/2023   Essential hypertension 12/13/2023   Diabetes mellitus (HCC) 12/13/2023   History of stroke 12/13/2023   H/O total shoulder replacement, left 07/21/2021   Obese 03/10/2015   S/P left TKA 03/09/2015   S/P right TKA 10/05/2014   Arthritis of shoulder region, right, degenerative 10/03/2013   PCP:  Lupita Raider, MD Pharmacy:   [facility] [address] [place] - [address] [place] [address] Phone: [phone] Fax: [phone]     Social Drivers of Health (SDOH) Social History: SDOH Screenings   Food Insecurity: No Food Insecurity (12/13/2023)  Housing: Low Risk  (12/13/2023)  Transportation Needs: No Transportation Needs (12/13/2023)  Utilities: Not At Risk (12/13/2023)  Social Connections: Unknown (12/13/2023)  Tobacco Use: Low Risk  (12/13/2023)   SDOH Interventions:     Readmission Risk Interventions    12/14/2023   10:13 AM  Readmission Risk Prevention Plan  Post Dischage Appt Complete  Medication Screening Complete  Transportation Screening Complete

## 2023-12-14 NOTE — Transfer of Care (Signed)
 Immediate Anesthesia Transfer of Care Note  Patient: Alexis Hopkins  Procedure(s) Performed: INSERTION, INTRAMEDULLARY ROD, FEMUR (Left: Hip)  Patient Location: PACU  Anesthesia Type:General  Level of Consciousness: awake and pateint uncooperative  Airway & Oxygen Therapy: Patient Spontanous Breathing and Patient connected to face mask oxygen  Post-op Assessment: Report given to RN and Post -op Vital signs reviewed and stable  Post vital signs: Reviewed and stable  Last Vitals:  Vitals Value Taken Time  BP 117/52 12/14/23 1524  Temp 36.6 C 12/14/23 1524  Pulse 95 12/14/23 1526  Resp 15 12/14/23 1526  SpO2 97 % 12/14/23 1526  Vitals shown include unfiled device data.  Last Pain:  Vitals:   12/14/23 1228  TempSrc:   PainSc: 4       Patients Stated Pain Goal: 2 (12/14/23 1228)  Complications: No notable events documented.

## 2023-12-14 NOTE — Assessment & Plan Note (Addendum)
-   s/p mechanical fall walking up a ramp at home.   - Comminuted proximal left femur fracture with involvement of the subtrochanteric femur and lesser trochanter per xray read -Transferred to [facility] for [facility]opedic surgery evaluation and operative repair - underwent IM nail fixation on 12/14/23 - on asa for DVT ppx at this time - PT/OT evals after surgery; not a CIR candidate after evaluation; now pending SNF workup - very slow to progress with PT; trying to achieve more progression with PT before discharge to SNF

## 2023-12-14 NOTE — Discharge Instructions (Signed)
 Dr. Brian Swinteck Adult Hip & Knee Specialist [facility] [address] [place], [place] [address] [phone]   POSTOPERATIVE DIRECTIONS    Hip Rehabilitation, Guidelines Following Surgery   WEIGHT BEARING Weight bearing as tolerated with assist device (walker, cane, etc) as directed, use it as long as suggested by your surgeon or therapist, typically at least 4-6 weeks.   HOME CARE INSTRUCTIONS  Remove items at home which could result in a fall. This includes throw rugs or furniture in walking pathways.  Continue medications as instructed at time of discharge.  You may have some home medications which will be placed on hold until you complete the course of blood thinner medication.  4 days after discharge, you may start showering. No tub baths or soaking your incisions. Do not put on socks or shoes without following the instructions of your caregivers.   Sit on chairs with arms. Use the chair arms to help push yourself up when arising.  Arrange for the use of a toilet seat elevator so you are not sitting low.   Walk with walker as instructed.  You may resume a sexual relationship in one month or when given the OK by your caregiver.  Use walker as long as suggested by your caregivers.  Avoid periods of inactivity such as sitting longer than an hour when not asleep. This helps prevent blood clots.  You may return to work once you are cleared by your surgeon.  Do not drive a car for 6 weeks or until released by your surgeon.  Do not drive while taking narcotics.  Wear elastic stockings for two weeks following surgery during the day but you may remove then at night.  Make sure you keep all of your appointments after your operation with all of your doctors and caregivers. You should call the office at the above phone number and make an appointment for approximately two weeks after the date of your surgery. Please pick up a stool softener and laxative  for home use as long as you are requiring pain medications.  ICE to the affected hip every three hours for 30 minutes at a time and then as needed for pain and swelling. Continue to use ice on the hip for pain and swelling from surgery. You may notice swelling that will progress down to the foot and ankle.  This is normal after surgery.  Elevate the leg when you are not up walking on it.   It is important for you to complete the blood thinner medication as prescribed by your doctor.  Continue to use the breathing machine which will help keep your temperature down.  It is common for your temperature to cycle up and down following surgery, especially at night when you are not up moving around and exerting yourself.  The breathing machine keeps your lungs expanded and your temperature down.  RANGE OF MOTION AND STRENGTHENING EXERCISES  These exercises are designed to help you keep full movement of your hip joint. Follow your caregiver's or physical therapist's instructions. Perform all exercises about fifteen times, three times per day or as directed. Exercise both hips, even if you have had only one joint replacement. These exercises can be done on a training (exercise) mat, on the floor, on a table or on a bed. Use whatever works the best and is most comfortable for you. Use music or television while you are exercising so that the exercises are a pleasant break in your day. This   will make your life better with the exercises acting as a break in routine you can look forward to.  Lying on your back, slowly slide your foot toward your buttocks, raising your knee up off the floor. Then slowly slide your foot back down until your leg is straight again.  Lying on your back spread your legs as far apart as you can without causing discomfort.  Lying on your side, raise your upper leg and foot straight up from the floor as far as is comfortable. Slowly lower the leg and repeat.  Lying on your back, tighten up the  muscle in the front of your thigh (quadriceps muscles). You can do this by keeping your leg straight and trying to raise your heel off the floor. This helps strengthen the largest muscle supporting your knee.  Lying on your back, tighten up the muscles of your buttocks both with the legs straight and with the knee bent at a comfortable angle while keeping your heel on the floor.   SKILLED REHAB INSTRUCTIONS: If the patient is transferred to a skilled rehab facility following release from the hospital, a list of the current medications will be sent to the facility for the patient to continue.  When discharged from the skilled rehab facility, please have the facility set up the patient's Home Health Physical Therapy prior to being released. Also, the skilled facility will be responsible for providing the patient with their medications at time of release from the facility to include their pain medication and their blood thinner medication. If the patient is still at the rehab facility at time of the two week follow up appointment, the skilled rehab facility will also need to assist the patient in arranging follow up appointment in our office and any transportation needs.  MAKE SURE YOU:  Understand these instructions.  Will watch your condition.  Will get help right away if you are not doing well or get worse.  Pick up stool softner and laxative for home use following surgery while on pain medications. Daily dry dressing changes as needed. In 4 days, you may remove your dressings and begin taking showers - no tub baths or soaking the incisions. Continue to use ice for pain and swelling after surgery. Do not use any lotions or creams on the incision until instructed by your surgeon.   

## 2023-12-14 NOTE — Assessment & Plan Note (Signed)
Continue amlodipine, HCTZ.

## 2023-12-14 NOTE — Assessment & Plan Note (Addendum)
Body mass index is 31.5 kg/m.

## 2023-12-14 NOTE — Anesthesia Preprocedure Evaluation (Addendum)
 Anesthesia Evaluation  Patient identified by MRN, date of birth, ID band Patient awake    Reviewed: Allergy & Precautions, NPO status , Patient's Chart, lab work & pertinent test results  Airway Mallampati: II  TM Distance: >3 FB Neck ROM: Full    Dental no notable dental hx. (+) Teeth Intact, Dental Advisory Given   Pulmonary    Pulmonary exam normal breath sounds clear to auscultation       Cardiovascular hypertension, Normal cardiovascular exam Rhythm:Regular Rate:Normal     Neuro/Psych TIA (in plavix) Neuromuscular disease    GI/Hepatic   Endo/Other  diabetes    Renal/GU      Musculoskeletal  (+) Arthritis ,    Abdominal   Peds  Hematology   Anesthesia Other Findings   Reproductive/Obstetrics                             Anesthesia Physical Anesthesia Plan  ASA: 3  Anesthesia Plan: General   Post-op Pain Management:    Induction: Intravenous  PONV Risk Score and Plan: 4 or greater and Treatment may vary due to age or medical condition and Ondansetron  Airway Management Planned: Oral ETT  Additional Equipment: None  Intra-op Plan:   Post-operative Plan: Extubation in OR  Informed Consent: I have reviewed the patients History and Physical, chart, labs and discussed the procedure including the risks, benefits and alternatives for the proposed anesthesia with the patient or authorized representative who has indicated his/her understanding and acceptance.     Dental advisory given  Plan Discussed with:   Anesthesia Plan Comments:        Anesthesia Quick Evaluation

## 2023-12-14 NOTE — Assessment & Plan Note (Addendum)
-   On aspirin and Plavix at home; has been resumed postop -Continue pravastatin

## 2023-12-14 NOTE — Progress Notes (Signed)
 Progress Note    Alexis Hopkins   ZOX:[id number]  DOB: Mar 25, 1946  DOA: 12/13/2023     1 PCP: Lupita Raider, MD  Initial CC: fall at home  Hospital Course: [name] is a 78 year old female with PMH DM II, arthritis, BCC, HTN, HLD, TIA who presented initially to [facility] after a mechanical fall at home.  She was walking up the ramp at her house and stumbled after her foot got caught.  She was found to have left femur fracture and was transferred to [facility] for operative repair with orthopedic surgery.  Interval History:  Expected pain in LLE in bed. Understands plan for surgery later today. No concerns this morning.  Confirms she lost her footing when walking up the ramp at home and foot got caught under her and she fell.   Assessment and Plan: * Closed left hip fracture (HCC) - s/p mechanical fall walking up a ramp at home.   - Comminuted proximal left femur fracture with involvement of the subtrochanteric femur and lesser trochanter per xray read -Transferred to [facility] for [facility] surgery evaluation and operative repair - PT/OT evals after surgery   Diabetes mellitus (HCC) - A1c 6.7% on admission - Continue SSI and CBG monitoring  History of TIA (transient ischemic attack) - On aspirin and Plavix at home -Continue pravastatin  Essential hypertension - Continue amlodipine, HCTZ  Obese - Body mass index is 31.5 kg/m.     Old records reviewed in assessment of this patient  Antimicrobials:   DVT prophylaxis:  SCDs Start: 12/13/23 1730   Code Status:   Code Status: Full Code  Mobility Assessment (Last 72 Hours)     Mobility Assessment     Row Name 12/13/23 2030           Does patient have an order for bedrest or is patient medically unstable Yes- Bedfast (Level 1) - Complete                Barriers to discharge: none Disposition Plan:  TBD HH orders placed:  Status is: Inpt  Objective: Blood pressure 110/75, pulse 98, temperature  97.9 F (36.6 C), temperature source Oral, resp. rate 16, weight 79.4 kg, SpO2 98%.  Examination:  Physical Exam Constitutional:      Appearance: Normal appearance.  HENT:     Head: Normocephalic and atraumatic.     Mouth/Throat:     Mouth: Mucous membranes are moist.  Eyes:     Extraocular Movements: Extraocular movements intact.  Cardiovascular:     Rate and Rhythm: Normal rate and regular rhythm.  Pulmonary:     Effort: Pulmonary effort is normal. No respiratory distress.     Breath sounds: Normal breath sounds. No wheezing.  Abdominal:     General: Bowel sounds are normal. There is no distension.     Palpations: Abdomen is soft.     Tenderness: There is no abdominal tenderness.  Musculoskeletal:        General: Tenderness (LLE) present.     Cervical back: Normal range of motion and neck supple.  Skin:    General: Skin is warm and dry.  Neurological:     General: No focal deficit present.     Mental Status: She is alert.  Psychiatric:        Mood and Affect: Mood normal.      Consultants:  Orthopedic surgery  Procedures:    Data Reviewed: Results for orders placed or performed during the hospital encounter of  12/13/23 (from the past 24 hours)  CBC     Status: Abnormal   Collection Time: 12/13/23  2:55 PM  Result Value Ref Range   WBC 13.2 (H) 4.0 - 10.5 K/uL   RBC 4.15 3.87 - 5.11 MIL/uL   Hemoglobin 12.1 12.0 - 15.0 g/dL   HCT 16.1 09.6 - 04.5 %   MCV 90.6 80.0 - 100.0 fL   MCH 29.2 26.0 - 34.0 pg   MCHC 32.2 30.0 - 36.0 g/dL   RDW 40.9 81.1 - 91.4 %   Platelets 278 150 - 400 K/uL   nRBC 0.0 0.0 - 0.2 %  Basic metabolic panel     Status: Abnormal   Collection Time: 12/13/23  2:55 PM  Result Value Ref Range   Sodium 139 135 - 145 mmol/L   Potassium 4.1 3.5 - 5.1 mmol/L   Chloride 103 98 - 111 mmol/L   CO2 23 22 - 32 mmol/L   Glucose, Bld 138 (H) 70 - 99 mg/dL   BUN 32 (H) 8 - 23 mg/dL   Creatinine, Ser 7.82 (H) 0.44 - 1.00 mg/dL   Calcium 9.4 8.9 -  95.6 mg/dL   GFR, Estimated 41 (L) >60 mL/min   Anion gap 13 5 - 15  Hemoglobin A1c     Status: Abnormal   Collection Time: 12/13/23  2:55 PM  Result Value Ref Range   Hgb A1c MFr Bld 6.7 (H) 4.8 - 5.6 %   Mean Plasma Glucose 145.59 mg/dL  Glucose, capillary     Status: Abnormal   Collection Time: 12/13/23  6:44 PM  Result Value Ref Range   Glucose-Capillary 225 (H) 70 - 99 mg/dL  Glucose, capillary     Status: Abnormal   Collection Time: 12/13/23  8:43 PM  Result Value Ref Range   Glucose-Capillary 231 (H) 70 - 99 mg/dL  Surgical PCR screen     Status: None   Collection Time: 12/13/23  9:59 PM   Specimen: Nasal Mucosa; Nasal Swab  Result Value Ref Range   MRSA, PCR NEGATIVE NEGATIVE   Staphylococcus aureus NEGATIVE NEGATIVE  Basic metabolic panel     Status: Abnormal   Collection Time: 12/14/23  5:40 AM  Result Value Ref Range   Sodium 136 135 - 145 mmol/L   Potassium 4.4 3.5 - 5.1 mmol/L   Chloride 102 98 - 111 mmol/L   CO2 22 22 - 32 mmol/L   Glucose, Bld 141 (H) 70 - 99 mg/dL   BUN 39 (H) 8 - 23 mg/dL   Creatinine, Ser 2.13 (H) 0.44 - 1.00 mg/dL   Calcium 8.9 8.9 - 08.6 mg/dL   GFR, Estimated 29 (L) >60 mL/min   Anion gap 12 5 - 15  CBC     Status: Abnormal   Collection Time: 12/14/23  5:40 AM  Result Value Ref Range   WBC 9.9 4.0 - 10.5 K/uL   RBC 3.79 (L) 3.87 - 5.11 MIL/uL   Hemoglobin 10.8 (L) 12.0 - 15.0 g/dL   HCT 57.8 (L) 46.9 - 62.9 %   MCV 93.7 80.0 - 100.0 fL   MCH 28.5 26.0 - 34.0 pg   MCHC 30.4 30.0 - 36.0 g/dL   RDW 52.8 41.3 - 24.4 %   Platelets 260 150 - 400 K/uL   nRBC 0.0 0.0 - 0.2 %  Glucose, capillary     Status: Abnormal   Collection Time: 12/14/23  7:32 AM  Result Value Ref Range   Glucose-Capillary 134 (  H) 70 - 99 mg/dL  Glucose, capillary     Status: Abnormal   Collection Time: 12/14/23 11:30 AM  Result Value Ref Range   Glucose-Capillary 104 (H) 70 - 99 mg/dL    I have reviewed pertinent nursing notes, vitals, labs, and images as  necessary. I have ordered labwork to follow up on as indicated.  I have reviewed the last notes from staff over past 24 hours. I have discussed patient's care plan and test results with nursing staff, CM/SW, and other staff as appropriate.  Time spent: Greater than 50% of the 55 minute visit was spent in counseling/coordination of care for the patient as laid out in the A&P.   LOS: 1 day   Lewie Chamber, MD [facility] 12/14/2023, 12:15 PM

## 2023-12-14 NOTE — Progress Notes (Signed)
 Initial Nutrition Assessment  DOCUMENTATION CODES:   Obesity unspecified  INTERVENTION:   -Glucerna Shake po BID, each supplement provides 220 kcal and 10 grams of protein    -Multivitamin with minerals daily  -B complex w/ Vitamin C daily  NUTRITION DIAGNOSIS:   Increased nutrient needs related to hip fracture, post-op healing as evidenced by estimated needs.  GOAL:   Patient will meet greater than or equal to 90% of their needs  MONITOR:   PO intake, Supplement acceptance  REASON FOR ASSESSMENT:   Consult Hip fracture protocol  ASSESSMENT:   78 year old female with PMH DM II, arthritis, BCC, HTN, HLD, TIA who presented initially to [facility] after a mechanical fall at home.  She was walking up the ramp at her house and stumbled after her foot got caught.  She was found to have left femur fracture  Patient in room, family at bedside. Pt reports having a good appetite PTA. Over the past few days she has been unable to eat much. States she did eat a sandwich last night. Currently NPO awaiting surgery for hip repair. Denies any issues with chewing or swallowing. States she has protein shakes at home. Plans to resume them at home. Will order Glucerna here, pt already ordered vitamin supplements. In addition to a MVI, pt takes elderberry, probiotics, fiber and iron supplements.  Per weight records, no recent weight changes.   Medications: B complex w/ Vitamin C, OSCAL-D, Multivitamin with minerals daily, Lactated ringers   Labs reviewed: CBGs: 104-231   NUTRITION - FOCUSED PHYSICAL EXAM:  Flowsheet Row Most Recent Value  Orbital Region No depletion  Upper Arm Region Mild depletion  Thoracic and Lumbar Region No depletion  Buccal Region No depletion  Temple Region No depletion  Clavicle Bone Region No depletion  Clavicle and Acromion Bone Region No depletion  Scapular Bone Region No depletion  Dorsal Hand No depletion  Patellar Region Unable to assess  Anterior  Thigh Region Unable to assess  Posterior Calf Region Unable to assess  Edema (RD Assessment) None  Hair Reviewed  Eyes Reviewed  Mouth Reviewed  Skin Reviewed  Nails Reviewed       Diet Order:   Diet Order             Diet NPO time specified Except for: Sips with Meds  Diet effective midnight           Diet NPO time specified  Diet effective midnight                   EDUCATION NEEDS:   Education needs have been addressed  Skin:  Skin Assessment: Reviewed RN Assessment  Last BM:  PTA  Height:   Ht Readings from Last 1 Encounters:  12/14/23 5\' 2"  (1.575 m)    Weight:   Wt Readings from Last 1 Encounters:  12/14/23 79.4 kg    BMI:  Body mass index is 32.01 kg/m.  Estimated Nutritional Needs:   Kcal:  1300-1500  Protein:  60-70g protein  Fluid:  1.5L/day   Tilda Franco, MS, RD, LDN Inpatient Clinical Dietitian Contact via Secure chat

## 2023-12-14 NOTE — Consult Note (Signed)
 ORTHOPAEDIC CONSULTATION  REQUESTING PHYSICIAN: Lewie Chamber, MD  PCP:  Lupita Raider, MD  Chief Complaint: left hip pain and after fall  HPI: Alexis Hopkins is a 78 y.o. female past medical history of diabetes mellitus, hypertension, CAD, TIA on Plavix, patient presents to ED secondary to fall, with left hip pain. Patient had a mechanical fall yesterday. Reports she lost her footing, fell down, hit her left hip.  She denies any head trauma, any loss of consciousness, any dizziness or lightheadedness preceding the fall. She had significant pain and inability to bear weight. ED workup significant for left hip fracture. Orthopedics consulted. She was transferred to [facility] for surgical intervention.   Patient lives at home with her husband. She normally does not use any assistive devices. She does report she has a rolling walker from prior total joint replacements. She takes plavix per baseline. She denies any significant history of DVT.   Past Medical History:  Diagnosis Date   Anemia    Arthritis    Basal cell carcinoma 01/08/2014   BCC NODULAR NOSE MOHS   Cancer (HCC)    SKIN CANCERS REMOVED   Diabetes mellitus without complication (HCC)    Type II   Family history of adverse reaction to anesthesia    "mother always was deathly sick"   History of kidney stones 40 YRS AGO   Hx of skin cancer, basal cell    Hyperlipemia    Hypertension    Osteoporosis    PONV (postoperative nausea and vomiting)    RSD (reflex sympathetic dystrophy)    L ARM   Squamous cell carcinoma of skin    ABOVE LEFT BROW CX3 5FU   TIA (transient ischemic attack) 1994   Takes Plavix   Past Surgical History:  Procedure Laterality Date   ABDOMINAL HYSTERECTOMY  10/02/1993   DILATION AND CURETTAGE OF UTERUS     KNEE ARTHROSCOPY     X 1 on right; X 2 on left   MOHS SURGERY     REVERSE SHOULDER ARTHROPLASTY Right 10/03/2013   Procedure: RIGHT REVERSE SHOULDER ARTHROPLASTY;  Surgeon: Verlee Rossetti, MD;  Location: [facility] OR;  Service: Orthopedics;  Laterality: Right;   REVERSE SHOULDER ARTHROPLASTY Left 07/21/2021   Procedure: REVERSE SHOULDER ARTHROPLASTY;  Surgeon: Beverely Low, MD;  Location: [facility] OR;  Service: Orthopedics;  Laterality: Left;   ROTATOR CUFF REPAIR Right    SHOULDER ARTHROSCOPY Right    TOTAL KNEE ARTHROPLASTY Right 10/05/2014   Procedure: RIGHT TOTAL KNEE ARTHROPLASTY;  Surgeon: Shelda Pal, MD;  Location: [facility] ORS;  Service: Orthopedics;  Laterality: Right;   TOTAL KNEE ARTHROPLASTY Left 03/09/2015   Procedure: LEFT TOTAL KNEE ARTHROPLASTY;  Surgeon: Durene Romans, MD;  Location: [facility] ORS;  Service: Orthopedics;  Laterality: Left;   Social History   Socioeconomic History   Marital status: Married    Spouse name: Not on file   Number of children: Not on file   Years of education: Not on file   Highest education level: Not on file  Occupational History   Not on file  Tobacco Use   Smoking status: Never   Smokeless tobacco: Never  Vaping Use   Vaping status: Never Used  Substance and Sexual Activity   Alcohol use: No   Drug use: No   Sexual activity: Not on file  Other Topics Concern   Not on file  Social History Narrative   Not on file   Social Drivers of Health  Financial Resource Strain: Not on file  Food Insecurity: No Food Insecurity (12/13/2023)   Hunger Vital Sign    Worried About Running Out of Food in the Last Year: Never true    Ran Out of Food in the Last Year: Never true  Transportation Needs: No Transportation Needs (12/13/2023)   PRAPARE - Administrator, Civil Service (Medical): No    Lack of Transportation (Non-Medical): No  Physical Activity: Not on file  Stress: Not on file  Social Connections: Unknown (12/13/2023)   Social Connection and Isolation Panel [NHANES]    Frequency of Communication with Friends and Family: Patient declined    Frequency of Social Gatherings with Friends and Family: Patient declined     Attends Religious Services: Not on Marketing executive or Organizations: Patient declined    Attends Banker Meetings: Patient declined    Marital Status: Patient declined   Family History  Problem Relation Age of Onset   Breast cancer Neg Hx    Allergies  Allergen Reactions   Motrin [Ibuprofen] Other (See Comments)    Per MD-- does not want her taking.    Shellfish Allergy Hives    Shrimp.   Prior to Admission medications   Medication Sig Start Date End Date Taking? Authorizing Provider  amLODipine (NORVASC) 10 MG tablet Take 10 mg by mouth every morning.    Yes [provider]  b complex vitamins capsule Take 1 capsule by mouth daily.   Yes [provider]  clopidogrel (PLAVIX) 75 MG tablet Take 75 mg by mouth daily with breakfast.   Yes [provider]  colesevelam (WELCHOL) 625 MG tablet Take 1,250 mg by mouth 2 (two) times daily with a meal.   Yes [provider]  glimepiride (AMARYL) 2 MG tablet Take 2 mg by mouth See admin instructions. Take 1/2 tablet twice daily.   Yes [provider]  hydrochlorothiazide (HYDRODIURIL) 25 MG tablet Take 12.5 mg by mouth every morning.   Yes [provider]  lisinopril (PRINIVIL,ZESTRIL) 40 MG tablet Take 40 mg by mouth every morning.    Yes [provider]  metFORMIN (GLUCOPHAGE) 1000 MG tablet Take 1,000 mg by mouth 2 (two) times daily with a meal.   Yes [provider]  methocarbamol (ROBAXIN) 500 MG tablet Take 1 tablet every 8 hours by oral route as needed for 10 days.   Yes [provider]  Multiple Vitamin (MULTIVITAMIN WITH MINERALS) TABS tablet Take 1 tablet by mouth every morning.    Yes [provider]  OVER THE COUNTER MEDICATION Place 1 application into both eyes at bedtime. Pure and gentle lubricant eye pad   Yes [provider]  Polyvinyl Alcohol-Povidone PF (REFRESH) 1.4-0.6 % SOLN Place 1 drop into both eyes  daily as needed (Dry eye).   Yes [provider]  pravastatin (PRAVACHOL) 40 MG tablet Take 40 mg by mouth daily. 03/11/21  Yes [provider]  sodium chloride (OCEAN) 0.65 % SOLN nasal spray Place 1-2 sprays into both nostrils daily as needed (Dry nose).   Yes [provider]  amoxicillin (AMOXIL) 500 MG capsule Take 2,000 mg by mouth See admin instructions. Take 4 capsules (2000 mg) by mouth 1 hour prior to dental appointments Patient not taking: Reported on 12/13/2023 05/02/21   [provider]  aspirin 325 MG tablet Take 325 mg by mouth every morning.  Patient not taking: Reported on 12/13/2023  [provider]  calcium-vitamin D (OSCAL WITH D) 500-200 MG-UNIT per tablet Take 1 tablet by mouth every morning.     [provider]  cycloSPORINE (RESTASIS) 0.05 % ophthalmic emulsion 1 drop 2 (two) times daily. Patient not taking: Reported on 12/13/2023    [provider]  Misc Natural Products (OSTEO BI-FLEX TRIPLE STRENGTH PO) Take 1 tablet by mouth 2 (two) times daily. Patient not taking: Reported on 12/13/2023    [provider]   DG Chest 1 View Result Date: 12/13/2023 CLINICAL DATA:  Trip and fall.  Left hip fracture. EXAM: CHEST  1 VIEW COMPARISON:  09/18/2014 FINDINGS: Lung volumes are low.The cardiomediastinal contours are normal. The lungs are clear. Pulmonary vasculature is normal. No consolidation, pleural effusion, or pneumothorax. Bilateral for shoulder arthroplasties. Remote right rib fractures. IMPRESSION: Low lung volumes without acute chest finding. Electronically Signed   By: Narda Rutherford M.D.   On: 12/13/2023 18:21   DG Hip Unilat W or Wo Pelvis 2-3 Views Left Result Date: 12/13/2023 CLINICAL DATA:  Pain.  Fall. EXAM: DG HIP (WITH OR WITHOUT PELVIS) 2-3V LEFT COMPARISON:  None Available. FINDINGS: Comminuted proximal femur fracture. There is involvement of the subtrochanteric femur is well as lesser  trochanter. Mild apex lateral angulation. Femoral head remains seated, no hip dislocation. No additional fracture of the pelvis. Pubic rami are intact. No pubic symphyseal or sacroiliac diastasis. IMPRESSION: Comminuted proximal left femur fracture with involvement of the subtrochanteric femur and lesser trochanter. Electronically Signed   By: Narda Rutherford M.D.   On: 12/13/2023 18:21   DG Knee Left Port Result Date: 12/13/2023 CLINICAL DATA:  Pain after fall EXAM: PORTABLE LEFT KNEE - 2 VIEW COMPARISON:  X-ray 03/26/2005. FINDINGS: Total knee arthroplasty seen. Cemented femoral tibial component. Patellar button. No dislocation. On lateral view there is a cortical deformity along the proximal fibular shaft. No evidence of hardware failure. Osteopenia. No joint effusion on lateral view IMPRESSION: Total knee arthroplasty. Subtle deformity on the lateral view at the proximal fibular shaft. Please correlate to point tenderness. If needed additional workup with a x-ray of the tibia and fibula. Electronically Signed   By: Karen Kays M.D.   On: 12/13/2023 17:55    Positive ROS: All other systems have been reviewed and were otherwise negative with the exception of those mentioned in the HPI and as above.  Physical Exam: General: Alert, no acute distress Cardiovascular: No pedal edema Respiratory: No cyanosis, no use of accessory musculature GI: No organomegaly, abdomen is soft and non-tender Skin: No lesions in the area of chief complaint Neurologic: Sensation intact distally Psychiatric: Patient is competent for consent with normal mood and affect Lymphatic: No axillary or cervical lymphadenopathy  MUSCULOSKELETAL:  Examination of the left hip reveals no skin wounds or lesions. External rotation and shortening. Pain with palpation over trochanteric region.  Sensory and motor function intact in LE bilaterally including plantar flexion, dorsiflexion, and EHL. Distal pedal pulses 2+ bilaterally.  Capillary refill <2 seconds. Calves soft and non-tender. No significant pedal edema.   Assessment: Acute comminuted left intertrochanteric femur fracture.   Plan: I discussed the findings with the patient. She has an acute comminuted left intertrochanteric femur fracture, requiring surgical treatment. Patient already under [facility] admission. Plan for surgery today. Continue NPO. Continue to hold chemical DVT ppx. All questions solicited and answered.     Clois Dupes, PA-C    12/14/2023 10:16 AM

## 2023-12-14 NOTE — Anesthesia Procedure Notes (Signed)
 Procedure Name: Intubation Date/Time: 12/14/2023 1:24 PM  Performed by: Dairl Ponder, CRNAPre-anesthesia Checklist: Patient identified, Emergency Drugs available, Suction available and Patient being monitored Patient Re-evaluated:Patient Re-evaluated prior to induction Oxygen Delivery Method: Circle System Utilized Preoxygenation: Pre-oxygenation with 100% oxygen Induction Type: IV induction Ventilation: Mask ventilation without difficulty Laryngoscope Size: Mac and 3 Grade View: Grade I Tube type: Oral Tube size: 7.0 mm Number of attempts: 1 Airway Equipment and Method: Stylet and Oral airway Placement Confirmation: ETT inserted through vocal cords under direct vision, positive ETCO2 and breath sounds checked- equal and bilateral Secured at: 21 cm Tube secured with: Tape Dental Injury: Teeth and Oropharynx as per pre-operative assessment

## 2023-12-14 NOTE — Hospital Course (Addendum)
 78 year old woman presented to [facility] after mechanical fall at home, transferred to [facility] for management of left intertrochanteric femur fracture which was accomplished expeditiously.  She had postoperative anemia treated with PRBC transfusion.  Hospitalization prolonged for debility and urinary retention.  Now clinically stable for SNF.  Consultants Orthopedics   Procedures/Events 3/14 Intramedullary fixation, Left femur.  3/15 one unit PRBC 3/16 one unit PRBC

## 2023-12-15 DIAGNOSIS — D62 Acute posthemorrhagic anemia: Secondary | ICD-10-CM | POA: Diagnosis not present

## 2023-12-15 DIAGNOSIS — S72002A Fracture of unspecified part of neck of left femur, initial encounter for closed fracture: Secondary | ICD-10-CM | POA: Diagnosis not present

## 2023-12-15 LAB — BASIC METABOLIC PANEL
Anion gap: 10 (ref 5–15)
BUN: 46 mg/dL — ABNORMAL HIGH (ref 8–23)
CO2: 21 mmol/L — ABNORMAL LOW (ref 22–32)
Calcium: 8 mg/dL — ABNORMAL LOW (ref 8.9–10.3)
Chloride: 103 mmol/L (ref 98–111)
Creatinine, Ser: 1.76 mg/dL — ABNORMAL HIGH (ref 0.44–1.00)
GFR, Estimated: 30 mL/min — ABNORMAL LOW (ref >60)
Glucose, Bld: 182 mg/dL — ABNORMAL HIGH (ref 70–99)
Potassium: 4.6 mmol/L (ref 3.5–5.1)
Sodium: 134 mmol/L — ABNORMAL LOW (ref 135–145)

## 2023-12-15 LAB — CBC WITH DIFFERENTIAL/PLATELET
Abs Immature Granulocytes: 0.06 10*3/uL (ref 0.00–0.07)
Basophils Absolute: 0 10*3/uL (ref 0.0–0.1)
Basophils Relative: 0 %
Eosinophils Absolute: 0 10*3/uL (ref 0.0–0.5)
Eosinophils Relative: 0 %
HCT: 21.6 % — ABNORMAL LOW (ref 36.0–46.0)
Hemoglobin: 6.7 g/dL — CL (ref 12.0–15.0)
Immature Granulocytes: 1 %
Lymphocytes Relative: 23 %
Lymphs Abs: 2.2 10*3/uL (ref 0.7–4.0)
MCH: 29 pg (ref 26.0–34.0)
MCHC: 31 g/dL (ref 30.0–36.0)
MCV: 93.5 fL (ref 80.0–100.0)
Monocytes Absolute: 1.4 10*3/uL — ABNORMAL HIGH (ref 0.1–1.0)
Monocytes Relative: 14 %
Neutro Abs: 6 10*3/uL (ref 1.7–7.7)
Neutrophils Relative %: 62 %
Platelets: 171 10*3/uL (ref 150–400)
RBC: 2.31 MIL/uL — ABNORMAL LOW (ref 3.87–5.11)
RDW: 15 % (ref 11.5–15.5)
WBC: 9.7 10*3/uL (ref 4.0–10.5)
nRBC: 0 % (ref 0.0–0.2)

## 2023-12-15 LAB — MAGNESIUM: Magnesium: 2 mg/dL (ref 1.7–2.4)

## 2023-12-15 LAB — HEMOGLOBIN AND HEMATOCRIT, BLOOD
HCT: 25.8 % — ABNORMAL LOW (ref 36.0–46.0)
Hemoglobin: 8.1 g/dL — ABNORMAL LOW (ref 12.0–15.0)

## 2023-12-15 LAB — GLUCOSE, CAPILLARY
Glucose-Capillary: 177 mg/dL — ABNORMAL HIGH (ref 70–99)
Glucose-Capillary: 219 mg/dL — ABNORMAL HIGH (ref 70–99)
Glucose-Capillary: 238 mg/dL — ABNORMAL HIGH (ref 70–99)
Glucose-Capillary: 294 mg/dL — ABNORMAL HIGH (ref 70–99)

## 2023-12-15 LAB — PREPARE RBC (CROSSMATCH)

## 2023-12-15 MED ORDER — LACTATED RINGERS IV SOLN: INTRAVENOUS | Status: AC

## 2023-12-15 MED ORDER — SODIUM CHLORIDE 0.9% IV SOLUTION: Freq: Once | INTRAVENOUS | Status: DC

## 2023-12-15 NOTE — Progress Notes (Signed)
    Subjective: 1 Day Post-Op Procedure(s) (LRB): INSERTION, INTRAMEDULLARY ROD, FEMUR (Left) Patient reports pain as moderate.   Denies CP or SOB.  Foley catheter remains in place.  Positive flatus. Objective: Vital signs in last 24 hours: Temp:  [97.8 F (36.6 C)-98.4 F (36.9 C)] 98.2 F (36.8 C) (03/15 0849) Pulse Rate:  [83-102] 83 (03/15 0849) Resp:  [11-18] 16 (03/15 0849) BP: (86-127)/(42-62) 125/56 (03/15 0849) SpO2:  [90 %-99 %] 98 % (03/15 0849) Weight:  [79.4 kg] 79.4 kg (03/14 1228)  Intake/Output from previous day: 03/14 0701 - 03/15 0700 In: 1872.9 [I.V.:1159.9; IV Piggyback:713] Out: 1000 [Urine:750; Blood:250] Intake/Output this shift: No intake/output data recorded.  Labs: Recent Labs    12/13/23 1455 12/14/23 0540 12/15/23 0625  HGB 12.1 10.8* 6.7*   Recent Labs    12/14/23 0540 12/15/23 0625  WBC 9.9 9.7  RBC 3.79* 2.31*  HCT 35.5* 21.6*  PLT 260 171   Recent Labs    12/14/23 0540 12/15/23 0625  NA 136 134*  K 4.4 4.6  CL 102 103  CO2 22 21*  BUN 39* 46*  CREATININE 1.78* 1.76*  GLUCOSE 141* 182*  CALCIUM 8.9 8.0*   No results for input(s): "LABPT", "INR" in the last 72 hours.  Physical Exam: Neurologically intact Neurovascular intact Sensation intact distally Intact pulses distally Dorsiflexion/Plantar flexion intact Incision: dressing C/D/I No cellulitis present Compartment soft Body mass index is 32.01 kg/m.   Assessment/Plan: 1 Day Post-Op Procedure(s) (LRB): INSERTION, INTRAMEDULLARY ROD, FEMUR (Left)  1.  Start physical therapy and Occupational Therapy today 2.  Continue incentive spirometry 3.  Continue medications per hospitalist management 4.  DC recommendations per hospitalists 5. Continue DVT prophylaxis with SCDs   Roslyn Smiling for Dr. Venita Lick [facility] [phone] 12/15/2023, 8:57 AM

## 2023-12-15 NOTE — Plan of Care (Signed)
  Problem: Education: Goal: Knowledge of General Education information will improve Description: Including pain rating scale, medication(s)/side effects and non-pharmacologic comfort measures Outcome: Progressing   Problem: Health Behavior/Discharge Planning: Goal: Ability to manage health-related needs will improve Outcome: Progressing   Problem: Clinical Measurements: Goal: Ability to maintain clinical measurements within normal limits will improve Outcome: Progressing Goal: Will remain free from infection Outcome: Progressing Goal: Diagnostic test results will improve Outcome: Progressing Goal: Respiratory complications will improve Outcome: Progressing Goal: Cardiovascular complication will be avoided Outcome: Progressing   Problem: Nutrition: Goal: Adequate nutrition will be maintained Outcome: Progressing   Problem: Coping: Goal: Level of anxiety will decrease Outcome: Progressing   Problem: Elimination: Goal: Will not experience complications related to bowel motility Outcome: Progressing Goal: Will not experience complications related to urinary retention Outcome: Progressing   Problem: Safety: Goal: Ability to remain free from injury will improve Outcome: Progressing   Problem: Skin Integrity: Goal: Risk for impaired skin integrity will decrease Outcome: Progressing   Problem: Education: Goal: Ability to describe self-care measures that may prevent or decrease complications (Diabetes Survival Skills Education) will improve Outcome: Progressing Goal: Individualized Educational Video(s) Outcome: Progressing   Problem: Coping: Goal: Ability to adjust to condition or change in health will improve Outcome: Progressing   Problem: Fluid Volume: Goal: Ability to maintain a balanced intake and output will improve Outcome: Progressing   Problem: Health Behavior/Discharge Planning: Goal: Ability to identify and utilize available resources and services will  improve Outcome: Progressing Goal: Ability to manage health-related needs will improve Outcome: Progressing   Problem: Metabolic: Goal: Ability to maintain appropriate glucose levels will improve Outcome: Progressing   Problem: Nutritional: Goal: Maintenance of adequate nutrition will improve Outcome: Progressing Goal: Progress toward achieving an optimal weight will improve Outcome: Progressing   Problem: Skin Integrity: Goal: Risk for impaired skin integrity will decrease Outcome: Progressing   Problem: Tissue Perfusion: Goal: Adequacy of tissue perfusion will improve Outcome: Progressing   Problem: Education: Goal: Verbalization of understanding the information provided (i.e., activity precautions, restrictions, etc) will improve Outcome: Progressing Goal: Individualized Educational Video(s) Outcome: Progressing   Problem: Activity: Goal: Ability to ambulate and perform ADLs will improve Outcome: Progressing   Problem: Clinical Measurements: Goal: Postoperative complications will be avoided or minimized Outcome: Progressing   Problem: Self-Concept: Goal: Ability to maintain and perform role responsibilities to the fullest extent possible will improve Outcome: Progressing   Problem: Pain Management: Goal: Pain level will decrease Outcome: Progressing   Problem: Activity: Goal: Risk for activity intolerance will decrease Outcome: Not Progressing   Problem: Pain Managment: Goal: General experience of comfort will improve and/or be controlled Outcome: Not Progressing

## 2023-12-15 NOTE — Progress Notes (Addendum)
 Date and time results received: 12/15/23 0725   Test: HGB Critical Value: 6.7  Name of Provider Notified: Girguis  Orders Received? Or Actions Taken?:  yes

## 2023-12-15 NOTE — Evaluation (Signed)
 Physical Therapy Evaluation Patient Details Name: Alexis Hopkins MRN: [id number] DOB: 1946-07-06 Today's Date: 12/15/2023  History of Present Illness  Pt is 78 yo female admitted on 12/13/23 after fall on ramp at home found to have L femur fx.  She is s/p IM Nailing on 12/14/23.  Pt had low Hgb 3/15 and required 1 unit PRBC.  Pt with hx including but not limited to DM2, arthritis, BCC, HTN, HLD, TIA, Bil TKA, bil TSA  Clinical Impression  Pt admitted with above diagnosis. Pt seen in afternoon as she was receiving PRBC earlier. At baseline, pt is independent and ambulatory without AD.  She lives with spouse but he is unable to provide physical assist.  Today, pt in severe pain with movement despite premedication and ice. Her pain eases at rest.  She required max x 2 for transfer to EOB and mod x 2 to stand.  She was not able to take any steps and not safe to leave up in chair due to difficult transfers.  Pt currently with functional limitations due to the deficits listed below (see PT Problem List). Pt will benefit from acute skilled PT to increase their independence and safety with mobility to allow discharge.  Pt and family interested in AIR at d/c.  Pt is normally independent and has family support, hopeful for improved tolerance for activity as pain improves. Patient would benefit from further rehab at d/c.      If plan is discharge home, recommend the following: Two people to help with walking and/or transfers;Two people to help with bathing/dressing/bathroom   Can travel by private vehicle        Equipment Recommendations Rolling walker (2 wheels);Wheelchair cushion (measurements PT);Wheelchair (measurements PT);BSC/3in1  Recommendations for Other Services  Rehab consult    Functional Status Assessment Patient has had a recent decline in their functional status and demonstrates the ability to make significant improvements in function in a reasonable and predictable amount of time.      Precautions / Restrictions Precautions Precautions: Fall Restrictions Weight Bearing Restrictions Per Provider Order: Yes LLE Weight Bearing Per Provider Order: Weight bearing as tolerated      Mobility  Bed Mobility Overal bed mobility: Needs Assistance Bed Mobility: Supine to Sit, Sit to Supine     Supine to sit: Max assist, +2 for physical assistance Sit to supine: Max assist, +2 for physical assistance   General bed mobility comments: Pt needing increased time, cues for sequence/hand placement/pushing with R LE, pt putting forth good effort but still max x 2 for transfer with careful guarding of L LE.    Transfers Overall transfer level: Needs assistance Equipment used: Rolling walker (2 wheels) Transfers: Sit to/from Stand Sit to Stand: +2 physical assistance, Mod assist, From elevated surface           General transfer comment: Bed elevated, cues for hand placement and coming up on R LE for pain control.  Pt not able to take any steps, unlocked bed and slid down to get her closer to [facility].    Ambulation/Gait                  Stairs            Wheelchair Mobility     Tilt Bed    Modified Rankin (Stroke Patients Only)       Balance Overall balance assessment: Needs assistance Sitting-balance support: Bilateral upper extremity supported Sitting balance-Leahy Scale: Poor Sitting balance - Comments: Using bil LE  and leaning R but more to brace and offload L hip   Standing balance support: Bilateral upper extremity supported, Reliant on assistive device for balance Standing balance-Leahy Scale: Poor Standing balance comment: Once standing RW and min A                             Pertinent Vitals/Pain Pain Assessment Pain Assessment: 0-10 Pain Score: 10-Worst pain ever (1/10 rest; 10/10 with movement) Pain Location: L thigh Pain Descriptors / Indicators: Spasm, Shooting, Sharp Pain Intervention(s): Limited activity within  patient's tolerance, Monitored during session, Premedicated before session, Repositioned, Ice applied (Started at 1336 but had not had pain meds since 0900.  Spoke with RN to get meds and then followed up 1513 after pt had meds around 1400)    Home Living Family/patient expects to be discharged to:: Private residence Living Arrangements: Spouse/significant other Available Help at Discharge: Family;Available 24 hours/day (spouse home but not able to assist) Type of Home: House Home Access: Ramped entrance       Home Layout: One level Home Equipment: Agricultural consultant (2 wheels);Cane - single point;BSC/3in1;Shower seat      Prior Function Prior Level of Function : Independent/Modified Independent;Driving             Mobility Comments: Able to ambulate in community; does not use AD ADLs Comments: independent adls and iadls     Extremity/Trunk Assessment   Upper Extremity Assessment Upper Extremity Assessment: Overall WFL for tasks assessed    Lower Extremity Assessment Lower Extremity Assessment: LLE deficits/detail;RLE deficits/detail RLE Deficits / Details: ROM WFL; MMT at least 3/5 but not further tested due to pt bracing with R LE and leaning to R to offload L LLE Deficits / Details: Very limited by pain and spasms.  Ankle: ROM WFL, MMT 3/5 not further tested. Knee: tending to keep extended, full extension, performed gentle ROM at EOB with knee flex to ~30. Hip: ROM limited by pain but functional, MMT 1/5    Cervical / Trunk Assessment Cervical / Trunk Assessment: Normal  Communication        Cognition Arousal: Alert Behavior During Therapy: WFL for tasks assessed/performed   PT - Cognitive impairments: No apparent impairments                                 Cueing       General Comments      Exercises Total Joint Exercises Ankle Circles/Pumps: AROM, Both, 10 reps, Supine Quad Sets: AROM, Both, 5 reps, Supine (gentle contraction on L) Knee  Flexion: AAROM, Left, 5 reps, Seated Other Exercises Other Exercises: Also educated on gentle heel slides when able   Assessment/Plan    PT Assessment Patient needs continued PT services  PT Problem List Decreased strength;Decreased range of motion;Decreased activity tolerance;Decreased balance;Decreased mobility;Decreased knowledge of use of DME;Pain       PT Treatment Interventions DME instruction;Therapeutic exercise;Gait training;Balance training;Functional mobility training;Therapeutic activities;Patient/family education;Modalities;Wheelchair mobility training    PT Goals (Current goals can be found in the Care Plan section)  Acute Rehab PT Goals Patient Stated Goal: decrease pain; asked about getting into AIR PT Goal Formulation: With patient/family Time For Goal Achievement: 12/29/23 Potential to Achieve Goals: Good    Frequency Min 2X/week     Co-evaluation               AM-PAC PT "6 Clicks"  Mobility  Outcome Measure Help needed turning from your back to your side while in a flat bed without using bedrails?: Total Help needed moving from lying on your back to sitting on the side of a flat bed without using bedrails?: Total Help needed moving to and from a bed to a chair (including a wheelchair)?: Total Help needed standing up from a chair using your arms (e.g., wheelchair or bedside chair)?: Total Help needed to walk in hospital room?: Total Help needed climbing 3-5 steps with a railing? : Total 6 Click Score: 6    End of Session Equipment Utilized During Treatment: Gait belt Activity Tolerance: Patient limited by pain Patient left: in bed;with family/visitor present;with call bell/phone within reach;with bed alarm set;with SCD's reapplied (L LE elevated, heel floated, ice pack applied) Nurse Communication: Mobility status (not able to transfer OOB today, max x 2) PT Visit Diagnosis: Other abnormalities of gait and mobility (R26.89);Muscle weakness (generalized)  (M62.81);Pain Pain - Right/Left: Left Pain - part of body: Hip    Time: 1513-1540 (plus 8 mins earlier for hx and assessing pain ([id number])) PT Time Calculation (min) (ACUTE ONLY): 27 min   Charges:   PT Evaluation $PT Eval Moderate Complexity: 1 Mod PT Treatments $Therapeutic Activity: 8-22 mins PT General Charges $$ ACUTE PT VISIT: 1 Visit         Anise Salvo, PT Acute Rehab [facility] Rehab [phone]   Rayetta Humphrey 12/15/2023, 4:25 PM

## 2023-12-15 NOTE — Assessment & Plan Note (Addendum)
-   anticipated blood loss with surgery; initial Hgb 10.8 g/dL, down to 6.7 g/dL this morning; likely accurate with EBL noted and some IVF since admission. Still possible to be some lab error, but with EBL ~250 cc, prudent to go ahead and give a unit for now and repeat after transfusion - follow up Hgb after 1 unit PRBC - no major bruising noted on skin exam and LLE compartments soft; no signs of GIB at this time as well  - okay with asa for DVT ppx for now also

## 2023-12-15 NOTE — Plan of Care (Signed)
  Problem: Nutrition: Goal: Adequate nutrition will be maintained Outcome: Progressing   Problem: Clinical Measurements: Goal: Respiratory complications will improve Outcome: Progressing   Problem: Pain Managment: Goal: General experience of comfort will improve and/or be controlled Outcome: Progressing   Problem: Safety: Goal: Ability to remain free from injury will improve Outcome: Progressing

## 2023-12-15 NOTE — Progress Notes (Signed)
 Progress Note    Alexis Hopkins   DGL:[id number]  DOB: June 07, 1946  DOA: 12/13/2023     2 PCP: Alexis Raider, MD  Initial CC: fall at home  Hospital Course: [name] is a 78 year old female with PMH DM II, arthritis, BCC, HTN, HLD, TIA who presented initially to [facility] after a mechanical fall at home.  She was walking up the ramp at her house and stumbled after her foot got caught.  She was found to have left femur fracture and was transferred to [facility] for operative repair with orthopedic surgery.  Interval History:  Tolerated surgery well; resting in bed and pain much more tolerable today. No major complaints today.  Getting 1 unit PRBC and suspect PT/OT will defer evals until tomorrow.   Assessment and Plan: * Closed left hip fracture (HCC) - s/p mechanical fall walking up a ramp at home.   - Comminuted proximal left femur fracture with involvement of the subtrochanteric femur and lesser trochanter per xray read -Transferred to [facility] for orthopedic surgery evaluation and operative repair - underwent IM nail fixation on 12/14/23 - on asa for DVT ppx at this time - PT/OT evals after surgery   ABLA (acute blood loss anemia) - anticipated blood loss with surgery; initial Hgb 10.8 g/dL, down to 6.7 g/dL this morning; likely accurate with EBL noted and some IVF since admission. Still possible to be some lab error, but with EBL ~250 cc, prudent to go ahead and give a unit for now and repeat after transfusion - follow up Hgb after 1 unit PRBC - no major bruising noted on skin exam and LLE compartments soft; no signs of GIB at this time as well  - okay with asa for DVT ppx for now also   Diabetes mellitus (HCC) - A1c 6.7% on admission - Continue SSI and CBG monitoring  History of TIA (transient ischemic attack) - On aspirin and Plavix at home -Continue pravastatin  Essential hypertension - Continue amlodipine, HCTZ  Obese - Body mass index is 31.5 kg/m.    Old  records reviewed in assessment of this patient  Antimicrobials:   DVT prophylaxis:  SCDs Start: 12/14/23 1703 SCDs Start: 12/13/23 1730   Code Status:   Code Status: Full Code  Mobility Assessment (Last 72 Hours)     Mobility Assessment     Row Name 12/14/23 2022 12/14/23 0916 12/13/23 2030       Does patient have an order for bedrest or is patient medically unstable Yes- Bedfast (Level 1) - Complete Yes- Bedfast (Level 1) - Complete Yes- Bedfast (Level 1) - Complete              Barriers to discharge: none Disposition Plan:  TBD HH orders placed:  Status is: Inpt  Objective: Blood pressure (!) 137/57, pulse 90, temperature 98.2 F (36.8 C), temperature source Oral, resp. rate 20, height 5\' 2"  (1.575 m), weight 79.4 kg, SpO2 91%.  Examination:  Physical Exam Constitutional:      Appearance: Normal appearance.  HENT:     Head: Normocephalic and atraumatic.     Mouth/Throat:     Mouth: Mucous membranes are moist.  Eyes:     Extraocular Movements: Extraocular movements intact.  Cardiovascular:     Rate and Rhythm: Normal rate and regular rhythm.  Pulmonary:     Effort: Pulmonary effort is normal. No respiratory distress.     Breath sounds: Normal breath sounds. No wheezing.  Abdominal:  General: Bowel sounds are normal. There is no distension.     Palpations: Abdomen is soft.     Tenderness: There is no abdominal tenderness.  Musculoskeletal:        General: Tenderness (LLE) present.     Cervical back: Normal range of motion and neck supple.  Skin:    General: Skin is warm and dry.  Neurological:     General: No focal deficit present.     Mental Status: She is alert.  Psychiatric:        Mood and Affect: Mood normal.      Consultants:  Orthopedic surgery  Procedures:  12/14/23: LLE IM nail fixation   Data Reviewed: Results for orders placed or performed during the hospital encounter of 12/13/23 (from the past 24 hours)  Glucose, capillary      Status: Abnormal   Collection Time: 12/14/23  3:34 PM  Result Value Ref Range   Glucose-Capillary 193 (H) 70 - 99 mg/dL   Comment 1 Notify RN    Comment 2 Document in Chart   [facility] screen  [facility]     Status: None (Preliminary result)   Collection Time: 12/14/23  3:43 PM  Result Value Ref Range   ABO/RH(D) AB POS    Antibody Screen NEG    Sample Expiration 12/17/2023,2359    Unit Number [id number]    Blood Component Type RBC LR PHER2    Unit division 00    Status of Unit ISSUED    Transfusion Status OK TO TRANSFUSE    Crossmatch Result      Compatible Performed at [facility], [address], [place], [place] [address]   Glucose, capillary     Status: Abnormal   Collection Time: 12/14/23  4:56 PM  Result Value Ref Range   Glucose-Capillary 197 (H) 70 - 99 mg/dL  Glucose, capillary     Status: Abnormal   Collection Time: 12/14/23  8:43 PM  Result Value Ref Range   Glucose-Capillary 346 (H) 70 - 99 mg/dL  Basic metabolic panel     Status: Abnormal   Collection Time: 12/15/23  6:25 AM  Result Value Ref Range   Sodium 134 (L) 135 - 145 mmol/L   Potassium 4.6 3.5 - 5.1 mmol/L   Chloride 103 98 - 111 mmol/L   CO2 21 (L) 22 - 32 mmol/L   Glucose, Bld 182 (H) 70 - 99 mg/dL   BUN 46 (H) 8 - 23 mg/dL   Creatinine, Ser 7.82 (H) 0.44 - 1.00 mg/dL   Calcium 8.0 (L) 8.9 - 10.3 mg/dL   GFR, Estimated 30 (L) >60 mL/min   Anion gap 10 5 - 15  CBC with Differential/Platelet     Status: Abnormal   Collection Time: 12/15/23  6:25 AM  Result Value Ref Range   WBC 9.7 4.0 - 10.5 K/uL   RBC 2.31 (L) 3.87 - 5.11 MIL/uL   Hemoglobin 6.7 (LL) 12.0 - 15.0 g/dL   HCT 95.6 (L) 21.3 - 08.6 %   MCV 93.5 80.0 - 100.0 fL   MCH 29.0 26.0 - 34.0 pg   MCHC 31.0 30.0 - 36.0 g/dL   RDW 57.8 46.9 - 62.9 %   Platelets 171 150 - 400 K/uL   nRBC 0.0 0.0 - 0.2 %   Neutrophils Relative % 62 %   Neutro Abs 6.0 1.7 - 7.7 K/uL   Lymphocytes Relative 23 %    Lymphs Abs 2.2 0.7 - 4.0  K/uL   Monocytes Relative 14 %   Monocytes Absolute 1.4 (H) 0.1 - 1.0 K/uL   Eosinophils Relative 0 %   Eosinophils Absolute 0.0 0.0 - 0.5 K/uL   Basophils Relative 0 %   Basophils Absolute 0.0 0.0 - 0.1 K/uL   Immature Granulocytes 1 %   Abs Immature Granulocytes 0.06 0.00 - 0.07 K/uL  Magnesium     Status: None   Collection Time: 12/15/23  6:25 AM  Result Value Ref Range   Magnesium 2.0 1.7 - 2.4 mg/dL  Glucose, capillary     Status: Abnormal   Collection Time: 12/15/23  7:42 AM  Result Value Ref Range   Glucose-Capillary 177 (H) 70 - 99 mg/dL  Prepare RBC (crossmatch)     Status: None   Collection Time: 12/15/23 10:30 AM  Result Value Ref Range   Order Confirmation      ORDER PROCESSED BY BLOOD BANK Performed at [facility], [address] [place], [place] [address]   Glucose, capillary     Status: Abnormal   Collection Time: 12/15/23 11:25 AM  Result Value Ref Range   Glucose-Capillary 219 (H) 70 - 99 mg/dL    I have reviewed pertinent nursing notes, vitals, labs, and images as necessary. I have ordered labwork to follow up on as indicated.  I have reviewed the last notes from staff over past 24 hours. I have discussed patient's care plan and test results with nursing staff, CM/SW, and other staff as appropriate.  Time spent: Greater than 50% of the 55 minute visit was spent in counseling/coordination of care for the patient as laid out in the A&P.   LOS: 2 days   Lewie Chamber, MD [facility] 12/15/2023, 12:58 PM

## 2023-12-16 DIAGNOSIS — D62 Acute posthemorrhagic anemia: Secondary | ICD-10-CM | POA: Diagnosis not present

## 2023-12-16 DIAGNOSIS — S72002A Fracture of unspecified part of neck of left femur, initial encounter for closed fracture: Secondary | ICD-10-CM | POA: Diagnosis not present

## 2023-12-16 LAB — BASIC METABOLIC PANEL
Anion gap: 7 (ref 5–15)
BUN: 37 mg/dL — ABNORMAL HIGH (ref 8–23)
CO2: 23 mmol/L (ref 22–32)
Calcium: 7.9 mg/dL — ABNORMAL LOW (ref 8.9–10.3)
Chloride: 105 mmol/L (ref 98–111)
Creatinine, Ser: 1.22 mg/dL — ABNORMAL HIGH (ref 0.44–1.00)
GFR, Estimated: 46 mL/min — ABNORMAL LOW (ref >60)
Glucose, Bld: 143 mg/dL — ABNORMAL HIGH (ref 70–99)
Potassium: 4.4 mmol/L (ref 3.5–5.1)
Sodium: 135 mmol/L (ref 135–145)

## 2023-12-16 LAB — CBC WITH DIFFERENTIAL/PLATELET
Abs Immature Granulocytes: 0.07 10*3/uL (ref 0.00–0.07)
Basophils Absolute: 0 10*3/uL (ref 0.0–0.1)
Basophils Relative: 0 %
Eosinophils Absolute: 0.2 10*3/uL (ref 0.0–0.5)
Eosinophils Relative: 2 %
HCT: 22.7 % — ABNORMAL LOW (ref 36.0–46.0)
Hemoglobin: 7.1 g/dL — ABNORMAL LOW (ref 12.0–15.0)
Immature Granulocytes: 1 %
Lymphocytes Relative: 28 %
Lymphs Abs: 2.4 10*3/uL (ref 0.7–4.0)
MCH: 28.9 pg (ref 26.0–34.0)
MCHC: 31.3 g/dL (ref 30.0–36.0)
MCV: 92.3 fL (ref 80.0–100.0)
Monocytes Absolute: 1.1 10*3/uL — ABNORMAL HIGH (ref 0.1–1.0)
Monocytes Relative: 12 %
Neutro Abs: 5.1 10*3/uL (ref 1.7–7.7)
Neutrophils Relative %: 57 %
Platelets: 145 10*3/uL — ABNORMAL LOW (ref 150–400)
RBC: 2.46 MIL/uL — ABNORMAL LOW (ref 3.87–5.11)
RDW: 15 % (ref 11.5–15.5)
WBC: 8.8 10*3/uL (ref 4.0–10.5)
nRBC: 0 % (ref 0.0–0.2)

## 2023-12-16 LAB — HEMOGLOBIN AND HEMATOCRIT, BLOOD
HCT: 29 % — ABNORMAL LOW (ref 36.0–46.0)
Hemoglobin: 9.4 g/dL — ABNORMAL LOW (ref 12.0–15.0)

## 2023-12-16 LAB — GLUCOSE, CAPILLARY
Glucose-Capillary: 168 mg/dL — ABNORMAL HIGH (ref 70–99)
Glucose-Capillary: 203 mg/dL — ABNORMAL HIGH (ref 70–99)
Glucose-Capillary: 171 mg/dL — ABNORMAL HIGH (ref 70–99)
Glucose-Capillary: 206 mg/dL — ABNORMAL HIGH (ref 70–99)

## 2023-12-16 LAB — MAGNESIUM: Magnesium: 1.8 mg/dL (ref 1.7–2.4)

## 2023-12-16 LAB — PREPARE RBC (CROSSMATCH)

## 2023-12-16 MED ORDER — SODIUM CHLORIDE 0.9% IV SOLUTION: Freq: Once | INTRAVENOUS | Status: DC

## 2023-12-16 NOTE — Plan of Care (Signed)
  Problem: Safety: Goal: Ability to remain free from injury will improve Outcome: Progressing   Problem: Nutritional: Goal: Maintenance of adequate nutrition will improve Outcome: Progressing   Problem: Elimination: Goal: Will not experience complications related to bowel motility Outcome: Progressing

## 2023-12-16 NOTE — Progress Notes (Signed)
 Progress Note    KOREEN LIZAOLA   ONG:[id number]  DOB: 1945-10-17  DOA: 12/13/2023     3 PCP: Lupita Raider, MD  Initial CC: fall at home  Hospital Course: Ms. Devonne Doughty is a 78 year old female with PMH DM II, arthritis, BCC, HTN, HLD, TIA who presented initially to [facility] after a mechanical fall at home.  She was walking up the ramp at her house and stumbled after her foot got caught.  She was found to have left femur fracture and was transferred to [facility] for operative repair with orthopedic surgery.  Interval History:  Required significant assistance with PT yesterday during session.  She is hopeful to continue to advance as time goes.  Hemoglobin again down trended this morning despite blood yesterday.  No overt signs of bleeding and likely still stabilizing postop.  Getting 1 more unit of blood today.  Assessment and Plan: * Closed left hip fracture (HCC) - s/p mechanical fall walking up a ramp at home.   - Comminuted proximal left femur fracture with involvement of the subtrochanteric femur and lesser trochanter per xray read -Transferred to [facility] for orthopedic surgery evaluation and operative repair - underwent IM nail fixation on 12/14/23 - on asa for DVT ppx at this time - PT/OT evals after surgery; not a CIR candidate after evaluation  ABLA (acute blood loss anemia) - anticipated blood loss with surgery; initial Hgb 10.8 g/dL, down to 6.7 g/dL on 4/40; likely accurate with EBL noted and some IVF since admission. Still possible to be some lab error, but with EBL ~250 cc, prudent to go ahead and give a unit for now and repeat after transfusion - H/H after transfusion, 8.1 g/dL - Further downtrend again this morning to 7.1 g/dL - Getting 1 more unit PRBC and will continue trending - no major bruising noted on skin exam and LLE compartments soft; no signs of GIB at this time as well  - okay with asa for DVT ppx for now also   Diabetes mellitus (HCC) - A1c 6.7% on  admission - Continue SSI and CBG monitoring  History of TIA (transient ischemic attack) - On aspirin and Plavix at home; has been resumed postop -Continue pravastatin  Essential hypertension - Continue amlodipine, HCTZ  Obese - Body mass index is 31.5 kg/m.    Old records reviewed in assessment of this patient  Antimicrobials:   DVT prophylaxis:  SCDs Start: 12/14/23 1703 SCDs Start: 12/13/23 1730   Code Status:   Code Status: Full Code  Mobility Assessment (Last 72 Hours)     Mobility Assessment     Row Name 12/15/23 2005 12/15/23 1621 12/15/23 1025 12/14/23 2022 12/14/23 0916   Does patient have an order for bedrest or is patient medically unstable Yes- Bedfast (Level 1) - Complete -- No - Continue assessment Yes- Bedfast (Level 1) - Complete Yes- Bedfast (Level 1) - Complete   What is the highest level of mobility based on the progressive mobility assessment? -- Level 2 (Chairfast) - Balance while sitting on edge of bed and cannot stand Level 2 (Chairfast) - Balance while sitting on edge of bed and cannot stand -- --   Is the above level different from baseline mobility prior to current illness? -- -- Yes - Recommend PT order -- --    Row Name 12/13/23 2030           Does patient have an order for bedrest or is patient medically unstable Yes- Bedfast (Level 1) -  Complete                Barriers to discharge: none Disposition Plan:  TBD HH orders placed:  Status is: Inpt  Objective: Blood pressure 134/61, pulse 91, temperature 98 F (36.7 C), temperature source Oral, resp. rate 16, height 5\' 2"  (1.575 m), weight 79.4 kg, SpO2 96%.  Examination:  Physical Exam Constitutional:      Appearance: Normal appearance.  HENT:     Head: Normocephalic and atraumatic.     Mouth/Throat:     Mouth: Mucous membranes are moist.  Eyes:     Extraocular Movements: Extraocular movements intact.  Cardiovascular:     Rate and Rhythm: Normal rate and regular rhythm.   Pulmonary:     Effort: Pulmonary effort is normal. No respiratory distress.     Breath sounds: Normal breath sounds. No wheezing.  Abdominal:     General: Bowel sounds are normal. There is no distension.     Palpations: Abdomen is soft.     Tenderness: There is no abdominal tenderness.  Musculoskeletal:        General: Tenderness (LLE) present.     Cervical back: Normal range of motion and neck supple.     Comments: Expected left lower extremity swelling, compartments soft, no large bruising appreciated  Skin:    General: Skin is warm and dry.  Neurological:     General: No focal deficit present.     Mental Status: She is alert.  Psychiatric:        Mood and Affect: Mood normal.      Consultants:  Orthopedic surgery  Procedures:  12/14/23: LLE IM nail fixation   Data Reviewed: Results for orders placed or performed during the hospital encounter of 12/13/23 (from the past 24 hours)  Hemoglobin and hematocrit, blood     Status: Abnormal   Collection Time: 12/15/23  5:10 PM  Result Value Ref Range   Hemoglobin 8.1 (L) 12.0 - 15.0 g/dL   HCT 43.3 (L) 29.5 - 18.8 %  Glucose, capillary     Status: Abnormal   Collection Time: 12/15/23  5:12 PM  Result Value Ref Range   Glucose-Capillary 238 (H) 70 - 99 mg/dL  Glucose, capillary     Status: Abnormal   Collection Time: 12/15/23  8:41 PM  Result Value Ref Range   Glucose-Capillary 294 (H) 70 - 99 mg/dL  Basic metabolic panel     Status: Abnormal   Collection Time: 12/16/23  5:23 AM  Result Value Ref Range   Sodium 135 135 - 145 mmol/L   Potassium 4.4 3.5 - 5.1 mmol/L   Chloride 105 98 - 111 mmol/L   CO2 23 22 - 32 mmol/L   Glucose, Bld 143 (H) 70 - 99 mg/dL   BUN 37 (H) 8 - 23 mg/dL   Creatinine, Ser 4.16 (H) 0.44 - 1.00 mg/dL   Calcium 7.9 (L) 8.9 - 10.3 mg/dL   GFR, Estimated 46 (L) >60 mL/min   Anion gap 7 5 - 15  CBC with Differential/Platelet     Status: Abnormal   Collection Time: 12/16/23  5:23 AM  Result Value  Ref Range   WBC 8.8 4.0 - 10.5 K/uL   RBC 2.46 (L) 3.87 - 5.11 MIL/uL   Hemoglobin 7.1 (L) 12.0 - 15.0 g/dL   HCT 60.6 (L) 30.1 - 60.1 %   MCV 92.3 80.0 - 100.0 fL   MCH 28.9 26.0 - 34.0 pg   MCHC 31.3 30.0 -  36.0 g/dL   RDW 52.8 41.3 - 24.4 %   Platelets 145 (L) 150 - 400 K/uL   nRBC 0.0 0.0 - 0.2 %   Neutrophils Relative % 57 %   Neutro Abs 5.1 1.7 - 7.7 K/uL   Lymphocytes Relative 28 %   Lymphs Abs 2.4 0.7 - 4.0 K/uL   Monocytes Relative 12 %   Monocytes Absolute 1.1 (H) 0.1 - 1.0 K/uL   Eosinophils Relative 2 %   Eosinophils Absolute 0.2 0.0 - 0.5 K/uL   Basophils Relative 0 %   Basophils Absolute 0.0 0.0 - 0.1 K/uL   Immature Granulocytes 1 %   Abs Immature Granulocytes 0.07 0.00 - 0.07 K/uL  Magnesium     Status: None   Collection Time: 12/16/23  5:23 AM  Result Value Ref Range   Magnesium 1.8 1.7 - 2.4 mg/dL  Glucose, capillary     Status: Abnormal   Collection Time: 12/16/23  7:57 AM  Result Value Ref Range   Glucose-Capillary 168 (H) 70 - 99 mg/dL  Prepare RBC (crossmatch)     Status: None   Collection Time: 12/16/23  8:00 AM  Result Value Ref Range   Order Confirmation      ORDER PROCESSED BY BLOOD BANK Performed at [facility], [address], [place], [place] [address]     I have reviewed pertinent nursing notes, vitals, labs, and images as necessary. I have ordered labwork to follow up on as indicated.  I have reviewed the last notes from staff over past 24 hours. I have discussed patient's care plan and test results with nursing staff, CM/SW, and other staff as appropriate.  Time spent: Greater than 50% of the 55 minute visit was spent in counseling/coordination of care for the patient as laid out in the A&P.   LOS: 3 days   Lewie Chamber, MD [facility] 12/16/2023, 11:31 AM

## 2023-12-16 NOTE — Progress Notes (Addendum)
 Inpatient Rehab Admissions Coordinator:   Per therapy recommendations, patient was screened for CIR candidacy by Megan Salon, MS, CCC-SLP. At this time, Pt. does not appear to have a diagnosis her payor will approve for CIR. Additionally, I do not think pt. Is able to tolerate the intensity of CIR at this time, as she remains total A of 2 perople for transfers. I will not pursue a rehab consult for this Pt.   Recommend other rehab venues to be pursued.  Please contact me with any questions.  Megan Salon, MS, CCC-SLP Rehab Admissions Coordinator  [phone] (celll) [phone] (office)

## 2023-12-16 NOTE — Plan of Care (Signed)
  Problem: Education: Goal: Knowledge of General Education information will improve Description: Including pain rating scale, medication(s)/side effects and non-pharmacologic comfort measures Outcome: Progressing   Problem: Health Behavior/Discharge Planning: Goal: Ability to manage health-related needs will improve Outcome: Progressing   Problem: Clinical Measurements: Goal: Ability to maintain clinical measurements within normal limits will improve Outcome: Progressing Goal: Will remain free from infection Outcome: Progressing Goal: Diagnostic test results will improve Outcome: Progressing Goal: Respiratory complications will improve Outcome: Progressing Goal: Cardiovascular complication will be avoided Outcome: Progressing   Problem: Activity: Goal: Risk for activity intolerance will decrease Outcome: Progressing   Problem: Nutrition: Goal: Adequate nutrition will be maintained Outcome: Progressing   Problem: Coping: Goal: Level of anxiety will decrease Outcome: Progressing   Problem: Elimination: Goal: Will not experience complications related to bowel motility Outcome: Progressing Goal: Will not experience complications related to urinary retention Outcome: Progressing   Problem: Pain Managment: Goal: General experience of comfort will improve and/or be controlled Outcome: Progressing   Problem: Safety: Goal: Ability to remain free from injury will improve Outcome: Progressing   Problem: Skin Integrity: Goal: Risk for impaired skin integrity will decrease Outcome: Progressing   Problem: Education: Goal: Ability to describe self-care measures that may prevent or decrease complications (Diabetes Survival Skills Education) will improve Outcome: Progressing Goal: Individualized Educational Video(s) Outcome: Progressing   Problem: Coping: Goal: Ability to adjust to condition or change in health will improve Outcome: Progressing   Problem: Fluid  Volume: Goal: Ability to maintain a balanced intake and output will improve Outcome: Progressing   Problem: Health Behavior/Discharge Planning: Goal: Ability to identify and utilize available resources and services will improve Outcome: Progressing Goal: Ability to manage health-related needs will improve Outcome: Progressing   Problem: Metabolic: Goal: Ability to maintain appropriate glucose levels will improve Outcome: Progressing   Problem: Nutritional: Goal: Maintenance of adequate nutrition will improve Outcome: Progressing Goal: Progress toward achieving an optimal weight will improve Outcome: Progressing   Problem: Skin Integrity: Goal: Risk for impaired skin integrity will decrease Outcome: Progressing   Problem: Tissue Perfusion: Goal: Adequacy of tissue perfusion will improve Outcome: Progressing   Problem: Education: Goal: Verbalization of understanding the information provided (i.e., activity precautions, restrictions, etc) will improve Outcome: Progressing Goal: Individualized Educational Video(s) Outcome: Progressing   Problem: Clinical Measurements: Goal: Postoperative complications will be avoided or minimized Outcome: Progressing   Problem: Self-Concept: Goal: Ability to maintain and perform role responsibilities to the fullest extent possible will improve Outcome: Progressing   Problem: Activity: Goal: Ability to ambulate and perform ADLs will improve Outcome: Not Progressing   Problem: Pain Management: Goal: Pain level will decrease Outcome: Not Progressing

## 2023-12-16 NOTE — Progress Notes (Signed)
 Physical Therapy Treatment Patient Details Name: Alexis Hopkins MRN: [id number] DOB: December 05, 1945 Today's Date: 12/16/2023   History of Present Illness Pt is 78 yo female admitted on 12/13/23 after fall on ramp at home found to have L femur fx.  She is s/p IM Nailing on 12/14/23.  Pt had low Hgb 3/15 and required 1 unit PRBC, and again  7.1 g/dL on 9/62/95 and to get another 1 unit PRBCs.  Pt with hx including but not limited to DM2, arthritis, BCC, HTN, HLD, TIA, Bil TKA, bil TSA    PT Comments  Pt premedicated for session however continues to report significant L LE pain with movement.  Pt requiring total +2 assist for bed mobility and unable to stand with RW.  Pt was hoping to get to [facility] to void but unable (she feels she may have a UTI, RN notified).  Per CIR note, "does not appear to have a diagnosis her payor will approve for CIR."  Pt does not appear able to d/c home at this time.  Patient will benefit from continued inpatient follow up therapy, <3 hours/day.    If plan is discharge home, recommend the following: Two people to help with walking and/or transfers;Two people to help with bathing/dressing/bathroom   Can travel by private vehicle     No  Equipment Recommendations  Rolling walker (2 wheels);Wheelchair cushion (measurements PT);Wheelchair (measurements PT);BSC/3in1    Recommendations for Other Services       Precautions / Restrictions Precautions Precautions: Fall Restrictions Weight Bearing Restrictions Per Provider Order: No LLE Weight Bearing Per Provider Order: Weight bearing as tolerated     Mobility  Bed Mobility Overal bed mobility: Needs Assistance Bed Mobility: Supine to Sit, Sit to Supine     Supine to sit: +2 for physical assistance, Max assist Sit to supine: Total assist, +2 for physical assistance   General bed mobility comments: Pt needing increased time, cues for sequence/hand placement, pt putting forth good effort but still requiring significant  assist to perform movement due to left leg pain    Transfers Overall transfer level: Needs assistance Equipment used: Rolling walker (2 wheels) Transfers: Sit to/from Stand Sit to Stand: +2 physical assistance, From elevated surface, Total assist           General transfer comment: pt assisted with positioning in RW, pt unable to stand fully upright from elevated bed due to pain    Ambulation/Gait                   Stairs             Wheelchair Mobility     Tilt Bed    Modified Rankin (Stroke Patients Only)       Balance Overall balance assessment: Needs assistance         Standing balance support: Bilateral upper extremity supported, Reliant on assistive device for balance, During functional activity Standing balance-Leahy Scale: Zero                              Communication Communication Communication: No apparent difficulties  Cognition Arousal: Alert Behavior During Therapy: WFL for tasks assessed/performed   PT - Cognitive impairments: No apparent impairments                         Following commands: Intact      Cueing Cueing Techniques: Verbal cues  Exercises  General Comments        Pertinent Vitals/Pain Pain Assessment Pain Assessment: 0-10 Pain Score: 10-Worst pain ever Pain Location: L thigh Pain Descriptors / Indicators: Aching, Sore, Tender, Guarding, Grimacing, Moaning, Sharp Pain Intervention(s): Repositioned, Monitored during session, Limited activity within patient's tolerance, Premedicated before session    Home Living                          Prior Function            PT Goals (current goals can now be found in the care plan section) Progress towards PT goals: Progressing toward goals    Frequency    Min 2X/week      PT Plan      Co-evaluation              AM-PAC PT "6 Clicks" Mobility   Outcome Measure  Help needed turning from your back to your  side while in a flat bed without using bedrails?: Total Help needed moving from lying on your back to sitting on the side of a flat bed without using bedrails?: Total Help needed moving to and from a bed to a chair (including a wheelchair)?: Total Help needed standing up from a chair using your arms (e.g., wheelchair or bedside chair)?: Total Help needed to walk in hospital room?: Total Help needed climbing 3-5 steps with a railing? : Total 6 Click Score: 6    End of Session Equipment Utilized During Treatment: Gait belt Activity Tolerance: Patient limited by pain Patient left: in bed;with family/visitor present;with call bell/phone within reach;with bed alarm set;with SCD's reapplied Nurse Communication: Other (comment) (notified pt unable to stand or use BSC, pt feels she has a UTI) PT Visit Diagnosis: Other abnormalities of gait and mobility (R26.89);Pain Pain - Right/Left: Left Pain - part of body: Hip     Time: 1130-1158 PT Time Calculation (min) (ACUTE ONLY): 28 min  Charges:    $Therapeutic Activity: 23-37 mins PT General Charges $$ ACUTE PT VISIT: 1 Visit                     Thomasene Mohair PT, DPT Physical Therapist Acute Rehabilitation Services Office: [phone]    Kati L Payson 12/16/2023, 1:17 PM

## 2023-12-17 ENCOUNTER — Encounter (HOSPITAL_COMMUNITY): Payer: Self-pay | Admitting: Orthopedic Surgery

## 2023-12-17 DIAGNOSIS — D62 Acute posthemorrhagic anemia: Secondary | ICD-10-CM | POA: Diagnosis not present

## 2023-12-17 DIAGNOSIS — S72002A Fracture of unspecified part of neck of left femur, initial encounter for closed fracture: Secondary | ICD-10-CM | POA: Diagnosis not present

## 2023-12-17 DIAGNOSIS — R338 Other retention of urine: Secondary | ICD-10-CM

## 2023-12-17 LAB — GLUCOSE, CAPILLARY
Glucose-Capillary: 163 mg/dL — ABNORMAL HIGH (ref 70–99)
Glucose-Capillary: 226 mg/dL — ABNORMAL HIGH (ref 70–99)
Glucose-Capillary: 273 mg/dL — ABNORMAL HIGH (ref 70–99)
Glucose-Capillary: 184 mg/dL — ABNORMAL HIGH (ref 70–99)

## 2023-12-17 LAB — CBC WITH DIFFERENTIAL/PLATELET
Abs Immature Granulocytes: 0.06 10*3/uL (ref 0.00–0.07)
Basophils Absolute: 0 10*3/uL (ref 0.0–0.1)
Basophils Relative: 0 %
Eosinophils Absolute: 0.2 10*3/uL (ref 0.0–0.5)
Eosinophils Relative: 2 %
HCT: 27.4 % — ABNORMAL LOW (ref 36.0–46.0)
Hemoglobin: 8.7 g/dL — ABNORMAL LOW (ref 12.0–15.0)
Immature Granulocytes: 1 %
Lymphocytes Relative: 27 %
Lymphs Abs: 2.4 10*3/uL (ref 0.7–4.0)
MCH: 29.2 pg (ref 26.0–34.0)
MCHC: 31.8 g/dL (ref 30.0–36.0)
MCV: 91.9 fL (ref 80.0–100.0)
Monocytes Absolute: 0.9 10*3/uL (ref 0.1–1.0)
Monocytes Relative: 11 %
Neutro Abs: 5.3 10*3/uL (ref 1.7–7.7)
Neutrophils Relative %: 59 %
Platelets: 168 10*3/uL (ref 150–400)
RBC: 2.98 MIL/uL — ABNORMAL LOW (ref 3.87–5.11)
RDW: 15 % (ref 11.5–15.5)
WBC: 8.9 10*3/uL (ref 4.0–10.5)
nRBC: 0 % (ref 0.0–0.2)

## 2023-12-17 LAB — TYPE AND SCREEN
ABO/RH(D): AB POS
Antibody Screen: NEGATIVE
Unit division: 0
Unit division: 0

## 2023-12-17 LAB — BASIC METABOLIC PANEL
Anion gap: 8 (ref 5–15)
BUN: 32 mg/dL — ABNORMAL HIGH (ref 8–23)
CO2: 24 mmol/L (ref 22–32)
Calcium: 8.7 mg/dL — ABNORMAL LOW (ref 8.9–10.3)
Chloride: 103 mmol/L (ref 98–111)
Creatinine, Ser: 1.09 mg/dL — ABNORMAL HIGH (ref 0.44–1.00)
GFR, Estimated: 53 mL/min — ABNORMAL LOW (ref >60)
Glucose, Bld: 153 mg/dL — ABNORMAL HIGH (ref 70–99)
Potassium: 4.3 mmol/L (ref 3.5–5.1)
Sodium: 135 mmol/L (ref 135–145)

## 2023-12-17 LAB — BPAM RBC
Blood Product Expiration Date: 202504182359
Blood Product Expiration Date: 202504072359
ISSUE DATE / TIME: 202503161212
ISSUE DATE / TIME: 202503151242
Unit Type and Rh: 6200
Unit Type and Rh: 6200

## 2023-12-17 LAB — MAGNESIUM: Magnesium: 2 mg/dL (ref 1.7–2.4)

## 2023-12-17 NOTE — Progress Notes (Signed)
 Progress Note    Alexis Hopkins   ONG:[id number]  DOB: 1946/05/14  DOA: 12/13/2023     4 PCP: Lupita Raider, MD  Initial CC: fall at home  Hospital Course: [name] is a 78 year old female with PMH DM II, arthritis, BCC, HTN, HLD, TIA who presented initially to [facility] after a mechanical fall at home.  She was walking up the ramp at her house and stumbled after her foot got caught.  She was found to have left femur fracture and was transferred to [facility] for operative repair with orthopedic surgery.  Interval History:  Seems a little bit more comfortable today in terms of pain control.  Still unable to do much with therapy.  Failed voiding trial yesterday and Foley required being replaced.  Discussed she will need to progress a little bit more in terms of her mobility and pain control before reattempting Foley removal; she understands.  Pursuing rehab placement at this time.  Assessment and Plan: * Closed left hip fracture (HCC) - s/p mechanical fall walking up a ramp at home.   - Comminuted proximal left femur fracture with involvement of the subtrochanteric femur and lesser trochanter per xray read -Transferred to [facility] for orthopedic surgery evaluation and operative repair - underwent IM nail fixation on 12/14/23 - on asa for DVT ppx at this time - PT/OT evals after surgery; not a CIR candidate after evaluation; now pending SNF workup  ABLA (acute blood loss anemia) - anticipated blood loss with surgery; initial Hgb 10.8 g/dL, down to 6.7 g/dL on 4/40; likely accurate with EBL noted and some IVF since admission. Still possible to be some lab error, but with EBL ~250 cc, prudent to go ahead and give a unit for now and repeat after transfusion - H/H after transfusion, 8.1 g/dL - Further downtrend again on 3/16 to 7.1 g/dL; s/p 1 more unit - Hgb ~ stable, 8.7 g/dL; will need to trend still - no major bruising noted on skin exam and LLE compartments soft; no signs of GIB at  this time as well  - okay with asa for DVT ppx for now also   Diabetes mellitus (HCC) - A1c 6.7% on admission - Continue SSI and CBG monitoring  Acute urinary retention - Failed void trial on 12/16/2023 after removal; had over 500 cc on bladder scan -Foley replaced on 12/16/2023; patient will need to regain much more movement and being able to get out of bed with therapy before next retrial; bladder also needs time to rest after severe distention  History of TIA (transient ischemic attack) - On aspirin and Plavix at home; has been resumed postop -Continue pravastatin  Essential hypertension - Continue amlodipine, HCTZ  Obese - Body mass index is 31.5 kg/m.    Old records reviewed in assessment of this patient  Antimicrobials:   DVT prophylaxis:  SCDs Start: 12/14/23 1703 SCDs Start: 12/13/23 1730   Code Status:   Code Status: Full Code  Mobility Assessment (Last 72 Hours)     Mobility Assessment     Row Name 12/17/23 0705 12/16/23 2253 12/16/23 2000 12/16/23 1313 12/16/23 0945   Does patient have an order for bedrest or is patient medically unstable Yes- Bedfast (Level 1) - Complete Yes- Bedfast (Level 1) - Complete Yes- Bedfast (Level 1) - Complete -- No - Continue assessment   What is the highest level of mobility based on the progressive mobility assessment? Level 1 (Bedfast) - Unable to balance while sitting on edge  of bed Level 1 (Bedfast) - Unable to balance while sitting on edge of bed -- Level 2 (Chairfast) - Balance while sitting on edge of bed and cannot stand Level 2 (Chairfast) - Balance while sitting on edge of bed and cannot stand   Is the above level different from baseline mobility prior to current illness? Yes - Recommend PT order Yes - Recommend PT order -- -- Yes - Recommend PT order    Row Name 12/15/23 2005 12/15/23 1621 12/15/23 1025 12/14/23 2022     Does patient have an order for bedrest or is patient medically unstable Yes- Bedfast (Level 1) -  Complete -- No - Continue assessment Yes- Bedfast (Level 1) - Complete    What is the highest level of mobility based on the progressive mobility assessment? -- Level 2 (Chairfast) - Balance while sitting on edge of bed and cannot stand Level 2 (Chairfast) - Balance while sitting on edge of bed and cannot stand --    Is the above level different from baseline mobility prior to current illness? -- -- Yes - Recommend PT order --             Barriers to discharge: none Disposition Plan:  TBD HH orders placed:  Status is: Inpt  Objective: Blood pressure (!) 142/70, pulse 99, temperature 98.7 F (37.1 C), temperature source Oral, resp. rate 18, height 5\' 2"  (1.575 m), weight 79.4 kg, SpO2 93%.  Examination:  Physical Exam Constitutional:      Appearance: Normal appearance.  HENT:     Head: Normocephalic and atraumatic.     Mouth/Throat:     Mouth: Mucous membranes are moist.  Eyes:     Extraocular Movements: Extraocular movements intact.  Cardiovascular:     Rate and Rhythm: Normal rate and regular rhythm.  Pulmonary:     Effort: Pulmonary effort is normal. No respiratory distress.     Breath sounds: Normal breath sounds. No wheezing.  Abdominal:     General: Bowel sounds are normal. There is no distension.     Palpations: Abdomen is soft.     Tenderness: There is no abdominal tenderness.  Musculoskeletal:        General: Tenderness (LLE) present.     Cervical back: Normal range of motion and neck supple.     Comments: Expected left lower extremity swelling, compartments soft, no large bruising appreciated  Skin:    General: Skin is warm and dry.  Neurological:     General: No focal deficit present.     Mental Status: She is alert.  Psychiatric:        Mood and Affect: Mood normal.      Consultants:  Orthopedic surgery  Procedures:  12/14/23: LLE IM nail fixation   Data Reviewed: Results for orders placed or performed during the hospital encounter of 12/13/23 (from  the past 24 hours)  Hemoglobin and hematocrit, blood     Status: Abnormal   Collection Time: 12/16/23  4:53 PM  Result Value Ref Range   Hemoglobin 9.4 (L) 12.0 - 15.0 g/dL   HCT 54.0 (L) 98.1 - 19.1 %  Glucose, capillary     Status: Abnormal   Collection Time: 12/16/23  5:43 PM  Result Value Ref Range   Glucose-Capillary 171 (H) 70 - 99 mg/dL  Glucose, capillary     Status: Abnormal   Collection Time: 12/16/23  8:25 PM  Result Value Ref Range   Glucose-Capillary 206 (H) 70 - 99 mg/dL  Comment 1 Document in Chart   Basic metabolic panel     Status: Abnormal   Collection Time: 12/17/23  4:46 AM  Result Value Ref Range   Sodium 135 135 - 145 mmol/L   Potassium 4.3 3.5 - 5.1 mmol/L   Chloride 103 98 - 111 mmol/L   CO2 24 22 - 32 mmol/L   Glucose, Bld 153 (H) 70 - 99 mg/dL   BUN 32 (H) 8 - 23 mg/dL   Creatinine, Ser 1.61 (H) 0.44 - 1.00 mg/dL   Calcium 8.7 (L) 8.9 - 10.3 mg/dL   GFR, Estimated 53 (L) >60 mL/min   Anion gap 8 5 - 15  CBC with Differential/Platelet     Status: Abnormal   Collection Time: 12/17/23  4:46 AM  Result Value Ref Range   WBC 8.9 4.0 - 10.5 K/uL   RBC 2.98 (L) 3.87 - 5.11 MIL/uL   Hemoglobin 8.7 (L) 12.0 - 15.0 g/dL   HCT 09.6 (L) 04.5 - 40.9 %   MCV 91.9 80.0 - 100.0 fL   MCH 29.2 26.0 - 34.0 pg   MCHC 31.8 30.0 - 36.0 g/dL   RDW 81.1 91.4 - 78.2 %   Platelets 168 150 - 400 K/uL   nRBC 0.0 0.0 - 0.2 %   Neutrophils Relative % 59 %   Neutro Abs 5.3 1.7 - 7.7 K/uL   Lymphocytes Relative 27 %   Lymphs Abs 2.4 0.7 - 4.0 K/uL   Monocytes Relative 11 %   Monocytes Absolute 0.9 0.1 - 1.0 K/uL   Eosinophils Relative 2 %   Eosinophils Absolute 0.2 0.0 - 0.5 K/uL   Basophils Relative 0 %   Basophils Absolute 0.0 0.0 - 0.1 K/uL   Immature Granulocytes 1 %   Abs Immature Granulocytes 0.06 0.00 - 0.07 K/uL  Magnesium     Status: None   Collection Time: 12/17/23  4:46 AM  Result Value Ref Range   Magnesium 2.0 1.7 - 2.4 mg/dL  Glucose, capillary      Status: Abnormal   Collection Time: 12/17/23  8:01 AM  Result Value Ref Range   Glucose-Capillary 163 (H) 70 - 99 mg/dL  Glucose, capillary     Status: Abnormal   Collection Time: 12/17/23 11:58 AM  Result Value Ref Range   Glucose-Capillary 226 (H) 70 - 99 mg/dL   Comment 1 Notify RN    Comment 2 Document in Chart     I have reviewed pertinent nursing notes, vitals, labs, and images as necessary. I have ordered labwork to follow up on as indicated.  I have reviewed the last notes from staff over past 24 hours. I have discussed patient's care plan and test results with nursing staff, CM/SW, and other staff as appropriate.  Time spent: Greater than 50% of the 55 minute visit was spent in counseling/coordination of care for the patient as laid out in the A&P.   LOS: 4 days   Lewie Chamber, MD [facility] 12/17/2023, 2:48 PM

## 2023-12-17 NOTE — Progress Notes (Signed)
 Physical Therapy Treatment Patient Details Name: MINNETTE MERIDA MRN: [id number] DOB: 12/01/1945 Today's Date: 12/17/2023   History of Present Illness Pt is 78 yo female admitted on 12/13/23 after fall on ramp at home found to have L femur fx.  She is s/p IM Nailing on 12/14/23.  Pt had low Hgb 3/15 and required 1 unit PRBC, and again  7.1 g/dL on 05/31/55 and to get another 1 unit PRBCs.  Pt with hx including but not limited to DM2, arthritis, BCC, HTN, HLD, TIA, Bil TKA, bil TSA    PT Comments  POD # 3 PT - Cognition Comments: AxO x 3 pleasant Lady who lives home with spouse when she was walking up her RAMP carrying multiple items when she fell.  Two daughters present during session.  Supportive. Assisted OOB to amb was difficult and required + 2 assist.  General bed mobility comments: Pt required + 2 Max Asisst to transition to EOB using bed pad to complete.  Increased pain with activity and decreased weightshift/sitting tolerance onto LEFT.  HR 109.  Sat EOB x 4 min.  No dizziness.  HgB 8.7 today. General transfer comment: Pt required + 2 Max side by side assist to rise from elevated with poor self WBing tolerance LEFT LE.  Overall, pt performed three sit to stands with VC's on proper hand placement. General Gait Details: VERY limited amb distance of 2 feet + 3 feet with recliner following closely behind.  Great difficulty advancing eityh LE die to self limited weight shift onto Pt's LEFT (pain).  Placed a dry wash cloth under Pt's LEFT foot so she could "slide" her L LE forward.  Pt was still unable so Therapist assisted with L LE advancement with each step.  Also, Pt required increased axillary support as well as assistance with weightshifting so that she was able to "quickly" step forward with her RIGHT.  Pt required a seated rest break with each amb attempt.  Pt would benefit from a YOUTH walker.  Daughter assisted by following with recliner. Issued Pt a gait belt to be used for TE's.  Pt was  familiar with these having had a TKR not to long ago.  Educated/Instructed on AP, knee presses, leg raises, heels slides and ABd/ADd.  Also educated on use of ICE. Pt will need ST Rehab at SNF to address mobility and functional decline prior to safely returning home.    If plan is discharge home, recommend the following: Two people to help with walking and/or transfers;Two people to help with bathing/dressing/bathroom   Can travel by private vehicle     No  Equipment Recommendations  Rolling walker (2 wheels);Wheelchair cushion (measurements PT);Wheelchair (measurements PT);BSC/3in1    Recommendations for Other Services       Precautions / Restrictions Precautions Precautions: Fall Precaution/Restrictions Comments: Acute Blood loss Anemia Restrictions Weight Bearing Restrictions Per Provider Order: No LLE Weight Bearing Per Provider Order: Weight bearing as tolerated     Mobility  Bed Mobility Overal bed mobility: Needs Assistance Bed Mobility: Supine to Sit     Supine to sit: +2 for physical assistance, Max assist     General bed mobility comments: Pt required + 2 Max Asisst to transition to EOB using bed pad to complete.  Increased pain with activity and decreased weightshift/sitting tolerance onto LEFT.  HR 109.  Sat EOB x 4 min.  No dizziness.  HgB 8.7 today.    Transfers Overall transfer level: Needs assistance Equipment used: Rolling walker (2  wheels) Transfers: Sit to/from Stand Sit to Stand: +2 physical assistance, From elevated surface, Max assist           General transfer comment: Pt required + 2 Max side by side assist to rise from elevated with poor self WBing tolerance LEFT LE.  Overall, pt performed three sit to stands with VC's on proper hand placement.    Ambulation/Gait   Gait Distance (Feet): 5 Feet (2 feet, 3 feet) Assistive device: Rolling walker (2 wheels) Gait Pattern/deviations: Step-to pattern, Decreased step length - right, Decreased step  length - left, Decreased stance time - left Gait velocity: decreased     General Gait Details: VERY limited amb distance of 2 feet + 3 feet with recliner following closely behind.  Great difficulty advancing eityh LE die to self limited weight shift onto Pt's LEFT (pain).  Placed a dry wash cloth under Pt's LEFT foot so she could "slide" her L LE forward.  Pt was still unable so Therapist assisted with L LE advancement with each step.  Also, Pt required increased axillary support as well as assistance with weightshifting so that she was able to "quickly" step forward with her RIGHT.  Pt required a seated rest break with each amb attempt.  Pt would benefit from a YOUTH walker.  Daughter assisted by following with recliner.   Stairs             Wheelchair Mobility     Tilt Bed    Modified Rankin (Stroke Patients Only)       Balance                                            Communication Communication Communication: No apparent difficulties  Cognition Arousal: Alert Behavior During Therapy: WFL for tasks assessed/performed   PT - Cognitive impairments: No apparent impairments                       PT - Cognition Comments: AxO x 3 pleasant Lady who lives home with spouse when she was walking up her RAMP carrying multiple items when she fell.  Two daughters present during session.  Supportive. Following commands: Intact      Cueing Cueing Techniques: Verbal cues  Exercises  AP, knee presses, Gluteal squeezes, heel slides, leg raises    General Comments        Pertinent Vitals/Pain Pain Assessment Pain Assessment: 0-10 Pain Score: 8  Pain Location: L thigh/hip with activity Pain Descriptors / Indicators: Sore, Tender, Guarding, Grimacing, Sharp, Operative site guarding Pain Intervention(s): Monitored during session, Premedicated before session, Repositioned, Ice applied    Home Living                          Prior Function             PT Goals (current goals can now be found in the care plan section) Progress towards PT goals: Progressing toward goals    Frequency    Min 2X/week      PT Plan      Co-evaluation              AM-PAC PT "6 Clicks" Mobility   Outcome Measure  Help needed turning from your back to your side while in a flat bed without using bedrails?: A Lot Help needed  moving from lying on your back to sitting on the side of a flat bed without using bedrails?: A Lot Help needed moving to and from a bed to a chair (including a wheelchair)?: A Lot Help needed standing up from a chair using your arms (e.g., wheelchair or bedside chair)?: A Lot Help needed to walk in hospital room?: A Lot Help needed climbing 3-5 steps with a railing? : Total 6 Click Score: 11    End of Session Equipment Utilized During Treatment: Gait belt Activity Tolerance: Patient limited by pain Patient left: in chair;with call bell/phone within reach;with family/visitor present Nurse Communication: Mobility status PT Visit Diagnosis: Other abnormalities of gait and mobility (R26.89);Pain Pain - Right/Left: Left Pain - part of body: Hip     Time: 6578-4696 PT Time Calculation (min) (ACUTE ONLY): 24 min  Charges:    $Gait Training: 8-22 mins $Therapeutic Activity: 8-22 mins PT General Charges $$ ACUTE PT VISIT: 1 Visit                    Felecia Shelling  PTA Acute  Rehabilitation Services Office M-F          [phone]

## 2023-12-17 NOTE — Assessment & Plan Note (Addendum)
-   Failed void trial on 12/16/2023 after removal; had over 500 cc on bladder scan -Foley replaced on 12/16/2023; patient will need to regain much more movement and being able to get out of bed with therapy before next retrial; bladder also needs time to rest after severe distention - very slow PT progression and suspect high chance of ongoing retention; repeat TOV in another 1-2 days

## 2023-12-17 NOTE — NC FL2 (Signed)
 [place] MEDICAID FL2 LEVEL OF CARE FORM     IDENTIFICATION  Patient Name: Alexis Hopkins Birthdate: 07-13-1946 Sex: female Admission Date (Current Location): 12/13/2023  [facility] and [place] Number:  Producer, television/film/video and Address:  [facility],  [address] [place], [place] [address]      Provider Number: [id number]  Attending Physician Name and Address:  Lewie Chamber, MD  Relative Name and Phone Number:  Alexis Hopkins (Spouse)  [phone]    Current Level of Care: Hospital Recommended Level of Care: Skilled Nursing Facility Prior Approval Number:    Date Approved/Denied:   PASRR Number: [id number]  Discharge Plan: SNF    Current Diagnoses: Patient Active Problem List   Diagnosis Date Noted   ABLA (acute blood loss anemia) 12/15/2023   Closed left hip fracture (HCC) 12/13/2023   Essential hypertension 12/13/2023   Diabetes mellitus (HCC) 12/13/2023   History of TIA (transient ischemic attack) 12/13/2023   H/O total shoulder replacement, left 07/21/2021   Obese 03/10/2015   S/P left TKA 03/09/2015   S/P right TKA 10/05/2014   Arthritis of shoulder region, right, degenerative 10/03/2013    Orientation RESPIRATION BLADDER Height & Weight     Self, Time, Situation, Place  Normal Continent, Indwelling catheter Weight: 175 lb (79.4 kg) Height:  5\' 2"  (157.5 cm)  BEHAVIORAL SYMPTOMS/MOOD NEUROLOGICAL BOWEL NUTRITION STATUS      Continent Diet (See discharge summary)  AMBULATORY STATUS COMMUNICATION OF NEEDS Skin   Total Care Verbally Normal                       Personal Care Assistance Level of Assistance  Bathing, Feeding, Dressing Bathing Assistance: Maximum assistance Feeding assistance: Limited assistance Dressing Assistance: Maximum assistance     Functional Limitations Info  Sight, Hearing, Speech Sight Info: Impaired Hearing Info: Impaired Speech Info: Adequate    SPECIAL CARE FACTORS FREQUENCY  PT (By licensed PT), OT  (By licensed OT)     PT Frequency: 5x/wk OT Frequency: 5x/wk            Contractures Contractures Info: Not present    Additional Factors Info  Code Status, Allergies Code Status Info: FULL Allergies Info: Motrin (Ibuprofen), Shellfish Allergy           Current Medications (12/17/2023):  This is the current hospital active medication list Current Facility-Administered Medications  Medication Dose Route Frequency Provider Last Rate Last Admin   0.9 %  sodium chloride infusion (Manually program via Guardrails IV Fluids)   Intravenous Once Lewie Chamber, MD       0.9 %  sodium chloride infusion (Manually program via Guardrails IV Fluids)   Intravenous Once Lewie Chamber, MD       acetaminophen (TYLENOL) tablet 325-650 mg  325-650 mg Oral Q6H PRN Hill, Denny Peon S, PA-C       amLODipine (NORVASC) tablet 10 mg  10 mg Oral q morning Clois Dupes, PA-C   10 mg at 12/17/23 0102   aspirin EC tablet 325 mg  325 mg Oral Daily Clois Dupes, [place]   325 mg at 12/17/23 7253   B-complex with vitamin C tablet 1 tablet  1 tablet Oral Daily Clois Dupes, [place]   1 tablet at 12/17/23 6644   bisacodyl (DULCOLAX) suppository 10 mg  10 mg Rectal Daily PRN Clint Bolder S, PA-C       calcium-vitamin D (OSCAL WITH D) 500-5 MG-MCG per tablet 1 tablet  1  tablet Oral Q breakfast Clois Dupes, [place]   1 tablet at 12/17/23 4132   Chlorhexidine Gluconate Cloth 2 % PADS 6 each  6 each Topical Daily Lewie Chamber, MD   6 each at 12/17/23 4401   clopidogrel (PLAVIX) tablet 75 mg  75 mg Oral Q breakfast Clois Dupes, [place]   75 mg at 12/17/23 0272   colesevelam [facility]) tablet 1,250 mg  1,250 mg Oral BID WC Clois Dupes, PA-C   1,250 mg at 12/17/23 5366   docusate sodium (COLACE) capsule 100 mg  100 mg Oral BID Clois Dupes, PA-C   100 mg at 12/17/23 4403   feeding supplement (GLUCERNA SHAKE) (GLUCERNA SHAKE) liquid 237 mL  237 mL Oral BID BM HillAlain Honey, PA-C   237 mL at 12/16/23 1500   hydrochlorothiazide  (HYDRODIURIL) tablet 12.5 mg  12.5 mg Oral q morning Clois Dupes, PA-C   12.5 mg at 12/17/23 4742   HYDROcodone-acetaminophen (NORCO) 7.5-325 MG per tablet 1-2 tablet  1-2 tablet Oral Q4H PRN Clois Dupes, PA-C   2 tablet at 12/16/23 5956   HYDROcodone-acetaminophen (NORCO/VICODIN) 5-325 MG per tablet 1-2 tablet  1-2 tablet Oral Q4H PRN Clois Dupes, PA-C   2 tablet at 12/17/23 3875   insulin aspart (novoLOG) injection 0-15 Units  0-15 Units Subcutaneous TID WC Clois Dupes, PA-C   3 Units at 12/17/23 6433   insulin aspart (novoLOG) injection 0-5 Units  0-5 Units Subcutaneous QHS Clois Dupes, [place]   2 Units at 12/16/23 2155   lip balm (CARMEX) ointment 1 Application  1 Application Topical PRN Clois Dupes, PA-C       menthol-cetylpyridinium (CEPACOL) lozenge 3 mg  1 lozenge Oral PRN Clint Bolder S, PA-C       Or   phenol (CHLORASEPTIC) mouth spray 1 spray  1 spray Mouth/Throat PRN Hill, Alain Honey, PA-C       methocarbamol (ROBAXIN) tablet 500 mg  500 mg Oral Q6H PRN Clois Dupes, PA-C   500 mg at 12/16/23 2152   Or   methocarbamol (ROBAXIN) injection 500 mg  500 mg Intravenous Q6H PRN Hill, Denny Peon S, PA-C       metoCLOPramide (REGLAN) tablet 5-10 mg  5-10 mg Oral Q8H PRN Hill, Avery S, PA-C       Or   metoCLOPramide (REGLAN) injection 5-10 mg  5-10 mg Intravenous Q8H PRN Hill, Avery S, PA-C       morphine (PF) 2 MG/ML injection 0.5-1 mg  0.5-1 mg Intravenous Q2H PRN Clint Bolder S, PA-C       multivitamin with minerals tablet 1 tablet  1 tablet Oral q morning Clois Dupes, [place]   1 tablet at 12/17/23 2951   mupirocin ointment (BACTROBAN) 2 % 1 Application  1 Application Nasal BID Clois Dupes, [place]   1 Application at 12/17/23 0834   ondansetron (ZOFRAN) tablet 4 mg  4 mg Oral Q6H PRN Clint Bolder S, PA-C       Or   ondansetron (ZOFRAN) injection 4 mg  4 mg Intravenous Q6H PRN Hill, Avery S, PA-C       polyethylene glycol (MIRALAX / GLYCOLAX) packet 17 g  17 g Oral Daily PRN Clois Dupes,  PA-C   17 g at 12/16/23 2151   pravastatin (PRAVACHOL) tablet 40 mg  40 mg Oral QPC supper Clois Dupes, PA-C   40 mg at 12/16/23 8841  Discharge Medications: Please see discharge summary for a list of discharge medications.  Relevant Imaging Results:  Relevant Lab Results:   Additional Information SSN: [id number]  Otelia Santee, LCSW

## 2023-12-17 NOTE — Progress Notes (Signed)
    Subjective:  Patient reports pain as moderate.  Denies N/V/CP/SOB/Abd pain. She reports difficulty with pain control with PT. She has not been able to ambulate yet. We discussed some exercise to do in bed if tolerated. She denies any tingling or numbness in LE bilaterally.  Foley catheter in place.   Objective:   VITALS:   Vitals:   12/16/23 1355 12/16/23 1423 12/16/23 1941 12/17/23 0541  BP: (!) 157/69 (!) 154/68 (!) 145/61 (!) 140/75  Pulse: 96 99 100 83  Resp: 16 16 18 18   Temp: 99 F (37.2 C) 98.6 F (37 C) 98.2 F (36.8 C) 98.3 F (36.8 C)  TempSrc: Oral Oral  Oral  SpO2: 91% 91% 96% 93%  Weight:      Height:        Patient lying in bed. NAD Neurologically intact ABD soft Neurovascular intact Sensation intact distally Intact pulses distally Dorsiflexion/Plantar flexion intact No cellulitis present Compartment soft Incisions C/D/I x3.     Lab Results  Component Value Date   WBC 8.9 12/17/2023   HGB 8.7 (L) 12/17/2023   HCT 27.4 (L) 12/17/2023   MCV 91.9 12/17/2023   PLT 168 12/17/2023   BMET    Component Value Date/Time   NA 135 12/17/2023 0446   K 4.3 12/17/2023 0446   CL 103 12/17/2023 0446   CO2 24 12/17/2023 0446   GLUCOSE 153 (H) 12/17/2023 0446   BUN 32 (H) 12/17/2023 0446   CREATININE 1.09 (H) 12/17/2023 0446   CALCIUM 8.7 (L) 12/17/2023 0446   GFRNONAA 53 (L) 12/17/2023 0446     Assessment/Plan: 3 Days Post-Op   Principal Problem:   Closed left hip fracture (HCC) Active Problems:   Obese   Essential hypertension   Diabetes mellitus (HCC)   History of TIA (transient ischemic attack)   ABLA (acute blood loss anemia)  ABLA. Hemoglobin 8.7, continue to monitor.   WBAT with walker DVT ppx: Aspirin and plavix , SCDs, TEDS PO pain control PT/OT: Very slow progression with PT. Continue PT today.  Dispo: Patient under care of the medical team, disposition per their recommendation. Pain medication printed in chart. Aspirin along  with baseline plavix for DVT ppx.    Clois Dupes 12/17/2023, 12:30 PM   [facility]  [place] [address], [address], [place], [place] [address] Phone: [phone] [email or web address] Facebook  [employer]

## 2023-12-17 NOTE — Plan of Care (Signed)
  Problem: Education: Goal: Knowledge of General Education information will improve Description: Including pain rating scale, medication(s)/side effects and non-pharmacologic comfort measures Outcome: Progressing   Problem: Health Behavior/Discharge Planning: Goal: Ability to manage health-related needs will improve Outcome: Progressing   Problem: Clinical Measurements: Goal: Ability to maintain clinical measurements within normal limits will improve Outcome: Progressing Goal: Will remain free from infection Outcome: Progressing Goal: Diagnostic test results will improve Outcome: Progressing Goal: Respiratory complications will improve Outcome: Progressing Goal: Cardiovascular complication will be avoided Outcome: Progressing   Problem: Activity: Goal: Risk for activity intolerance will decrease Outcome: Progressing   Problem: Nutrition: Goal: Adequate nutrition will be maintained Outcome: Progressing   Problem: Coping: Goal: Level of anxiety will decrease Outcome: Progressing   Problem: Elimination: Goal: Will not experience complications related to bowel motility Outcome: Progressing Goal: Will not experience complications related to urinary retention Outcome: Progressing   Problem: Pain Managment: Goal: General experience of comfort will improve and/or be controlled Outcome: Progressing   Problem: Safety: Goal: Ability to remain free from injury will improve Outcome: Progressing   Problem: Skin Integrity: Goal: Risk for impaired skin integrity will decrease Outcome: Progressing   Problem: Education: Goal: Ability to describe self-care measures that may prevent or decrease complications (Diabetes Survival Skills Education) will improve Outcome: Progressing   Problem: Coping: Goal: Ability to adjust to condition or change in health will improve Outcome: Progressing   Problem: Fluid Volume: Goal: Ability to maintain a balanced intake and output will  improve Outcome: Progressing   Problem: Health Behavior/Discharge Planning: Goal: Ability to identify and utilize available resources and services will improve Outcome: Progressing Goal: Ability to manage health-related needs will improve Outcome: Progressing   Problem: Metabolic: Goal: Ability to maintain appropriate glucose levels will improve Outcome: Progressing   Problem: Nutritional: Goal: Maintenance of adequate nutrition will improve Outcome: Progressing Goal: Progress toward achieving an optimal weight will improve Outcome: Progressing   Problem: Skin Integrity: Goal: Risk for impaired skin integrity will decrease Outcome: Progressing   Problem: Tissue Perfusion: Goal: Adequacy of tissue perfusion will improve Outcome: Progressing   Problem: Education: Goal: Verbalization of understanding the information provided (i.e., activity precautions, restrictions, etc) will improve Outcome: Progressing   Problem: Activity: Goal: Ability to ambulate and perform ADLs will improve Outcome: Progressing   Problem: Clinical Measurements: Goal: Postoperative complications will be avoided or minimized Outcome: Progressing   Problem: Self-Concept: Goal: Ability to maintain and perform role responsibilities to the fullest extent possible will improve Outcome: Progressing   Problem: Pain Management: Goal: Pain level will decrease Outcome: Progressing

## 2023-12-17 NOTE — Plan of Care (Signed)
  Problem: Education: Goal: Knowledge of General Education information will improve Description: Including pain rating scale, medication(s)/side effects and non-pharmacologic comfort measures Outcome: Progressing   Problem: Elimination: Goal: Will not experience complications related to urinary retention Outcome: Progressing   Problem: Safety: Goal: Ability to remain free from injury will improve Outcome: Progressing   Problem: Nutritional: Goal: Maintenance of adequate nutrition will improve Outcome: Progressing   Problem: Tissue Perfusion: Goal: Adequacy of tissue perfusion will improve Outcome: Progressing   Problem: Education: Goal: Verbalization of understanding the information provided (i.e., activity precautions, restrictions, etc) will improve Outcome: Progressing   Problem: Clinical Measurements: Goal: Postoperative complications will be avoided or minimized Outcome: Progressing   Problem: Health Behavior/Discharge Planning: Goal: Ability to manage health-related needs will improve Outcome: Not Progressing   Problem: Clinical Measurements: Goal: Ability to maintain clinical measurements within normal limits will improve Outcome: Not Progressing Goal: Will remain free from infection Outcome: Not Progressing Goal: Diagnostic test results will improve Outcome: Not Progressing Goal: Respiratory complications will improve Outcome: Not Progressing Goal: Cardiovascular complication will be avoided Outcome: Not Progressing   Problem: Activity: Goal: Risk for activity intolerance will decrease Outcome: Not Progressing   Problem: Nutrition: Goal: Adequate nutrition will be maintained Outcome: Not Progressing   Problem: Coping: Goal: Level of anxiety will decrease Outcome: Not Progressing   Problem: Elimination: Goal: Will not experience complications related to bowel motility Outcome: Not Progressing   Problem: Pain Managment: Goal: General experience of  comfort will improve and/or be controlled Outcome: Not Progressing   Problem: Skin Integrity: Goal: Risk for impaired skin integrity will decrease Outcome: Not Progressing   Problem: Education: Goal: Ability to describe self-care measures that may prevent or decrease complications (Diabetes Survival Skills Education) will improve Outcome: Not Progressing   Problem: Coping: Goal: Ability to adjust to condition or change in health will improve Outcome: Not Progressing   Problem: Fluid Volume: Goal: Ability to maintain a balanced intake and output will improve Outcome: Not Progressing   Problem: Health Behavior/Discharge Planning: Goal: Ability to identify and utilize available resources and services will improve Outcome: Not Progressing Goal: Ability to manage health-related needs will improve Outcome: Not Progressing   Problem: Metabolic: Goal: Ability to maintain appropriate glucose levels will improve Outcome: Not Progressing   Problem: Nutritional: Goal: Progress toward achieving an optimal weight will improve Outcome: Not Progressing   Problem: Skin Integrity: Goal: Risk for impaired skin integrity will decrease Outcome: Not Progressing   Problem: Activity: Goal: Ability to ambulate and perform ADLs will improve Outcome: Not Progressing   Problem: Self-Concept: Goal: Ability to maintain and perform role responsibilities to the fullest extent possible will improve Outcome: Not Progressing   Problem: Pain Management: Goal: Pain level will decrease Outcome: Not Progressing   Problem: Education: Goal: Individualized Educational Video(s) Outcome: Not Applicable   Problem: Education: Goal: Individualized Educational Video(s) Outcome: Not Applicable

## 2023-12-17 NOTE — Plan of Care (Signed)

## 2023-12-17 NOTE — Anesthesia Postprocedure Evaluation (Signed)
 Anesthesia Post Note  Patient: CORRISSA MARTELLO  Procedure(s) Performed: INSERTION, INTRAMEDULLARY ROD, FEMUR (Left: Hip)     Patient location during evaluation: PACU Anesthesia Type: General Level of consciousness: awake and alert Pain management: pain level controlled Vital Signs Assessment: post-procedure vital signs reviewed and stable Respiratory status: spontaneous breathing, nonlabored ventilation, respiratory function stable and patient connected to nasal cannula oxygen Cardiovascular status: blood pressure returned to baseline and stable Postop Assessment: no apparent nausea or vomiting Anesthetic complications: no  No notable events documented.  Last Vitals:  Vitals:   12/17/23 0541 12/17/23 1255  BP: (!) 140/75 (!) 142/70  Pulse: 83 99  Resp: 18   Temp: 36.8 C 37.1 C  SpO2: 93% 93%    Last Pain:  Vitals:   12/17/23 1750  TempSrc:   PainSc: 3    Pain Goal: Patients Stated Pain Goal: 2 (12/14/23 1228)                 Trevor Iha

## 2023-12-17 NOTE — TOC Progression Note (Signed)
 Transition of Care [facility]) - Progression Note    Patient Details  Name: Alexis Hopkins MRN: [id number] Date of Birth: 1945-11-10  [facility] of [facility]) CM/SW Contact  Otelia Santee, LCSW Phone Number: 12/17/2023, 11:30 AM  Clinical Narrative:    Pt and family agreeable to recommendation for SNF placement. Family would like SNF placement at [facility]. Referrals for placement have been sent out and currently awaiting bed offers.    Expected Discharge Plan:  (TBD\) Barriers to Discharge: Continued Medical Work up  Expected Discharge Plan and Services In-house Referral: Clinical Social Work Discharge Planning Services: NA Post Acute Care Choice: Home Health, Skilled Nursing Facility, Durable Medical Equipment Living arrangements for the past 2 months: Single Family Home                                       Social Determinants of Health (SDOH) Interventions SDOH Screenings   Food Insecurity: No Food Insecurity (12/13/2023)  Housing: Low Risk  (12/13/2023)  Transportation Needs: No Transportation Needs (12/13/2023)  Utilities: Not At Risk (12/13/2023)  Social Connections: Unknown (12/13/2023)  Tobacco Use: Low Risk  (12/14/2023)    Readmission Risk Interventions    12/14/2023   10:13 AM  Readmission Risk Prevention Plan  Post Dischage Appt Complete  Medication Screening Complete  Transportation Screening Complete

## 2023-12-18 DIAGNOSIS — D62 Acute posthemorrhagic anemia: Secondary | ICD-10-CM | POA: Diagnosis not present

## 2023-12-18 DIAGNOSIS — R338 Other retention of urine: Secondary | ICD-10-CM

## 2023-12-18 DIAGNOSIS — S72002A Fracture of unspecified part of neck of left femur, initial encounter for closed fracture: Secondary | ICD-10-CM | POA: Diagnosis not present

## 2023-12-18 LAB — GLUCOSE, CAPILLARY
Glucose-Capillary: 170 mg/dL — ABNORMAL HIGH (ref 70–99)
Glucose-Capillary: 206 mg/dL — ABNORMAL HIGH (ref 70–99)
Glucose-Capillary: 203 mg/dL — ABNORMAL HIGH (ref 70–99)
Glucose-Capillary: 283 mg/dL — ABNORMAL HIGH (ref 70–99)

## 2023-12-18 LAB — CBC WITH DIFFERENTIAL/PLATELET
Abs Immature Granulocytes: 0.06 10*3/uL (ref 0.00–0.07)
Basophils Absolute: 0 10*3/uL (ref 0.0–0.1)
Basophils Relative: 1 %
Eosinophils Absolute: 0.2 10*3/uL (ref 0.0–0.5)
Eosinophils Relative: 2 %
HCT: 28.1 % — ABNORMAL LOW (ref 36.0–46.0)
Hemoglobin: 8.8 g/dL — ABNORMAL LOW (ref 12.0–15.0)
Immature Granulocytes: 1 %
Lymphocytes Relative: 26 %
Lymphs Abs: 2.3 10*3/uL (ref 0.7–4.0)
MCH: 28.8 pg (ref 26.0–34.0)
MCHC: 31.3 g/dL (ref 30.0–36.0)
MCV: 91.8 fL (ref 80.0–100.0)
Monocytes Absolute: 0.9 10*3/uL (ref 0.1–1.0)
Monocytes Relative: 10 %
Neutro Abs: 5.3 10*3/uL (ref 1.7–7.7)
Neutrophils Relative %: 60 %
Platelets: 213 10*3/uL (ref 150–400)
RBC: 3.06 MIL/uL — ABNORMAL LOW (ref 3.87–5.11)
RDW: 15.1 % (ref 11.5–15.5)
WBC: 8.8 10*3/uL (ref 4.0–10.5)
nRBC: 0 % (ref 0.0–0.2)

## 2023-12-18 LAB — BASIC METABOLIC PANEL
Anion gap: 11 (ref 5–15)
BUN: 36 mg/dL — ABNORMAL HIGH (ref 8–23)
CO2: 24 mmol/L (ref 22–32)
Calcium: 9.2 mg/dL (ref 8.9–10.3)
Chloride: 100 mmol/L (ref 98–111)
Creatinine, Ser: 1.19 mg/dL — ABNORMAL HIGH (ref 0.44–1.00)
GFR, Estimated: 47 mL/min — ABNORMAL LOW (ref >60)
Glucose, Bld: 167 mg/dL — ABNORMAL HIGH (ref 70–99)
Potassium: 4.2 mmol/L (ref 3.5–5.1)
Sodium: 135 mmol/L (ref 135–145)

## 2023-12-18 LAB — MAGNESIUM: Magnesium: 1.9 mg/dL (ref 1.7–2.4)

## 2023-12-18 MED ORDER — ACETAMINOPHEN 325 MG PO TABS
650.0000 mg | ORAL_TABLET | Freq: Four times a day (QID) | ORAL | Status: DC
  Administered 2023-12-18 – 2023-12-26 (×23): 650 mg via ORAL
  Filled 2023-12-18 (×27): qty 2

## 2023-12-18 NOTE — Progress Notes (Signed)
 Progress Note    Alexis Hopkins   WUJ:[id number]  DOB: 09/03/1946  DOA: 12/13/2023     5 PCP: Lupita Raider, MD  Initial CC: fall at home  Hospital Course: Alexis Hopkins is a 78 year old female with PMH DM II, arthritis, BCC, HTN, HLD, TIA who presented initially to [facility] after a mechanical fall at home.  Alexis Hopkins was walking up the ramp at her house and stumbled after her foot got caught.  Alexis Hopkins was found to have left femur fracture and was transferred to [facility] for operative repair with orthopedic surgery.  Interval History:  No events overnight.  Yesterday was able to do some sit to stand exercises with PT.  Does appear a little bit more comfortable but Alexis Hopkins remains significantly weak and a 2+ assist. Goal is to try and achieve getting her more easily mobile with PT prior to discharge which may take another 2 to 3 days.  Assessment and Plan: * Closed left hip fracture (HCC) - s/p mechanical fall walking up a ramp at home.   - Comminuted proximal left femur fracture with involvement of the subtrochanteric femur and lesser trochanter per xray read -Transferred to [facility] for orthopedic surgery evaluation and operative repair - underwent IM nail fixation on 12/14/23 - on asa for DVT ppx at this time - PT/OT evals after surgery; not a CIR candidate after evaluation; now pending SNF workup - very slow to progress with PT; trying to achieve more progression with PT before discharge to SNF  ABLA (acute blood loss anemia) - anticipated blood loss with surgery; initial Hgb 10.8 g/dL, down to 6.7 g/dL on 9/56; likely accurate with EBL noted and some IVF since admission. Still possible to be some lab error, but with EBL ~250 cc, prudent to go ahead and give a unit for now and repeat after transfusion - H/H after transfusion, 8.1 g/dL - Further downtrend again on 3/16 to 7.1 g/dL; s/p 1 more unit - Hgb ~ stable, 8.7 g/dL; will need to trend still - no major bruising noted on skin exam and LLE  compartments soft; no signs of GIB at this time as well  - okay with asa for DVT ppx for now also   Diabetes mellitus (HCC) - A1c 6.7% on admission - Continue SSI and CBG monitoring  Acute urinary retention - Failed void trial on 12/16/2023 after removal; had over 500 cc on bladder scan -Foley replaced on 12/16/2023; patient will need to regain much more movement and being able to get out of bed with therapy before next retrial; bladder also needs time to rest after severe distention - very slow PT progression and suspect high chance of ongoing retention; repeat TOV in another 1-2 days  History of TIA (transient ischemic attack) - On aspirin and Plavix at home; has been resumed postop -Continue pravastatin  Essential hypertension - Continue amlodipine, HCTZ  Obese - Body mass index is 31.5 kg/m.    Old records reviewed in assessment of this patient  Antimicrobials:   DVT prophylaxis:  SCDs Start: 12/14/23 1703 SCDs Start: 12/13/23 1730   Code Status:   Code Status: Full Code  Mobility Assessment (Last 72 Hours)     Mobility Assessment     Row Name 12/18/23 0800 12/17/23 2024 12/17/23 1527 12/17/23 0705 12/16/23 2253   Does patient have an order for bedrest or is patient medically unstable No - Continue assessment No - Continue assessment -- Yes- Bedfast (Level 1) - Complete Yes- Bedfast (Level  1) - Complete   What is the highest level of mobility based on the progressive mobility assessment? Level 4 (Walks with assist in room) - Balance while marching in place and cannot step forward and back - Complete Level 4 (Walks with assist in room) - Balance while marching in place and cannot step forward and back - Complete Level 4 (Walks with assist in room) - Balance while marching in place and cannot step forward and back - Complete Level 1 (Bedfast) - Unable to balance while sitting on edge of bed Level 1 (Bedfast) - Unable to balance while sitting on edge of bed   Is the above  level different from baseline mobility prior to current illness? Yes - Recommend PT order Yes - Recommend PT order -- Yes - Recommend PT order Yes - Recommend PT order    Row Name 12/16/23 2000 12/16/23 1313 12/16/23 0945 12/15/23 2005 12/15/23 1621   Does patient have an order for bedrest or is patient medically unstable Yes- Bedfast (Level 1) - Complete -- No - Continue assessment Yes- Bedfast (Level 1) - Complete --   What is the highest level of mobility based on the progressive mobility assessment? -- Level 2 (Chairfast) - Balance while sitting on edge of bed and cannot stand Level 2 (Chairfast) - Balance while sitting on edge of bed and cannot stand -- Level 2 (Chairfast) - Balance while sitting on edge of bed and cannot stand   Is the above level different from baseline mobility prior to current illness? -- -- Yes - Recommend PT order -- --            Barriers to discharge: none Disposition Plan:  TBD HH orders placed:  Status is: Inpt  Objective: Blood pressure (!) 146/67, pulse 97, temperature 99.2 F (37.3 C), resp. rate 18, height 5\' 2"  (1.575 m), weight 79.4 kg, SpO2 93%.  Examination:  Physical Exam Constitutional:      Appearance: Normal appearance.  HENT:     Head: Normocephalic and atraumatic.     Mouth/Throat:     Mouth: Mucous membranes are moist.  Eyes:     Extraocular Movements: Extraocular movements intact.  Cardiovascular:     Rate and Rhythm: Normal rate and regular rhythm.  Pulmonary:     Effort: Pulmonary effort is normal. No respiratory distress.     Breath sounds: Normal breath sounds. No wheezing.  Abdominal:     General: Bowel sounds are normal. There is no distension.     Palpations: Abdomen is soft.     Tenderness: There is no abdominal tenderness.  Musculoskeletal:        General: Tenderness (LLE) present.     Cervical back: Normal range of motion and neck supple.     Comments: Expected left lower extremity swelling, compartments soft, no  large bruising appreciated  Skin:    General: Skin is warm and dry.  Neurological:     General: No focal deficit present.     Mental Status: Alexis Hopkins is alert.  Psychiatric:        Mood and Affect: Mood normal.      Consultants:  Orthopedic surgery  Procedures:  12/14/23: LLE IM nail fixation   Data Reviewed: Results for orders placed or performed during the hospital encounter of 12/13/23 (from the past 24 hours)  Glucose, capillary     Status: Abnormal   Collection Time: 12/17/23  4:57 PM  Result Value Ref Range   Glucose-Capillary 273 (H) 70 -  99 mg/dL   Comment 1 Notify RN    Comment 2 Document in Chart   Glucose, capillary     Status: Abnormal   Collection Time: 12/17/23  8:33 PM  Result Value Ref Range   Glucose-Capillary 184 (H) 70 - 99 mg/dL  Basic metabolic panel     Status: Abnormal   Collection Time: 12/18/23  5:18 AM  Result Value Ref Range   Sodium 135 135 - 145 mmol/L   Potassium 4.2 3.5 - 5.1 mmol/L   Chloride 100 98 - 111 mmol/L   CO2 24 22 - 32 mmol/L   Glucose, Bld 167 (H) 70 - 99 mg/dL   BUN 36 (H) 8 - 23 mg/dL   Creatinine, Ser 4.09 (H) 0.44 - 1.00 mg/dL   Calcium 9.2 8.9 - 81.1 mg/dL   GFR, Estimated 47 (L) >60 mL/min   Anion gap 11 5 - 15  CBC with Differential/Platelet     Status: Abnormal   Collection Time: 12/18/23  5:18 AM  Result Value Ref Range   WBC 8.8 4.0 - 10.5 K/uL   RBC 3.06 (L) 3.87 - 5.11 MIL/uL   Hemoglobin 8.8 (L) 12.0 - 15.0 g/dL   HCT 91.4 (L) 78.2 - 95.6 %   MCV 91.8 80.0 - 100.0 fL   MCH 28.8 26.0 - 34.0 pg   MCHC 31.3 30.0 - 36.0 g/dL   RDW 21.3 08.6 - 57.8 %   Platelets 213 150 - 400 K/uL   nRBC 0.0 0.0 - 0.2 %   Neutrophils Relative % 60 %   Neutro Abs 5.3 1.7 - 7.7 K/uL   Lymphocytes Relative 26 %   Lymphs Abs 2.3 0.7 - 4.0 K/uL   Monocytes Relative 10 %   Monocytes Absolute 0.9 0.1 - 1.0 K/uL   Eosinophils Relative 2 %   Eosinophils Absolute 0.2 0.0 - 0.5 K/uL   Basophils Relative 1 %   Basophils Absolute 0.0 0.0  - 0.1 K/uL   Immature Granulocytes 1 %   Abs Immature Granulocytes 0.06 0.00 - 0.07 K/uL  Magnesium     Status: None   Collection Time: 12/18/23  5:18 AM  Result Value Ref Range   Magnesium 1.9 1.7 - 2.4 mg/dL  Glucose, capillary     Status: Abnormal   Collection Time: 12/18/23  8:04 AM  Result Value Ref Range   Glucose-Capillary 170 (H) 70 - 99 mg/dL   Comment 1 Notify RN    Comment 2 Document in Chart   Glucose, capillary     Status: Abnormal   Collection Time: 12/18/23 12:33 PM  Result Value Ref Range   Glucose-Capillary 206 (H) 70 - 99 mg/dL   Comment 1 Notify RN    Comment 2 Document in Chart     I have reviewed pertinent nursing notes, vitals, labs, and images as necessary. I have ordered labwork to follow up on as indicated.  I have reviewed the last notes from staff over past 24 hours. I have discussed patient's care plan and test results with nursing staff, CM/SW, and other staff as appropriate.  Time spent: Greater than 50% of the 55 minute visit was spent in counseling/coordination of care for the patient as laid out in the A&P.   LOS: 5 days   Lewie Chamber, MD [facility] 12/18/2023, 1:44 PM

## 2023-12-18 NOTE — TOC Progression Note (Addendum)
 Transition of Care [facility]) - Progression Note    Patient Details  Name: Alexis Hopkins MRN: [id number] Date of Birth: 04-Mar-1946  [facility]) CM/SW Contact  Otelia Santee, LCSW Phone Number: 12/18/2023, 1:06 PM  Clinical Narrative:    Met with pt to review bed offers however, pt requested for CSW to call her daughter to review and provide choice. Spoke with pt's daughter, Victorino Dike to review bed offers. [facility] offered but, will not have bed available until next Tuesday. Per MD pt to be medically stable 3/20. She and her brother are to review facilities prior to making a selection. TOC will await bed choice.   Expected Discharge Plan:  (TBD\) Barriers to Discharge: Continued Medical Work up  Expected Discharge Plan and Services In-house Referral: Clinical Social Work Discharge Planning Services: NA Post Acute Care Choice: Home Health, Skilled Nursing Facility, Durable Medical Equipment Living arrangements for the past 2 months: Single Family Home                                       Social Determinants of Health (SDOH) Interventions SDOH Screenings   Food Insecurity: No Food Insecurity (12/13/2023)  Housing: Low Risk  (12/13/2023)  Transportation Needs: No Transportation Needs (12/13/2023)  Utilities: Not At Risk (12/13/2023)  Social Connections: Unknown (12/13/2023)  Tobacco Use: Low Risk  (12/14/2023)    Readmission Risk Interventions    12/14/2023   10:13 AM  Readmission Risk Prevention Plan  Post Dischage Appt Complete  Medication Screening Complete  Transportation Screening Complete

## 2023-12-18 NOTE — TOC CM/SW Note (Signed)
 CMS list of facilities and star ratings provided to pt to review for facility preference.       [facility] [address] [place], [place] [address] [phone] Overall rating ??  Below average  [facility] [address] [place], [place] [address] [phone] Overall rating ? Below average  [facility] [address] [place], [place] [address] [phone] Overall rating? Below average  [facility] [address] [place], [place] [address] [phone] Overall rating ? Much below average  [facility] [address] [place], [place] [address] [phone] Overall rating ???? Average  [facility] [address] [place], [place] [address] [phone] Overall rating ? Much below average   [facility] [address] [place], [place] [address] [phone] Overall rating ?? Much below average  [employer] [facility] [employer] [address] [place], [place] [address] [phone] Overall rating ??? Average  [facility] [address] [place], [place] [address] [phone] Overall rating ? Much below average  [facility] [address] [place], [place] [address] [phone] Overall rating ?? Much below average  [facility] [address] [place], [place] [address] [phone] Overall rating ??? Above average  [facility] [address] [place], [place] [address] [phone] Overall rating ????? Much above average  [facility] [address] [place], [place] [address] [phone] Overall rating ???? Above average  [facility] [address] [place], [place]  [address] [phone] Overall rating ????? Much above average  [facility] [address] [place], [place] [address] [phone] Overall rating ????  [facility] [address] [place], [place] [address] [phone] Overall rating ???? Much above average  [facility] [address] [place], [place] [address] [phone] Overall rating ????? Much above average  [facility] [address] [place], [place] [address] [phone] Overall rating ? Much below average  [facility] [address] [place], [place] [address] [phone] Overall rating ??? Average  [facility] [address] [place], [place] [address] [phone] Overall rating ????? Much above average  [facility] [address] [place], [place] [address] [phone] Overall rating ??? Average  [facility] [address] [place], [place] [address] [phone] Overall rating ?? Below average  [facility] [address] [place], [place] [address] [phone] Overall rating ??? Average  [facility] [address] [place], [place] [address] [phone] Overall rating ????? Much above average  [facility] [address] [place], [place] [address] [phone] Overall rating ? Much below average  [facility]	[place] [facility], [place] [address] [phone] Overall rating ????? Much above average  [facility] [address] [place], [place] [address] [phone] Overall rating ??? Much below average  [facility] [address] [place], [place] [address] [phone] Overall rating ??? Average  [facility]  [facility] [address] [place], [place] [address] [phone] Overall rating ??  Below average  [facility] [address] [place], [place] [address] [phone] Overall rating ? Much below average  [facility] [address] [place], [place] [address] [phone] Overall rating ??? Average  [facility] [address] [place], [place] [address] [phone] Overall rating ? Much below average  [facility] [address] [place], [place] [address] [phone] Overall rating ??? Average  [facility] [address] [place], [place] [address] [phone] Overall rating ? Much below average  [facility] [address] [place], [place] [address] [phone] Overall rating ????? Much above average  [facility] [address] [place], [place] [address] [phone] Overall rating ? Much below average  [facility] [address] [place], [place] [address] [phone] Overall rating ???? Above average  [facility] [address] [place], [place] [address] [phone] Overall rating ????? Above average  [facility] [address] [place], [place] [address] [phone] Overall rating ???? Above average  [facility] [address] [place], [place] [address] [phone] Overall rating ????? Much above average  [facility] [address] [place], [place] [address] [phone] Overall rating ? Much below average  [facility] [address] [place], [place] [address] [phone] Overall rating ????? Much above average  [facility] [address] [place], [place] [address] [phone] Overall rating ? Much below  average  [facility] [address] [place], [place] [address] [phone] Overall rating ????? Much above average  [facility] [address] [place], [place] [address] [phone] Overall rating ????? Much above average  [facility] [address] [place], [place] [address] [phone] Overall rating ??? Average  [facility] [address] [place], [place] [address] [phone] Overall rating ??? Average  [facility] [address] [place], [place] [address] [phone] Overall rating ???? Above average  [facility] [address] [place], [place] [address] [phone] Overall rating ?? Below average  [facility] [address] [place], [place] [address] [phone] Overall rating ???? Above average  [facility] [address] [place], [place] [address] [phone] Overall rating ?? Below average  [facility] [address] [place], [place] [address] [phone] Overall rating ??

## 2023-12-19 DIAGNOSIS — S72002A Fracture of unspecified part of neck of left femur, initial encounter for closed fracture: Secondary | ICD-10-CM | POA: Diagnosis not present

## 2023-12-19 LAB — BASIC METABOLIC PANEL
Anion gap: 12 (ref 5–15)
BUN: 36 mg/dL — ABNORMAL HIGH (ref 8–23)
CO2: 21 mmol/L — ABNORMAL LOW (ref 22–32)
Calcium: 9.3 mg/dL (ref 8.9–10.3)
Chloride: 102 mmol/L (ref 98–111)
Creatinine, Ser: 1 mg/dL (ref 0.44–1.00)
GFR, Estimated: 58 mL/min — ABNORMAL LOW (ref >60)
Glucose, Bld: 166 mg/dL — ABNORMAL HIGH (ref 70–99)
Potassium: 4 mmol/L (ref 3.5–5.1)
Sodium: 135 mmol/L (ref 135–145)

## 2023-12-19 LAB — CBC WITH DIFFERENTIAL/PLATELET
Abs Immature Granulocytes: 0.06 10*3/uL (ref 0.00–0.07)
Basophils Absolute: 0.1 10*3/uL (ref 0.0–0.1)
Basophils Relative: 1 %
Eosinophils Absolute: 0.2 10*3/uL (ref 0.0–0.5)
Eosinophils Relative: 3 %
HCT: 28.3 % — ABNORMAL LOW (ref 36.0–46.0)
Hemoglobin: 8.9 g/dL — ABNORMAL LOW (ref 12.0–15.0)
Immature Granulocytes: 1 %
Lymphocytes Relative: 28 %
Lymphs Abs: 2.4 10*3/uL (ref 0.7–4.0)
MCH: 29.2 pg (ref 26.0–34.0)
MCHC: 31.4 g/dL (ref 30.0–36.0)
MCV: 92.8 fL (ref 80.0–100.0)
Monocytes Absolute: 1 10*3/uL (ref 0.1–1.0)
Monocytes Relative: 12 %
Neutro Abs: 4.8 10*3/uL (ref 1.7–7.7)
Neutrophils Relative %: 55 %
Platelets: 236 10*3/uL (ref 150–400)
RBC: 3.05 MIL/uL — ABNORMAL LOW (ref 3.87–5.11)
RDW: 15 % (ref 11.5–15.5)
WBC: 8.5 10*3/uL (ref 4.0–10.5)
nRBC: 0 % (ref 0.0–0.2)

## 2023-12-19 LAB — GLUCOSE, CAPILLARY
Glucose-Capillary: 179 mg/dL — ABNORMAL HIGH (ref 70–99)
Glucose-Capillary: 304 mg/dL — ABNORMAL HIGH (ref 70–99)
Glucose-Capillary: 213 mg/dL — ABNORMAL HIGH (ref 70–99)

## 2023-12-19 LAB — MAGNESIUM: Magnesium: 1.9 mg/dL (ref 1.7–2.4)

## 2023-12-19 MED ORDER — OXYCODONE HCL 5 MG PO TABS
5.0000 mg | ORAL_TABLET | ORAL | Status: DC | PRN
  Administered 2023-12-19 – 2023-12-25 (×4): 5 mg via ORAL
  Filled 2023-12-19 (×5): qty 1

## 2023-12-19 NOTE — Progress Notes (Signed)
 Pt received in bed, pre-medicated with Robaxin and Norco. Discussed role of PT and benefits of progressing mobility. AA/AROM L LE prior to transitioning to EOB with ModA, use of rails, and assist for L LE management. Pt stood from slightly elevated bed to Youth RW with ModA. Short shuffling steps to bedside chair ~70ft with MinA. Pt struggled to wt shift onto L LE and required assist to advance L LE forward due to pain. Pt positioned to comfort in chair with all needs met. Will re-attempt gait progression in pm due to limited functional progression. Pt did put forth good effort during session.   12/19/23 1130  PT Visit Information  Assistance Needed +1  History of Present Illness Pt is 78 yo female admitted on 12/13/23 after fall on ramp at home found to have L femur fx.  She is s/p IM Nailing on 12/14/23.  Pt had low Hgb 3/15 and required 1 unit PRBC, and again  7.1 g/dL on 03/05/53 and to get another 1 unit PRBCs.  Pt with hx including but not limited to DM2, arthritis, BCC, HTN, HLD, TIA, Bil TKA, bil TSA  Subjective Data  Patient Stated Goal decrease pain; asked about getting into AIR  Precautions  Precautions Fall  Recall of Precautions/Restrictions Intact  Precaution/Restrictions Comments Acute Blood loss Anemia  Restrictions  Weight Bearing Restrictions Per Provider Order Yes  LLE Weight Bearing Per Provider Order WBAT  Pain Assessment  Pain Assessment 0-10  Pain Score 6  Pain Location L thigh/hip with activity  Pain Descriptors / Indicators Sore;Tender;Guarding;Grimacing;Sharp;Operative site guarding  Pain Intervention(s) Premedicated before session  Cognition  Arousal Alert  Behavior During Therapy WFL for tasks assessed/performed  PT - Cognitive impairments No apparent impairments  PT - Cognition Comments AxO x 3 pleasant Lady who lives home with spouse when she was walking up her RAMP carrying multiple items when she fell.  Following Commands  Following commands Intact  Cueing   Cueing Techniques Verbal cues  Communication  Communication No apparent difficulties  Bed Mobility  Overal bed mobility Needs Assistance  Bed Mobility Supine to Sit  Supine to sit Mod assist;Used rails  General bed mobility comments Slow step by step cues, assist to manage L LE  Transfers  Overall transfer level Needs assistance  Equipment used Rolling walker (2 wheels)  Transfers Sit to/from Stand  Sit to Stand Mod assist;From elevated surface  General transfer comment ModA to stand from elevated bed to RW and wt shift to gain upright standing balance  Ambulation/Gait  Ambulation/Gait assistance Min assist  Gait Distance (Feet) 3 Feet  Assistive device Rolling walker (2 wheels) (YOUTH RW)  Gait Pattern/deviations Step-to pattern;Decreased step length - right;Decreased step length - left;Decreased weight shift to left;Antalgic  General Gait Details Very limited due to pain, difficulty wt shifting onto L LE. inalility to advance L LE forward  Gait velocity decreased  Stairs  (Pt has a ramp at home)  Balance  Overall balance assessment Needs assistance  Sitting-balance support Bilateral upper extremity supported;Feet supported  Sitting balance-Leahy Scale Fair  Sitting balance - Comments Able to sit EOB for several minutes with Supervision. UE's used to reduce sitting wt through L Hip  Standing balance support Bilateral upper extremity supported;During functional activity;Reliant on assistive device for balance  Standing balance-Leahy Scale Poor  Standing balance comment Pt able to stand statically at RW with Fair balance, once ambulating pt has difficulty accepting wt through L LE, no buckling, high fall risk  General Comments  General comments (skin integrity, edema, etc.) Pt educated at length regarding recent surgery, benefits of mobility, and safe technique for transfers and gait to facilitate progression and decrease pain  Exercises  Exercises General Lower Extremity  Total  Joint Exercises  Ankle Circles/Pumps AROM;Both;15 reps;Supine  General Exercises - Lower Extremity  Long [address] [place];Left;5 reps;Seated  Heel Slides AAROM;Left;5 reps;Supine  Hip ABduction/ADduction AAROM;Left;5 reps;Supine  Other Exercises  Other Exercises Pt educated on role of PT, proper gait sequencing with RW, and current PT goals  PT - End of Session  Equipment Utilized During Treatment Gait belt  Activity Tolerance Patient limited by pain  Patient left in chair;with call bell/phone within reach;with chair alarm set  Nurse Communication Mobility status   PT - Assessment/Plan  PT Visit Diagnosis Other abnormalities of gait and mobility (R26.89);Pain  Pain - Right/Left Left  Pain - part of body Hip  PT Frequency (ACUTE ONLY) Min 2X/week  Recommendations for Other Services Rehab consult  Follow Up Recommendations Skilled nursing-short term rehab (<3 hours/day)  Can patient physically be transported by private vehicle No  Patient can return home with the following A lot of help with walking and/or transfers;A lot of help with bathing/dressing/bathroom;Assist for transportation;Help with stairs or ramp for entrance  PT equipment Rolling walker (2 wheels);Wheelchair cushion (measurements PT);Wheelchair (measurements PT);BSC/3in1;Other (comment) (YOUTH RW)  AM-PAC PT "6 Clicks" Mobility Outcome Measure (Version 2)  Help needed turning from your back to your side while in a flat bed without using bedrails? 2  Help needed moving from lying on your back to sitting on the side of a flat bed without using bedrails? 2  Help needed moving to and from a bed to a chair (including a wheelchair)? 2  Help needed standing up from a chair using your arms (e.g., wheelchair or bedside chair)? 2  Help needed to walk in hospital room? 2  Help needed climbing 3-5 steps with a railing?  1  6 Click Score 11  Consider Recommendation of Discharge To: CIR/SNF/LTACH  Progressive Mobility  What is the  highest level of mobility based on the progressive mobility assessment? Level 3 (Stands with assist) - Balance while standing  and cannot march in place  Activity Ambulated with assistance in room  PT Time Calculation  PT Start Time (ACUTE ONLY) 1059  PT Stop Time (ACUTE ONLY) 1130  PT Time Calculation (min) (ACUTE ONLY) 31 min  PT General Charges  $$ ACUTE PT VISIT 1 Visit  PT Treatments  $Therapeutic Exercise 8-22 mins  $Therapeutic Activity 8-22 mins  Zadie Cleverly, PTA 12/19/2023

## 2023-12-19 NOTE — Progress Notes (Signed)
 Physical Therapy Treatment Patient Details Name: GLENDIA OLSHEFSKI MRN: [id number] DOB: 11-01-1945 Today's Date: 12/19/2023   History of Present Illness Pt is 78 yo female admitted on 12/13/23 after fall on ramp at home found to have L femur fx.  She is s/p IM Nailing on 12/14/23.  Pt had low Hgb 3/15 and required 1 unit PRBC, and again  7.1 g/dL on 12/24/38 and to get another 1 unit PRBCs.  Pt with hx including but not limited to DM2, arthritis, BCC, HTN, HLD, TIA, Bil TKA, bil TSA    PT Comments  Therapist returned in pm for additional session to progress gait training. Improved transfer from sit to stand to RW with MinA. Pt educated on proper sequence for gait training with RW. Pt required MinA to wt shift to Right and advance L foot forward (L sock inside out to reduce friction). Pt tolerated 5'x1 and 3'x1. Very limited due to pain, difficulty accepting wt through L LE, and possible fear of falling or increased pain. Pt is currently not at independent baseline function and will benefit from STR once medically cleared for d/c. Urethral Cath remains present after difficulty voiding and pt has not had a BM at this time. Will continue to progress as tolerated and coordinate pain meds with PT session as able.    If plan is discharge home, recommend the following: A lot of help with walking and/or transfers;A lot of help with bathing/dressing/bathroom;Assist for transportation;Help with stairs or ramp for entrance   Can travel by private vehicle     No  Equipment Recommendations  Rolling walker (2 wheels);Wheelchair cushion (measurements PT);Wheelchair (measurements PT);BSC/3in1;Other (comment)    Recommendations for Other Services Rehab consult     Precautions / Restrictions Precautions Precautions: Fall Recall of Precautions/Restrictions: Intact Precaution/Restrictions Comments: Acute Blood loss Anemia Restrictions Weight Bearing Restrictions Per Provider Order: Yes LLE Weight Bearing Per  Provider Order: Weight bearing as tolerated     Mobility  Bed Mobility Overal bed mobility: Needs Assistance Bed Mobility: Supine to Sit     Supine to sit: Mod assist, Used rails     General bed mobility comments: N/T up in chair pre/post session    Transfers Overall transfer level: Needs assistance Equipment used: Rolling walker (2 wheels) Transfers: Sit to/from Stand Sit to Stand: Min assist           General transfer comment: MinA to stand from recliner chair this pm    Ambulation/Gait Ambulation/Gait assistance: Min assist Gait Distance (Feet): 5 Feet Assistive device: Rolling walker (2 wheels) Gait Pattern/deviations: Step-to pattern, Decreased step length - right, Decreased step length - left, Decreased weight shift to left, Antalgic Gait velocity: decreased     General Gait Details: Very limited due to pain, difficulty wt shifting onto L LE. inalility to advance L LE forward   Stairs Stairs:  (Pt has a ramp at home)           Wheelchair Mobility     Tilt Bed    Modified Rankin (Stroke Patients Only)       Balance Overall balance assessment: Needs assistance Sitting-balance support: Bilateral upper extremity supported, Feet supported Sitting balance-Leahy Scale: Fair Sitting balance - Comments: Able to sit EOB for several minutes with Supervision. UE's used to reduce sitting wt through L Hip   Standing balance support: Bilateral upper extremity supported, During functional activity, Reliant on assistive device for balance Standing balance-Leahy Scale: Poor Standing balance comment: Pt able to stand statically at [facility]  with Fair balance, once ambulating pt has difficulty accepting wt through L LE, no buckling, high fall risk                            Communication Communication Communication: No apparent difficulties  Cognition Arousal: Alert Behavior During Therapy: WFL for tasks assessed/performed   PT - Cognitive impairments:  No apparent impairments                       PT - Cognition Comments: AxO x 3 pleasant Lady who lives home with spouse when she was walking up her RAMP carrying multiple items when she fell. Following commands: Intact      Cueing Cueing Techniques: Verbal cues  Exercises Total Joint Exercises Ankle Circles/Pumps: AROM, Both, 15 reps, Supine General Exercises - Lower Extremity Long Arc Quad: AAROM, Left, 5 reps, Seated Heel Slides: AAROM, Left, 5 reps, Supine Hip ABduction/ADduction: AAROM, Left, 5 reps, Supine Hip Flexion/Marching: AAROM, Left, 10 reps, Seated Other Exercises Other Exercises: Pt educated on role of PT, proper gait sequencing with RW, and current PT goals    General Comments General comments (skin integrity, edema, etc.): Pt educated on gait sequencing, wt shifting, and assist to advance L LE forward      Pertinent Vitals/Pain Pain Assessment Pain Assessment: 0-10 Pain Score: 6  Pain Location: L thigh/hip with activity Pain Descriptors / Indicators: Sore, Tender, Guarding, Grimacing, Sharp, Operative site guarding Pain Intervention(s): Limited activity within patient's tolerance    Home Living                          Prior Function            PT Goals (current goals can now be found in the care plan section) Acute Rehab PT Goals Patient Stated Goal: decrease pain; asked about getting into AIR    Frequency    Min 2X/week      PT Plan      Co-evaluation              AM-PAC PT "6 Clicks" Mobility   Outcome Measure  Help needed turning from your back to your side while in a flat bed without using bedrails?: A Lot Help needed moving from lying on your back to sitting on the side of a flat bed without using bedrails?: A Lot Help needed moving to and from a bed to a chair (including a wheelchair)?: A Lot Help needed standing up from a chair using your arms (e.g., wheelchair or bedside chair)?: A Lot Help needed to walk  in hospital room?: A Lot Help needed climbing 3-5 steps with a railing? : Total 6 Click Score: 11    End of Session Equipment Utilized During Treatment: Gait belt Activity Tolerance: Patient limited by pain Patient left: in chair;with call bell/phone within reach;with chair alarm set;with family/visitor present Nurse Communication: Mobility status PT Visit Diagnosis: Other abnormalities of gait and mobility (R26.89);Pain Pain - Right/Left: Left Pain - part of body: Hip     Time: 9562-1308 PT Time Calculation (min) (ACUTE ONLY): 23 min  Charges:    $Gait Training: 8-22 mins $Therapeutic Exercise: 8-22 mins PT General Charges $$ ACUTE PT VISIT: 1 Visit                    Zadie Cleverly, PTA  Jannet Askew 12/19/2023, 4:48 PM

## 2023-12-19 NOTE — Progress Notes (Signed)
 PROGRESS NOTE    Alexis Hopkins  NWG:[id number] DOB: 1946-09-01 DOA: 12/13/2023 PCP: Lupita Raider, MD     Brief Narrative:  Alexis Hopkins is a 78 year old female with PMH DM II, arthritis, BCC, HTN, HLD, TIA who presented initially to [facility] after a mechanical fall at home. She was walking up the ramp at her house and stumbled after her foot got caught. She was found to have left femur fracture and was transferred to [facility] for operative repair with orthopedic surgery.  She underwent IM nail fixation 12/14/2023.  Currently awaiting SNF placement.  New events last 24 hours / Subjective: Patient without new complaints, still remains weak and pain on left leg  Assessment & Plan:   Principal Problem:   Closed left hip fracture (HCC) Active Problems:   Diabetes mellitus (HCC)   ABLA (acute blood loss anemia)   Acute urinary retention   Obese   Essential hypertension   History of TIA (transient ischemic attack)    Closed left hip fracture -Status post mechanical fall.  Underwent IM nail fixation by Dr. Linna Caprice 3/14 -Aspirin for DVT prophylaxis -PT OT recommended SNF placement.  Placement pending  Acute blood loss anemia following surgery -Received total 2 unit pRBC transfusion -Hemoglobin currently stable  Acute urinary retention -Failed voiding trial 3/16 and Foley catheter was replaced.  Due to patient's poor mobility, patient at high risk of ongoing retention  Diabetes mellitus -Sliding scale insulin  History of TIA -Aspirin, Plavix, pravastatin  CKD Stage IIIa -Baseline creatinine around 1 -Stable  Hypertension -Norvasc, HCTZ  Obesity -BMI 32    DVT prophylaxis:  SCDs Start: 12/14/23 1703 SCDs Start: 12/13/23 1730  Code Status: Full code Family Communication: None at bedside  Disposition Plan: SNF Status is: Inpatient Remains inpatient appropriate because: SNF placement pending     Antimicrobials:  Anti-infectives (From admission,  onward)    Start     Dose/Rate Route Frequency Ordered Stop   12/15/23 0600  ceFAZolin (ANCEF) IVPB 2g/100 mL premix        2 g 200 mL/hr over 30 Minutes Intravenous On call to O.R. 12/14/23 1219 12/14/23 1330   12/14/23 1930  ceFAZolin (ANCEF) IVPB 2g/100 mL premix        2 g 200 mL/hr over 30 Minutes Intravenous Every 6 hours 12/14/23 1702 12/15/23 0333   12/14/23 1221  ceFAZolin (ANCEF) 2-4 GM/100ML-% IVPB       Note to Pharmacy: Blair Promise B: cabinet override      12/14/23 1221 12/14/23 1330        Objective: Vitals:   12/18/23 1540 12/18/23 2116 12/19/23 0553 12/19/23 0824  BP: (!) 146/67 123/66 119/70 131/69  Pulse: 86 80 84   Resp: (!) 22 17 17    Temp: 98.9 F (37.2 C) 98 F (36.7 C) (!) 97.5 F (36.4 C)   TempSrc:  Oral Oral   SpO2:  96% 98%   Weight:      Height:        Intake/Output Summary (Last 24 hours) at 12/19/2023 1156 Last data filed at 12/19/2023 5784 Gross per 24 hour  Intake 360 ml  Output 1600 ml  Net -1240 ml   Filed Weights   12/13/23 1502 12/14/23 1228  Weight: 79.4 kg 79.4 kg    Examination:  General exam: Appears calm and comfortable  Respiratory system: Respiratory effort normal. No respiratory distress. No conversational dyspnea.  Central nervous system: Alert and oriented.  Extremities: Symmetric Psychiatry: Judgement and  insight appear normal. Mood & affect appropriate.   Data Reviewed: I have personally reviewed following labs and imaging studies  CBC: Recent Labs  Lab 12/15/23 0625 12/15/23 1710 12/16/23 0523 12/16/23 1653 12/17/23 0446 12/18/23 0518 12/19/23 0341  WBC 9.7  --  8.8  --  8.9 8.8 8.5  NEUTROABS 6.0  --  5.1  --  5.3 5.3 4.8  HGB 6.7*   < > 7.1* 9.4* 8.7* 8.8* 8.9*  HCT 21.6*   < > 22.7* 29.0* 27.4* 28.1* 28.3*  MCV 93.5  --  92.3  --  91.9 91.8 92.8  PLT 171  --  145*  --  168 213 236   < > = values in this interval not displayed.   Basic Metabolic Panel: Recent Labs  Lab 12/15/23 0625  12/16/23 0523 12/17/23 0446 12/18/23 0518 12/19/23 0341  NA 134* 135 135 135 135  K 4.6 4.4 4.3 4.2 4.0  CL 103 105 103 100 102  CO2 21* 23 24 24  21*  GLUCOSE 182* 143* 153* 167* 166*  BUN 46* 37* 32* 36* 36*  CREATININE 1.76* 1.22* 1.09* 1.19* 1.00  CALCIUM 8.0* 7.9* 8.7* 9.2 9.3  MG 2.0 1.8 2.0 1.9 1.9   GFR: Estimated Creatinine Clearance: 46.7 mL/min (by C-G formula based on SCr of 1 mg/dL). Liver Function Tests: No results for input(s): "AST", "ALT", "ALKPHOS", "BILITOT", "PROT", "ALBUMIN" in the last 168 hours. No results for input(s): "LIPASE", "AMYLASE" in the last 168 hours. No results for input(s): "AMMONIA" in the last 168 hours. Coagulation Profile: No results for input(s): "INR", "PROTIME" in the last 168 hours. Cardiac Enzymes: No results for input(s): "CKTOTAL", "CKMB", "CKMBINDEX", "TROPONINI" in the last 168 hours. BNP (last 3 results) No results for input(s): "PROBNP" in the last 8760 hours. HbA1C: No results for input(s): "HGBA1C" in the last 72 hours. CBG: Recent Labs  Lab 12/18/23 1233 12/18/23 1643 12/18/23 2118 12/19/23 0730 12/19/23 1126  GLUCAP 206* 203* 283* 179* 304*   Lipid Profile: No results for input(s): "CHOL", "HDL", "LDLCALC", "TRIG", "CHOLHDL", "LDLDIRECT" in the last 72 hours. Thyroid Function Tests: No results for input(s): "TSH", "T4TOTAL", "FREET4", "T3FREE", "THYROIDAB" in the last 72 hours. Anemia Panel: No results for input(s): "VITAMINB12", "FOLATE", "FERRITIN", "TIBC", "IRON", "RETICCTPCT" in the last 72 hours. Sepsis Labs: No results for input(s): "PROCALCITON", "LATICACIDVEN" in the last 168 hours.  Recent Results (from the past 240 hours)  Surgical PCR screen     Status: None   Collection Time: 12/13/23  9:59 PM   Specimen: Nasal Mucosa; Nasal Swab  Result Value Ref Range Status   MRSA, PCR NEGATIVE NEGATIVE Final   Staphylococcus aureus NEGATIVE NEGATIVE Final    Comment: (NOTE) The Xpert SA Assay (FDA approved  for NASAL specimens in patients 63 years of age and older), is one component of a comprehensive surveillance program. It is not intended to diagnose infection nor to guide or monitor treatment. Performed at [facility], [address] [place], [place] [address]       Radiology Studies: No results found.    Scheduled Meds:  sodium chloride   Intravenous Once   sodium chloride   Intravenous Once   acetaminophen  650 mg Oral QID   amLODipine  10 mg Oral q morning   aspirin EC  325 mg Oral Daily   B-complex with vitamin C  1 tablet Oral Daily   calcium-vitamin D  1 tablet Oral Q breakfast   Chlorhexidine Gluconate Cloth  6 each Topical Daily   clopidogrel  75 mg Oral Q breakfast   colesevelam  1,250 mg Oral BID WC   docusate sodium  100 mg Oral BID   feeding supplement (GLUCERNA SHAKE)  237 mL Oral BID BM   hydrochlorothiazide  12.5 mg Oral q morning   insulin aspart  0-15 Units Subcutaneous TID WC   insulin aspart  0-5 Units Subcutaneous QHS   multivitamin with minerals  1 tablet Oral q morning   pravastatin  40 mg Oral QPC supper   Continuous Infusions:   LOS: 6 days   Time spent: 25 minutes   Noralee Stain, DO [facility] 12/19/2023, 11:56 AM   Available via Epic secure chat 7am-7pm After these hours, please refer to coverage provider listed on [email or web address]

## 2023-12-20 DIAGNOSIS — S72002A Fracture of unspecified part of neck of left femur, initial encounter for closed fracture: Secondary | ICD-10-CM | POA: Diagnosis not present

## 2023-12-20 LAB — GLUCOSE, CAPILLARY
Glucose-Capillary: 178 mg/dL — ABNORMAL HIGH (ref 70–99)
Glucose-Capillary: 222 mg/dL — ABNORMAL HIGH (ref 70–99)
Glucose-Capillary: 252 mg/dL — ABNORMAL HIGH (ref 70–99)
Glucose-Capillary: 256 mg/dL — ABNORMAL HIGH (ref 70–99)
Glucose-Capillary: 260 mg/dL — ABNORMAL HIGH (ref 70–99)

## 2023-12-20 MED ORDER — INSULIN GLARGINE 100 UNIT/ML ~~LOC~~ SOLN
10.0000 [IU] | Freq: Every day | SUBCUTANEOUS | Status: DC
  Administered 2023-12-20 – 2023-12-26 (×7): 10 [IU] via SUBCUTANEOUS
  Filled 2023-12-20 (×7): qty 0.1

## 2023-12-20 MED ORDER — POLYETHYLENE GLYCOL 3350 17 G PO PACK
17.0000 g | PACK | Freq: Every day | ORAL | Status: DC
  Administered 2023-12-23 – 2023-12-26 (×4): 17 g via ORAL
  Filled 2023-12-20 (×6): qty 1

## 2023-12-20 NOTE — Progress Notes (Signed)
 Patient had a large bowel movement while on the bedside commode last night. There were streaks of bright red blood present in the bowel movement. The patient said she was pushing and straining a lot. Also, the patient told the nurse tech that she had used a plastic spoon to help "dig out" the bowel movement. I checked the patient after she had returned to bed and saw no signs of bleeding. Messaged Dr. Alvino Chapel.

## 2023-12-20 NOTE — Progress Notes (Signed)
 PROGRESS NOTE    Alexis Hopkins  NWG:[id number] DOB: Apr 11, 1946 DOA: 12/13/2023 PCP: Lupita Raider, MD     Brief Narrative:  Alexis Hopkins is a 78 year old female with PMH DM II, arthritis, BCC, HTN, HLD, TIA who presented initially to [facility] after a mechanical fall at home. She was walking up the ramp at her house and stumbled after her foot got caught. She was found to have left femur fracture and was transferred to [facility] for operative repair with orthopedic surgery.  She underwent IM nail fixation 12/14/2023.  Currently awaiting SNF placement.  New events last 24 hours / Subjective: Having some pain on the medial aspect of her left lower extremity today.  Nursing reported bright red blood streaks with her bowel movement, was straining quite a bit.  Appears that yesterday afternoon physical therapy session went better but still needing assistance.   Assessment & Plan:   Principal Problem:   Closed left hip fracture (HCC) Active Problems:   Diabetes mellitus (HCC)   ABLA (acute blood loss anemia)   Acute urinary retention   Obese   Essential hypertension   History of TIA (transient ischemic attack)    Closed left hip fracture -Status post mechanical fall.  Underwent IM nail fixation by Dr. Linna Caprice 3/14 -Aspirin for DVT prophylaxis -PT OT recommended SNF placement.  Placement pending  Acute blood loss anemia following surgery -Received total 2 unit pRBC transfusion -Hemoglobin currently stable  Acute urinary retention -Failed voiding trial 3/16 and Foley catheter was replaced.  Due to patient's poor mobility, patient at high risk of ongoing retention  Diabetes mellitus -Sliding scale insulin  History of TIA -Aspirin, Plavix, pravastatin  CKD Stage IIIa -Baseline creatinine around 1 -Stable  Hypertension -Norvasc, HCTZ  Obesity -BMI 32    DVT prophylaxis:  SCDs Start: 12/14/23 1703 SCDs Start: 12/13/23 1730  Code Status: Full code Family  Communication: None at bedside  Disposition Plan: SNF Status is: Inpatient Remains inpatient appropriate because: SNF placement pending     Antimicrobials:  Anti-infectives (From admission, onward)    Start     Dose/Rate Route Frequency Ordered Stop   12/15/23 0600  ceFAZolin (ANCEF) IVPB 2g/100 mL premix        2 g 200 mL/hr over 30 Minutes Intravenous On call to O.R. 12/14/23 1219 12/14/23 1330   12/14/23 1930  ceFAZolin (ANCEF) IVPB 2g/100 mL premix        2 g 200 mL/hr over 30 Minutes Intravenous Every 6 hours 12/14/23 1702 12/15/23 0333   12/14/23 1221  ceFAZolin (ANCEF) 2-4 GM/100ML-% IVPB       Note to Pharmacy: Blair Promise B: cabinet override      12/14/23 1221 12/14/23 1330        Objective: Vitals:   12/19/23 0824 12/19/23 1307 12/19/23 2246 12/20/23 0650  BP: 131/69 136/71 (!) 143/67 132/63  Pulse:  (!) 101 93 91  Resp:  17 17 17   Temp:  97.8 F (36.6 C) 98.4 F (36.9 C) 97.7 F (36.5 C)  TempSrc:   Oral   SpO2:  95% 93% 97%  Weight:      Height:        Intake/Output Summary (Last 24 hours) at 12/20/2023 1221 Last data filed at 12/20/2023 0948 Gross per 24 hour  Intake 720 ml  Output 2050 ml  Net -1330 ml   Filed Weights   12/13/23 1502 12/14/23 1228  Weight: 79.4 kg 79.4 kg    Examination:  General exam: Appears calm and comfortable  Respiratory system: Respiratory effort normal. No respiratory distress. No conversational dyspnea.  Central nervous system: Alert and oriented.  Extremities: Symmetric Psychiatry: Judgement and insight appear normal. Mood & affect appropriate.   Data Reviewed: I have personally reviewed following labs and imaging studies  CBC: Recent Labs  Lab 12/15/23 0625 12/15/23 1710 12/16/23 0523 12/16/23 1653 12/17/23 0446 12/18/23 0518 12/19/23 0341  WBC 9.7  --  8.8  --  8.9 8.8 8.5  NEUTROABS 6.0  --  5.1  --  5.3 5.3 4.8  HGB 6.7*   < > 7.1* 9.4* 8.7* 8.8* 8.9*  HCT 21.6*   < > 22.7* 29.0* 27.4* 28.1*  28.3*  MCV 93.5  --  92.3  --  91.9 91.8 92.8  PLT 171  --  145*  --  168 213 236   < > = values in this interval not displayed.   Basic Metabolic Panel: Recent Labs  Lab 12/15/23 0625 12/16/23 0523 12/17/23 0446 12/18/23 0518 12/19/23 0341  NA 134* 135 135 135 135  K 4.6 4.4 4.3 4.2 4.0  CL 103 105 103 100 102  CO2 21* 23 24 24  21*  GLUCOSE 182* 143* 153* 167* 166*  BUN 46* 37* 32* 36* 36*  CREATININE 1.76* 1.22* 1.09* 1.19* 1.00  CALCIUM 8.0* 7.9* 8.7* 9.2 9.3  MG 2.0 1.8 2.0 1.9 1.9   GFR: Estimated Creatinine Clearance: 46.7 mL/min (by C-G formula based on SCr of 1 mg/dL). Liver Function Tests: No results for input(s): "AST", "ALT", "ALKPHOS", "BILITOT", "PROT", "ALBUMIN" in the last 168 hours. No results for input(s): "LIPASE", "AMYLASE" in the last 168 hours. No results for input(s): "AMMONIA" in the last 168 hours. Coagulation Profile: No results for input(s): "INR", "PROTIME" in the last 168 hours. Cardiac Enzymes: No results for input(s): "CKTOTAL", "CKMB", "CKMBINDEX", "TROPONINI" in the last 168 hours. BNP (last 3 results) No results for input(s): "PROBNP" in the last 8760 hours. HbA1C: No results for input(s): "HGBA1C" in the last 72 hours. CBG: Recent Labs  Lab 12/19/23 0730 12/19/23 1126 12/19/23 1618 12/20/23 0052 12/20/23 0750  GLUCAP 179* 304* 213* 260* 178*   Lipid Profile: No results for input(s): "CHOL", "HDL", "LDLCALC", "TRIG", "CHOLHDL", "LDLDIRECT" in the last 72 hours. Thyroid Function Tests: No results for input(s): "TSH", "T4TOTAL", "FREET4", "T3FREE", "THYROIDAB" in the last 72 hours. Anemia Panel: No results for input(s): "VITAMINB12", "FOLATE", "FERRITIN", "TIBC", "IRON", "RETICCTPCT" in the last 72 hours. Sepsis Labs: No results for input(s): "PROCALCITON", "LATICACIDVEN" in the last 168 hours.  Recent Results (from the past 240 hours)  Surgical PCR screen     Status: None   Collection Time: 12/13/23  9:59 PM   Specimen: Nasal  Mucosa; Nasal Swab  Result Value Ref Range Status   MRSA, PCR NEGATIVE NEGATIVE Final   Staphylococcus aureus NEGATIVE NEGATIVE Final    Comment: (NOTE) The Xpert SA Assay (FDA approved for NASAL specimens in patients 3 years of age and older), is one component of a comprehensive surveillance program. It is not intended to diagnose infection nor to guide or monitor treatment. Performed at [facility], [address] [place], [place] [address]       Radiology Studies: No results found.    Scheduled Meds:  acetaminophen  650 mg Oral QID   amLODipine  10 mg Oral q morning   aspirin EC  325 mg Oral Daily   B-complex with vitamin C  1 tablet Oral Daily  calcium-vitamin D  1 tablet Oral Q breakfast   Chlorhexidine Gluconate Cloth  6 each Topical Daily   clopidogrel  75 mg Oral Q breakfast   colesevelam  1,250 mg Oral BID WC   docusate sodium  100 mg Oral BID   feeding supplement (GLUCERNA SHAKE)  237 mL Oral BID BM   hydrochlorothiazide  12.5 mg Oral q morning   insulin aspart  0-15 Units Subcutaneous TID WC   insulin aspart  0-5 Units Subcutaneous QHS   insulin glargine  10 Units Subcutaneous Daily   multivitamin with minerals  1 tablet Oral q morning   polyethylene glycol  17 g Oral Daily   pravastatin  40 mg Oral QPC supper   Continuous Infusions:   LOS: 7 days   Time spent: 25 minutes   Noralee Stain, DO [facility] 12/20/2023, 12:21 PM   Available via Epic secure chat 7am-7pm After these hours, please refer to coverage provider listed on [email or web address]

## 2023-12-20 NOTE — TOC Progression Note (Addendum)
 Transition of Care [facility]) - Progression Note    Patient Details  Name: Alexis Hopkins MRN: [id number] Date of Birth: [birth year]  [facility] CM/SW Contact  Adrian Prows, RN Phone Number: 12/20/2023, 2:45 PM  Clinical Narrative:    LVM for pt's dtr Janne Napoleon [phone]; awaiting choice.  -1450- notified by Dr Alvino Chapel that she spoke with daughter, and they would like for pt to go to [facility] on Monday when bed available; also per Dr Alvino Chapel pt will have foley d/c'd over weekend, and anticipate d/c on Monday  -1525- spoke w/ pt, dtr Victorino Dike, and family in room; they have selected [facility]; notified [place] at facility, and via SNF hub; she says bed will not be available until Monday; will start ins auth tomorrow.  Expected Discharge Plan:  (TBD\) Barriers to Discharge: Continued Medical Work up  Expected Discharge Plan and Services In-house Referral: Clinical Social Work Discharge Planning Services: NA Post Acute Care Choice: Home Health, Skilled Nursing Facility, Durable Medical Equipment Living arrangements for the past 2 months: Single Family Home                                       Social Determinants of Health (SDOH) Interventions SDOH Screenings   Food Insecurity: No Food Insecurity (12/13/2023)  Housing: Low Risk  (12/13/2023)  Transportation Needs: No Transportation Needs (12/13/2023)  Utilities: Not At Risk (12/13/2023)  Social Connections: Unknown (12/13/2023)  Tobacco Use: Low Risk  (12/14/2023)    Readmission Risk Interventions    12/14/2023   10:13 AM  Readmission Risk Prevention Plan  Post Dischage Appt Complete  Medication Screening Complete  Transportation Screening Complete

## 2023-12-21 DIAGNOSIS — S72002A Fracture of unspecified part of neck of left femur, initial encounter for closed fracture: Secondary | ICD-10-CM | POA: Diagnosis not present

## 2023-12-21 LAB — CBC
HCT: 25.4 % — ABNORMAL LOW (ref 36.0–46.0)
Hemoglobin: 8 g/dL — ABNORMAL LOW (ref 12.0–15.0)
MCH: 29.1 pg (ref 26.0–34.0)
MCHC: 31.5 g/dL (ref 30.0–36.0)
MCV: 92.4 fL (ref 80.0–100.0)
Platelets: 320 10*3/uL (ref 150–400)
RBC: 2.75 MIL/uL — ABNORMAL LOW (ref 3.87–5.11)
RDW: 15.2 % (ref 11.5–15.5)
WBC: 10.4 10*3/uL (ref 4.0–10.5)
nRBC: 0 % (ref 0.0–0.2)

## 2023-12-21 LAB — GLUCOSE, CAPILLARY
Glucose-Capillary: 220 mg/dL — ABNORMAL HIGH (ref 70–99)
Glucose-Capillary: 316 mg/dL — ABNORMAL HIGH (ref 70–99)
Glucose-Capillary: 296 mg/dL — ABNORMAL HIGH (ref 70–99)
Glucose-Capillary: 243 mg/dL — ABNORMAL HIGH (ref 70–99)
Glucose-Capillary: 190 mg/dL — ABNORMAL HIGH (ref 70–99)

## 2023-12-21 MED ORDER — INSULIN ASPART 100 UNIT/ML IJ SOLN
3.0000 [IU] | Freq: Three times a day (TID) | INTRAMUSCULAR | Status: DC
  Administered 2023-12-21 – 2023-12-26 (×13): 3 [IU] via SUBCUTANEOUS

## 2023-12-21 NOTE — Progress Notes (Signed)
 PROGRESS NOTE    Alexis Hopkins  MVH:[id number] DOB: August 25, 1946 DOA: 12/13/2023 PCP: Lupita Raider, MD     Brief Narrative:  Alexis Hopkins is a 78 year old female with PMH DM II, arthritis, BCC, HTN, HLD, TIA who presented initially to [facility] after a mechanical fall at home. She was walking up the ramp at her house and stumbled after her foot got caught. She was found to have left femur fracture and was transferred to [facility] for operative repair with orthopedic surgery.  She underwent IM nail fixation 12/14/2023.  Currently awaiting SNF placement.  New events last 24 hours / Subjective: Patient voices no new concerns.  Continue PT efforts.  SNF placement planned for 3/24.  Assessment & Plan:   Principal Problem:   Closed left hip fracture (HCC) Active Problems:   Diabetes mellitus (HCC)   ABLA (acute blood loss anemia)   Acute urinary retention   Obese   Essential hypertension   History of TIA (transient ischemic attack)    Closed left hip fracture -Status post mechanical fall.  Underwent IM nail fixation by Dr. Linna Caprice 3/14 -Aspirin for DVT prophylaxis -PT OT recommended SNF placement.  Placement pending  Acute blood loss anemia following surgery -Received total 2 unit pRBC transfusion -Hemoglobin currently stable ~8 -Continue to monitor  Acute urinary retention -Failed voiding trial 3/16 and Foley catheter was replaced.  Due to patient's poor mobility, patient at high risk of ongoing retention  Diabetes mellitus -Lantus, NovoLog, sliding scale insulin.  Dose adjusted today  History of TIA -Aspirin, Plavix, pravastatin  CKD Stage IIIa -Baseline creatinine around 1 -Stable  Hypertension -Norvasc, HCTZ  Obesity -BMI 32    DVT prophylaxis:  SCDs Start: 12/14/23 1703 SCDs Start: 12/13/23 1730  Code Status: Full code Family Communication: None at bedside, spoke with daughter yesterday over the phone Disposition Plan: SNF Status is:  Inpatient Remains inpatient appropriate because: SNF placement pending     Antimicrobials:  Anti-infectives (From admission, onward)    Start     Dose/Rate Route Frequency Ordered Stop   12/15/23 0600  ceFAZolin (ANCEF) IVPB 2g/100 mL premix        2 g 200 mL/hr over 30 Minutes Intravenous On call to O.R. 12/14/23 1219 12/14/23 1330   12/14/23 1930  ceFAZolin (ANCEF) IVPB 2g/100 mL premix        2 g 200 mL/hr over 30 Minutes Intravenous Every 6 hours 12/14/23 1702 12/15/23 0333   12/14/23 1221  ceFAZolin (ANCEF) 2-4 GM/100ML-% IVPB       Note to Pharmacy: Alexis Hopkins B: cabinet override      12/14/23 1221 12/14/23 1330        Objective: Vitals:   12/20/23 0650 12/20/23 1352 12/20/23 2149 12/21/23 0535  BP: 132/63 (!) 141/64 136/69 135/65  Pulse: 91 91 96 88  Resp: 17 16 17 16   Temp: 97.7 F (36.5 C) 97.9 F (36.6 C) 98.1 F (36.7 C) 97.7 F (36.5 C)  TempSrc:   Oral Oral  SpO2: 97% 99% 99% 100%  Weight:      Height:        Intake/Output Summary (Last 24 hours) at 12/21/2023 1047 Last data filed at 12/21/2023 1013 Gross per 24 hour  Intake 590 ml  Output 1500 ml  Net -910 ml   Filed Weights   12/13/23 1502 12/14/23 1228  Weight: 79.4 kg 79.4 kg    Examination:  General exam: Appears calm and comfortable  Respiratory system: Respiratory effort  normal. No respiratory distress. No conversational dyspnea.  Central nervous system: Alert and oriented.  Extremities: Symmetric Psychiatry: Judgement and insight appear normal. Mood & affect appropriate.   Data Reviewed: I have personally reviewed following labs and imaging studies  CBC: Recent Labs  Lab 12/15/23 0625 12/15/23 1710 12/16/23 0523 12/16/23 1653 12/17/23 0446 12/18/23 0518 12/19/23 0341 12/21/23 0320  WBC 9.7  --  8.8  --  8.9 8.8 8.5 10.4  NEUTROABS 6.0  --  5.1  --  5.3 5.3 4.8  --   HGB 6.7*   < > 7.1* 9.4* 8.7* 8.8* 8.9* 8.0*  HCT 21.6*   < > 22.7* 29.0* 27.4* 28.1* 28.3* 25.4*   MCV 93.5  --  92.3  --  91.9 91.8 92.8 92.4  PLT 171  --  145*  --  168 213 236 320   < > = values in this interval not displayed.   Basic Metabolic Panel: Recent Labs  Lab 12/15/23 0625 12/16/23 0523 12/17/23 0446 12/18/23 0518 12/19/23 0341  NA 134* 135 135 135 135  K 4.6 4.4 4.3 4.2 4.0  CL 103 105 103 100 102  CO2 21* 23 24 24  21*  GLUCOSE 182* 143* 153* 167* 166*  BUN 46* 37* 32* 36* 36*  CREATININE 1.76* 1.22* 1.09* 1.19* 1.00  CALCIUM 8.0* 7.9* 8.7* 9.2 9.3  MG 2.0 1.8 2.0 1.9 1.9   GFR: Estimated Creatinine Clearance: 46.7 mL/min (by C-G formula based on SCr of 1 mg/dL). Liver Function Tests: No results for input(s): "AST", "ALT", "ALKPHOS", "BILITOT", "PROT", "ALBUMIN" in the last 168 hours. No results for input(s): "LIPASE", "AMYLASE" in the last 168 hours. No results for input(s): "AMMONIA" in the last 168 hours. Coagulation Profile: No results for input(s): "INR", "PROTIME" in the last 168 hours. Cardiac Enzymes: No results for input(s): "CKTOTAL", "CKMB", "CKMBINDEX", "TROPONINI" in the last 168 hours. BNP (last 3 results) No results for input(s): "PROBNP" in the last 8760 hours. HbA1C: No results for input(s): "HGBA1C" in the last 72 hours. CBG: Recent Labs  Lab 12/20/23 0750 12/20/23 1151 12/20/23 1731 12/20/23 2152 12/21/23 0718  GLUCAP 178* 256* 222* 252* 220*   Lipid Profile: No results for input(s): "CHOL", "HDL", "LDLCALC", "TRIG", "CHOLHDL", "LDLDIRECT" in the last 72 hours. Thyroid Function Tests: No results for input(s): "TSH", "T4TOTAL", "FREET4", "T3FREE", "THYROIDAB" in the last 72 hours. Anemia Panel: No results for input(s): "VITAMINB12", "FOLATE", "FERRITIN", "TIBC", "IRON", "RETICCTPCT" in the last 72 hours. Sepsis Labs: No results for input(s): "PROCALCITON", "LATICACIDVEN" in the last 168 hours.  Recent Results (from the past 240 hours)  Surgical PCR screen     Status: None   Collection Time: 12/13/23  9:59 PM   Specimen:  Nasal Mucosa; Nasal Swab  Result Value Ref Range Status   MRSA, PCR NEGATIVE NEGATIVE Final   Staphylococcus aureus NEGATIVE NEGATIVE Final    Comment: (NOTE) The Xpert SA Assay (FDA approved for NASAL specimens in patients 66 years of age and older), is one component of a comprehensive surveillance program. It is not intended to diagnose infection nor to guide or monitor treatment. Performed at [facility], [address] [place], [place] [address]       Radiology Studies: No results found.    Scheduled Meds:  acetaminophen  650 mg Oral QID   amLODipine  10 mg Oral q morning   aspirin EC  325 mg Oral Daily   B-complex with vitamin C  1 tablet Oral Daily  calcium-vitamin D  1 tablet Oral Q breakfast   Chlorhexidine Gluconate Cloth  6 each Topical Daily   clopidogrel  75 mg Oral Q breakfast   colesevelam  1,250 mg Oral BID WC   docusate sodium  100 mg Oral BID   feeding supplement (GLUCERNA SHAKE)  237 mL Oral BID BM   hydrochlorothiazide  12.5 mg Oral q morning   insulin aspart  0-15 Units Subcutaneous TID WC   insulin aspart  0-5 Units Subcutaneous QHS   insulin glargine  10 Units Subcutaneous Daily   multivitamin with minerals  1 tablet Oral q morning   polyethylene glycol  17 g Oral Daily   pravastatin  40 mg Oral QPC supper   Continuous Infusions:   LOS: 8 days   Time spent: 25 minutes   Alexis Stain, DO [facility] 12/21/2023, 10:47 AM   Available via Epic secure chat 7am-7pm After these hours, please refer to coverage provider listed on [email or web address]

## 2023-12-21 NOTE — TOC Progression Note (Signed)
 Transition of Care [facility]) - Progression Note    Patient Details  Name: SOLYMAR GRACE MRN: [id number] Date of Birth: 1946-01-02  Transition of Care [facility]) CM/SW Contact  Adrian Prows, RN Phone Number: 12/21/2023, 2:06 PM  Clinical Narrative:    Initiated ins auth; awaiting response   Expected Discharge Plan:  (TBD\) Barriers to Discharge: Continued Medical Work up  Expected Discharge Plan and Services In-house Referral: Clinical Social Work Discharge Planning Services: NA Post Acute Care Choice: Home Health, Skilled Nursing Facility, Durable Medical Equipment Living arrangements for the past 2 months: Single Family Home                                       Social Determinants of Health (SDOH) Interventions SDOH Screenings   Food Insecurity: No Food Insecurity (12/13/2023)  Housing: Low Risk  (12/13/2023)  Transportation Needs: No Transportation Needs (12/13/2023)  Utilities: Not At Risk (12/13/2023)  Social Connections: Unknown (12/13/2023)  Tobacco Use: Low Risk  (12/14/2023)    Readmission Risk Interventions    12/14/2023   10:13 AM  Readmission Risk Prevention Plan  Post Dischage Appt Complete  Medication Screening Complete  Transportation Screening Complete

## 2023-12-22 DIAGNOSIS — S72002A Fracture of unspecified part of neck of left femur, initial encounter for closed fracture: Secondary | ICD-10-CM | POA: Diagnosis not present

## 2023-12-22 LAB — GLUCOSE, CAPILLARY
Glucose-Capillary: 181 mg/dL — ABNORMAL HIGH (ref 70–99)
Glucose-Capillary: 253 mg/dL — ABNORMAL HIGH (ref 70–99)
Glucose-Capillary: 201 mg/dL — ABNORMAL HIGH (ref 70–99)
Glucose-Capillary: 176 mg/dL — ABNORMAL HIGH (ref 70–99)

## 2023-12-22 LAB — CBC
HCT: 24.6 % — ABNORMAL LOW (ref 36.0–46.0)
Hemoglobin: 7.8 g/dL — ABNORMAL LOW (ref 12.0–15.0)
MCH: 29.1 pg (ref 26.0–34.0)
MCHC: 31.7 g/dL (ref 30.0–36.0)
MCV: 91.8 fL (ref 80.0–100.0)
Platelets: 383 10*3/uL (ref 150–400)
RBC: 2.68 MIL/uL — ABNORMAL LOW (ref 3.87–5.11)
RDW: 15.4 % (ref 11.5–15.5)
WBC: 12.5 10*3/uL — ABNORMAL HIGH (ref 4.0–10.5)
nRBC: 0.2 % (ref 0.0–0.2)

## 2023-12-22 NOTE — Progress Notes (Signed)
 PROGRESS NOTE    Alexis Hopkins  GGY:[id number] DOB: [birth year] DOA: 12/13/2023 PCP: Lupita Raider, MD     Brief Narrative:  Alexis Hopkins is a 78 year old female with PMH DM II, arthritis, BCC, HTN, HLD, TIA who presented initially to [facility] after a mechanical fall at home. She was walking up the ramp at her house and stumbled after her foot got caught. She was found to have left femur fracture and was transferred to [facility] for operative repair with orthopedic surgery.  She underwent IM nail fixation 12/14/2023.  Currently awaiting SNF placement.  New events last 24 hours / Subjective: Sitting in recliner today.  Had a bowel movement yesterday, did not think it had any blood.  No bowel movement yet today.  Assessment & Plan:   Principal Problem:   Closed left hip fracture (HCC) Active Problems:   Diabetes mellitus (HCC)   ABLA (acute blood loss anemia)   Acute urinary retention   Obese   Essential hypertension   History of TIA (transient ischemic attack)    Closed left hip fracture -Status post mechanical fall.  Underwent IM nail fixation by Dr. Linna Caprice 3/14 -Aspirin for DVT prophylaxis -PT OT recommended SNF placement.  Placement pending, planning for Monday  Acute blood loss anemia following surgery -Received total 2 unit pRBC transfusion -Hemoglobin currently stable ~8 -Continue to monitor  Acute urinary retention -Failed voiding trial 3/16 and Foley catheter was replaced.  Due to patient's poor mobility, patient at high risk of ongoing retention  Diabetes mellitus -Lantus, NovoLog, sliding scale insulin  History of TIA -Aspirin, Plavix, pravastatin  CKD Stage IIIa -Baseline creatinine around 1 -Stable  Hypertension -Norvasc, HCTZ  Obesity -BMI 32    DVT prophylaxis:  SCDs Start: 12/14/23 1703 SCDs Start: 12/13/23 1730  Code Status: Full code Family Communication: None at bedside Disposition Plan: SNF Status is: Inpatient Remains  inpatient appropriate because: SNF placement pending, planning for Monday    Antimicrobials:  Anti-infectives (From admission, onward)    Start     Dose/Rate Route Frequency Ordered Stop   12/15/23 0600  ceFAZolin (ANCEF) IVPB 2g/100 mL premix        2 g 200 mL/hr over 30 Minutes Intravenous On call to O.R. 12/14/23 1219 12/14/23 1330   12/14/23 1930  ceFAZolin (ANCEF) IVPB 2g/100 mL premix        2 g 200 mL/hr over 30 Minutes Intravenous Every 6 hours 12/14/23 1702 12/15/23 0333   12/14/23 1221  ceFAZolin (ANCEF) 2-4 GM/100ML-% IVPB       Note to Pharmacy: Blair Promise B: cabinet override      12/14/23 1221 12/14/23 1330        Objective: Vitals:   12/21/23 1347 12/21/23 2128 12/22/23 0516 12/22/23 0939  BP: (!) 140/60 (!) 147/66 132/78 133/63  Pulse: (!) 101 91  99  Resp: 17 17 18 16   Temp: 98.3 F (36.8 C) 98.6 F (37 C) 98.4 F (36.9 C) 98.5 F (36.9 C)  TempSrc: Oral Oral Oral Oral  SpO2: 90% 96% 97% 99%  Weight:      Height:        Intake/Output Summary (Last 24 hours) at 12/22/2023 1258 Last data filed at 12/22/2023 0900 Gross per 24 hour  Intake 420 ml  Output 1630 ml  Net -1210 ml   Filed Weights   12/13/23 1502 12/14/23 1228  Weight: 79.4 kg 79.4 kg    Examination:  General exam: Appears calm and comfortable  Respiratory system: Respiratory effort normal. No respiratory distress. No conversational dyspnea.  Central nervous system: Alert and oriented.  Extremities: Symmetric Psychiatry: Judgement and insight appear normal. Mood & affect appropriate.   Data Reviewed: I have personally reviewed following labs and imaging studies  CBC: Recent Labs  Lab 12/16/23 0523 12/16/23 1653 12/17/23 0446 12/18/23 0518 12/19/23 0341 12/21/23 0320 12/22/23 0349  WBC 8.8  --  8.9 8.8 8.5 10.4 12.5*  NEUTROABS 5.1  --  5.3 5.3 4.8  --   --   HGB 7.1*   < > 8.7* 8.8* 8.9* 8.0* 7.8*  HCT 22.7*   < > 27.4* 28.1* 28.3* 25.4* 24.6*  MCV 92.3  --  91.9  91.8 92.8 92.4 91.8  PLT 145*  --  168 213 236 320 383   < > = values in this interval not displayed.   Basic Metabolic Panel: Recent Labs  Lab 12/16/23 0523 12/17/23 0446 12/18/23 0518 12/19/23 0341  NA 135 135 135 135  K 4.4 4.3 4.2 4.0  CL 105 103 100 102  CO2 23 24 24  21*  GLUCOSE 143* 153* 167* 166*  BUN 37* 32* 36* 36*  CREATININE 1.22* 1.09* 1.19* 1.00  CALCIUM 7.9* 8.7* 9.2 9.3  MG 1.8 2.0 1.9 1.9   GFR: Estimated Creatinine Clearance: 46.7 mL/min (by C-G formula based on SCr of 1 mg/dL). Liver Function Tests: No results for input(s): "AST", "ALT", "ALKPHOS", "BILITOT", "PROT", "ALBUMIN" in the last 168 hours. No results for input(s): "LIPASE", "AMYLASE" in the last 168 hours. No results for input(s): "AMMONIA" in the last 168 hours. Coagulation Profile: No results for input(s): "INR", "PROTIME" in the last 168 hours. Cardiac Enzymes: No results for input(s): "CKTOTAL", "CKMB", "CKMBINDEX", "TROPONINI" in the last 168 hours. BNP (last 3 results) No results for input(s): "PROBNP" in the last 8760 hours. HbA1C: No results for input(s): "HGBA1C" in the last 72 hours. CBG: Recent Labs  Lab 12/21/23 1238 12/21/23 1651 12/21/23 2122 12/22/23 0734 12/22/23 1127  GLUCAP 296* 243* 190* 181* 253*   Lipid Profile: No results for input(s): "CHOL", "HDL", "LDLCALC", "TRIG", "CHOLHDL", "LDLDIRECT" in the last 72 hours. Thyroid Function Tests: No results for input(s): "TSH", "T4TOTAL", "FREET4", "T3FREE", "THYROIDAB" in the last 72 hours. Anemia Panel: No results for input(s): "VITAMINB12", "FOLATE", "FERRITIN", "TIBC", "IRON", "RETICCTPCT" in the last 72 hours. Sepsis Labs: No results for input(s): "PROCALCITON", "LATICACIDVEN" in the last 168 hours.  Recent Results (from the past 240 hours)  Surgical PCR screen     Status: None   Collection Time: 12/13/23  9:59 PM   Specimen: Nasal Mucosa; Nasal Swab  Result Value Ref Range Status   MRSA, PCR NEGATIVE NEGATIVE  Final   Staphylococcus aureus NEGATIVE NEGATIVE Final    Comment: (NOTE) The Xpert SA Assay (FDA approved for NASAL specimens in patients 78 years of age and older), is one component of a comprehensive surveillance program. It is not intended to diagnose infection nor to guide or monitor treatment. Performed at [facility], [address], [place], [place] [address]       Radiology Studies: No results found.    Scheduled Meds:  acetaminophen  650 mg Oral QID   amLODipine  10 mg Oral q morning   aspirin EC  325 mg Oral Daily   B-complex with vitamin C  1 tablet Oral Daily   calcium-vitamin D  1 tablet Oral Q breakfast   Chlorhexidine Gluconate Cloth  6 each Topical Daily  clopidogrel  75 mg Oral Q breakfast   colesevelam  1,250 mg Oral BID WC   docusate sodium  100 mg Oral BID   feeding supplement (GLUCERNA SHAKE)  237 mL Oral BID BM   hydrochlorothiazide  12.5 mg Oral q morning   insulin aspart  0-15 Units Subcutaneous TID WC   insulin aspart  0-5 Units Subcutaneous QHS   insulin aspart  3 Units Subcutaneous TID WC   insulin glargine  10 Units Subcutaneous Daily   multivitamin with minerals  1 tablet Oral q morning   polyethylene glycol  17 g Oral Daily   pravastatin  40 mg Oral QPC supper   Continuous Infusions:   LOS: 9 days   Time spent: 25 minutes   Noralee Stain, DO [facility] 12/22/2023, 12:58 PM   Available via Epic secure chat 7am-7pm After these hours, please refer to coverage provider listed on [email or web address]

## 2023-12-22 NOTE — Progress Notes (Signed)
 Physical Therapy Treatment Patient Details Name: Alexis Hopkins MRN: [id number] DOB: [birth year] Today's Date: 12/22/2023   History of Present Illness Pt is 78 yo female admitted on 12/13/23 after fall on ramp at home found to have L femur fx.  She is s/p IM Nailing on 12/14/23.  Pt had low Hgb 3/15 and required 1 unit PRBC, and again  7.1 g/dL on 11/14/06 and to get another 1 unit PRBCs.  Pt with hx including but not limited to DM2, arthritis, BCC, HTN, HLD, TIA, Bil TKA, bil TSA    PT Comments  Pt performed a couple exercises sitting in recliner.  Pt continues to have difficulty with left hip flexion and has been utilized gait belt for self physical assist per her report.  Pt utilized gait belt today and able to ambulate short bouts but requires multiple seated rest breaks.  Pt encouraged to attempt no use of belt next session to start engaging hip musculature more during functional tasks (however also made her aware that this may limit her distance, since she performed more distance today then she has previously).  Pt anticipating d/c Monday.  Current d/c plan remains appropriate. Patient will benefit from continued inpatient follow up therapy, <3 hours/day     If plan is discharge home, recommend the following: A little help with walking and/or transfers;A little help with bathing/dressing/bathroom;Assistance with cooking/housework;Assist for transportation;Help with stairs or ramp for entrance   Can travel by private vehicle     Yes  Equipment Recommendations       Recommendations for Other Services       Precautions / Restrictions Precautions Precautions: Fall Recall of Precautions/Restrictions: Intact Restrictions LLE Weight Bearing Per Provider Order: Weight bearing as tolerated Other Position/Activity Restrictions: WBAT with RW per ortho notes     Mobility  Bed Mobility               General bed mobility comments: N/T up in chair pre/post session     Transfers Overall transfer level: Needs assistance Equipment used: Rolling walker (2 wheels) Transfers: Sit to/from Stand Sit to Stand: Min assist, Contact guard assist           General transfer comment: initially light assist to rise, improved to CGA with cues for UE and LE positioning for pain control    Ambulation/Gait Ambulation/Gait assistance: Min assist, Contact guard assist Gait Distance (Feet): 18 Feet (see below) Assistive device: Rolling walker (2 wheels) Gait Pattern/deviations: Step-to pattern, Decreased stance time - left, Antalgic Gait velocity: decreased     General Gait Details: limited ability to advance L LE so pt utilizes gait belt around L foot and held at L hand to provide physical assist - pt reports initial heavy reliance on this but improve a little with distance; recliner following for safety and due to multiple rest breaks; 4 seated rest breaks required; 10'x1, 14'x1, 14'x1, 16'x1and 18'x1   Stairs             Wheelchair Mobility     Tilt Bed    Modified Rankin (Stroke Patients Only)       Balance                                            Communication Communication Communication: No apparent difficulties  Cognition Arousal: Alert Behavior During Therapy: WFL for tasks assessed/performed   PT -  Cognitive impairments: No apparent impairments                         Following commands: Intact      Cueing Cueing Techniques: Verbal cues  Exercises Total Joint Exercises Ankle Circles/Pumps: AROM, Both, 15 reps, Supine Long Arc Quad: AAROM, Left, 10 reps, Seated Knee Flexion: AAROM, Left, 5 reps, Seated    General Comments        Pertinent Vitals/Pain Pain Assessment Pain Assessment: 0-10 Pain Score: 6  Pain Location: L thigh/hip with activity Pain Descriptors / Indicators: Aching, Sore, Guarding, Grimacing, Spasm Pain Intervention(s): Monitored during session, Repositioned, Ice applied     Home Living                          Prior Function            PT Goals (current goals can now be found in the care plan section) Progress towards PT goals: Progressing toward goals    Frequency    Min 2X/week      PT Plan      Co-evaluation              AM-PAC PT "6 Clicks" Mobility   Outcome Measure  Help needed turning from your back to your side while in a flat bed without using bedrails?: A Little Help needed moving from lying on your back to sitting on the side of a flat bed without using bedrails?: A Little Help needed moving to and from a bed to a chair (including a wheelchair)?: A Little Help needed standing up from a chair using your arms (e.g., wheelchair or bedside chair)?: A Little Help needed to walk in hospital room?: A Little Help needed climbing 3-5 steps with a railing? : A Lot 6 Click Score: 17    End of Session Equipment Utilized During Treatment: Gait belt Activity Tolerance: Patient tolerated treatment well Patient left: in chair;with call bell/phone within reach;with chair alarm set   PT Visit Diagnosis: Other abnormalities of gait and mobility (R26.89);Pain Pain - Right/Left: Left Pain - part of body: Hip     Time: 8469-6295 PT Time Calculation (min) (ACUTE ONLY): 23 min  Charges:    $Gait Training: 23-37 mins PT General Charges $$ ACUTE PT VISIT: 1 Visit                     Alexis Hopkins, DPT Physical Therapist Acute Rehabilitation Services Office: [phone]    Alexis Hopkins [place] 12/22/2023, 12:52 PM

## 2023-12-22 NOTE — Plan of Care (Signed)

## 2023-12-23 DIAGNOSIS — S72002A Fracture of unspecified part of neck of left femur, initial encounter for closed fracture: Secondary | ICD-10-CM | POA: Diagnosis not present

## 2023-12-23 LAB — CBC
HCT: 24 % — ABNORMAL LOW (ref 36.0–46.0)
Hemoglobin: 7.5 g/dL — ABNORMAL LOW (ref 12.0–15.0)
MCH: 29.2 pg (ref 26.0–34.0)
MCHC: 31.3 g/dL (ref 30.0–36.0)
MCV: 93.4 fL (ref 80.0–100.0)
Platelets: 419 10*3/uL — ABNORMAL HIGH (ref 150–400)
RBC: 2.57 MIL/uL — ABNORMAL LOW (ref 3.87–5.11)
RDW: 15.7 % — ABNORMAL HIGH (ref 11.5–15.5)
WBC: 11.3 10*3/uL — ABNORMAL HIGH (ref 4.0–10.5)
nRBC: 0.3 % — ABNORMAL HIGH (ref 0.0–0.2)

## 2023-12-23 LAB — IRON AND TIBC
Iron: 65 ug/dL (ref 28–170)
Saturation Ratios: 18 % (ref 10.4–31.8)
TIBC: 361 ug/dL (ref 250–450)
UIBC: 296 ug/dL

## 2023-12-23 LAB — GLUCOSE, CAPILLARY
Glucose-Capillary: 165 mg/dL — ABNORMAL HIGH (ref 70–99)
Glucose-Capillary: 227 mg/dL — ABNORMAL HIGH (ref 70–99)
Glucose-Capillary: 198 mg/dL — ABNORMAL HIGH (ref 70–99)
Glucose-Capillary: 214 mg/dL — ABNORMAL HIGH (ref 70–99)

## 2023-12-23 LAB — FERRITIN: Ferritin: 85 ng/mL (ref 11–307)

## 2023-12-23 LAB — OCCULT BLOOD X 1 CARD TO LAB, STOOL: Fecal Occult Bld: NEGATIVE

## 2023-12-23 MED ORDER — FERROUS SULFATE 325 (65 FE) MG PO TABS
325.0000 mg | ORAL_TABLET | Freq: Every day | ORAL | Status: DC
  Administered 2023-12-24 – 2023-12-26 (×3): 325 mg via ORAL
  Filled 2023-12-23 (×4): qty 1

## 2023-12-23 NOTE — Progress Notes (Signed)
 PROGRESS NOTE    MILAYAH KRELL  ZOX:[id number] DOB: 1946/08/28 DOA: 12/13/2023 PCP: Lupita Raider, MD     Brief Narrative:  NIMSI MALES is a 78 year old female with PMH DM II, arthritis, BCC, HTN, HLD, TIA who presented initially to [facility] after a mechanical fall at home. She was walking up the ramp at her house and stumbled after her foot got caught. She was found to have left femur fracture and was transferred to [facility] for operative repair with orthopedic surgery.  She underwent IM nail fixation 12/14/2023.  Currently awaiting SNF placement.  New events last 24 hours / Subjective: States that she was able to walk down the hall to the refrigerator and back.  Pain/cramping is improved today.  Still has not had a bowel movement.  Denies any lightheadedness or dizziness.  She tells me that her primary care physician has been considering stopping her Plavix as outpatient.   Assessment & Plan:   Principal Problem:   Closed left hip fracture (HCC) Active Problems:   Diabetes mellitus (HCC)   ABLA (acute blood loss anemia)   Acute urinary retention   Obese   Essential hypertension   History of TIA (transient ischemic attack)    Closed left hip fracture -Status post mechanical fall.  Underwent IM nail fixation by Dr. Linna Caprice 3/14 -Aspirin for DVT prophylaxis - holding aspirin currently.  Continue SCDs -PT OT recommended SNF placement.  Placement pending, planning for Monday as long as her hemoglobin remains stable  Acute blood loss anemia following surgery -Received total 2 unit pRBC transfusion -Hemoglobin slowly dropping.  Hemoglobin 7.5 today. -Continue to monitor, transfuse for hemoglobin <7 -Saw [facility] in the past. No current sign of GI bleeding. Check ferritin and iron/TIBC level today. She does take iron supplement PTA   Acute urinary retention -Failed voiding trial 3/16 and Foley catheter was replaced.  Due to patient's poor mobility, patient at high  risk of ongoing retention -Remove Foley catheter today for voiding trial  Diabetes mellitus -Lantus, NovoLog, sliding scale insulin  History of TIA -Aspirin, Plavix, pravastatin -Hold aspirin and Plavix  CKD Stage IIIa -Baseline creatinine around 1 -Stable  Hypertension -Norvasc, HCTZ  Obesity -BMI 32    DVT prophylaxis:  SCDs Start: 12/14/23 1703 SCDs Start: 12/13/23 1730  Code Status: Full code Family Communication: None at bedside, discussed with daughter by phone today  Disposition Plan: SNF Status is: Inpatient Remains inpatient appropriate because: SNF placement pending, planning for Monday if hemoglobin remains stable    Antimicrobials:  Anti-infectives (From admission, onward)    Start     Dose/Rate Route Frequency Ordered Stop   12/15/23 0600  ceFAZolin (ANCEF) IVPB 2g/100 mL premix        2 g 200 mL/hr over 30 Minutes Intravenous On call to O.R. 12/14/23 1219 12/14/23 1330   12/14/23 1930  ceFAZolin (ANCEF) IVPB 2g/100 mL premix        2 g 200 mL/hr over 30 Minutes Intravenous Every 6 hours 12/14/23 1702 12/15/23 0333   12/14/23 1221  ceFAZolin (ANCEF) 2-4 GM/100ML-% IVPB       Note to Pharmacy: Blair Promise B: cabinet override      12/14/23 1221 12/14/23 1330        Objective: Vitals:   12/22/23 0939 12/22/23 1324 12/22/23 2156 12/23/23 0548  BP: 133/63 (!) 157/61 134/61 137/78  Pulse: 99 95 87 76  Resp: 16 18 15 15   Temp: 98.5 F (36.9 C) 98.1 F (  36.7 C) 98.1 F (36.7 C) 97.8 F (36.6 C)  TempSrc: Oral Oral    SpO2: 99% 96% 97% 99%  Weight:      Height:        Intake/Output Summary (Last 24 hours) at 12/23/2023 1036 Last data filed at 12/23/2023 1000 Gross per 24 hour  Intake 600 ml  Output 2550 ml  Net -1950 ml   Filed Weights   12/13/23 1502 12/14/23 1228  Weight: 79.4 kg 79.4 kg    Examination:  General exam: Appears calm and comfortable  Respiratory system: Respiratory effort normal. No respiratory distress. No  conversational dyspnea.  Central nervous system: Alert and oriented.  Extremities: Left lower extremity with some edema but soft and nontender to palpation, no significant bruising to indicate significant hematoma Psychiatry: Judgement and insight appear normal. Mood & affect appropriate.   Data Reviewed: I have personally reviewed following labs and imaging studies  CBC: Recent Labs  Lab 12/17/23 0446 12/18/23 0518 12/19/23 0341 12/21/23 0320 12/22/23 0349 12/23/23 0319  WBC 8.9 8.8 8.5 10.4 12.5* 11.3*  NEUTROABS 5.3 5.3 4.8  --   --   --   HGB 8.7* 8.8* 8.9* 8.0* 7.8* 7.5*  HCT 27.4* 28.1* 28.3* 25.4* 24.6* 24.0*  MCV 91.9 91.8 92.8 92.4 91.8 93.4  PLT 168 213 236 320 383 419*   Basic Metabolic Panel: Recent Labs  Lab 12/17/23 0446 12/18/23 0518 12/19/23 0341  NA 135 135 135  K 4.3 4.2 4.0  CL 103 100 102  CO2 24 24 21*  GLUCOSE 153* 167* 166*  BUN 32* 36* 36*  CREATININE 1.09* 1.19* 1.00  CALCIUM 8.7* 9.2 9.3  MG 2.0 1.9 1.9   GFR: Estimated Creatinine Clearance: 46.7 mL/min (by C-G formula based on SCr of 1 mg/dL). Liver Function Tests: No results for input(s): "AST", "ALT", "ALKPHOS", "BILITOT", "PROT", "ALBUMIN" in the last 168 hours. No results for input(s): "LIPASE", "AMYLASE" in the last 168 hours. No results for input(s): "AMMONIA" in the last 168 hours. Coagulation Profile: No results for input(s): "INR", "PROTIME" in the last 168 hours. Cardiac Enzymes: No results for input(s): "CKTOTAL", "CKMB", "CKMBINDEX", "TROPONINI" in the last 168 hours. BNP (last 3 results) No results for input(s): "PROBNP" in the last 8760 hours. HbA1C: No results for input(s): "HGBA1C" in the last 72 hours. CBG: Recent Labs  Lab 12/22/23 0734 12/22/23 1127 12/22/23 1651 12/22/23 2200 12/23/23 0740  GLUCAP 181* 253* 201* 176* 165*   Lipid Profile: No results for input(s): "CHOL", "HDL", "LDLCALC", "TRIG", "CHOLHDL", "LDLDIRECT" in the last 72 hours. Thyroid  Function Tests: No results for input(s): "TSH", "T4TOTAL", "FREET4", "T3FREE", "THYROIDAB" in the last 72 hours. Anemia Panel: No results for input(s): "VITAMINB12", "FOLATE", "FERRITIN", "TIBC", "IRON", "RETICCTPCT" in the last 72 hours. Sepsis Labs: No results for input(s): "PROCALCITON", "LATICACIDVEN" in the last 168 hours.  Recent Results (from the past 240 hours)  Surgical PCR screen     Status: None   Collection Time: 12/13/23  9:59 PM   Specimen: Nasal Mucosa; Nasal Swab  Result Value Ref Range Status   MRSA, PCR NEGATIVE NEGATIVE Final   Staphylococcus aureus NEGATIVE NEGATIVE Final    Comment: (NOTE) The Xpert SA Assay (FDA approved for NASAL specimens in patients 42 years of age and older), is one component of a comprehensive surveillance program. It is not intended to diagnose infection nor to guide or monitor treatment. Performed at [facility], [address] [place], [place] [address]  Radiology Studies: No results found.    Scheduled Meds:  acetaminophen  650 mg Oral QID   amLODipine  10 mg Oral q morning   B-complex with vitamin C  1 tablet Oral Daily   calcium-vitamin D  1 tablet Oral Q breakfast   Chlorhexidine Gluconate Cloth  6 each Topical Daily   colesevelam  1,250 mg Oral BID WC   docusate sodium  100 mg Oral BID   feeding supplement (GLUCERNA SHAKE)  237 mL Oral BID BM   hydrochlorothiazide  12.5 mg Oral q morning   insulin aspart  0-15 Units Subcutaneous TID WC   insulin aspart  0-5 Units Subcutaneous QHS   insulin aspart  3 Units Subcutaneous TID WC   insulin glargine  10 Units Subcutaneous Daily   multivitamin with minerals  1 tablet Oral q morning   polyethylene glycol  17 g Oral Daily   pravastatin  40 mg Oral QPC supper   Continuous Infusions:   LOS: 10 days   Time spent: 30 minutes   Noralee Stain, DO [facility] 12/23/2023, 10:36 AM   Available via Epic secure chat 7am-7pm After these hours,  please refer to coverage provider listed on [email or web address]

## 2023-12-24 ENCOUNTER — Inpatient Hospital Stay (HOSPITAL_COMMUNITY)

## 2023-12-24 DIAGNOSIS — S72002A Fracture of unspecified part of neck of left femur, initial encounter for closed fracture: Secondary | ICD-10-CM | POA: Diagnosis not present

## 2023-12-24 LAB — CBC
HCT: 25.7 % — ABNORMAL LOW (ref 36.0–46.0)
Hemoglobin: 8 g/dL — ABNORMAL LOW (ref 12.0–15.0)
MCH: 29.3 pg (ref 26.0–34.0)
MCHC: 31.1 g/dL (ref 30.0–36.0)
MCV: 94.1 fL (ref 80.0–100.0)
Platelets: 517 10*3/uL — ABNORMAL HIGH (ref 150–400)
RBC: 2.73 MIL/uL — ABNORMAL LOW (ref 3.87–5.11)
RDW: 15.9 % — ABNORMAL HIGH (ref 11.5–15.5)
WBC: 12.2 10*3/uL — ABNORMAL HIGH (ref 4.0–10.5)
nRBC: 0.5 % — ABNORMAL HIGH (ref 0.0–0.2)

## 2023-12-24 LAB — GLUCOSE, CAPILLARY
Glucose-Capillary: 187 mg/dL — ABNORMAL HIGH (ref 70–99)
Glucose-Capillary: 269 mg/dL — ABNORMAL HIGH (ref 70–99)
Glucose-Capillary: 223 mg/dL — ABNORMAL HIGH (ref 70–99)
Glucose-Capillary: 237 mg/dL — ABNORMAL HIGH (ref 70–99)

## 2023-12-24 MED ORDER — ASPIRIN 81 MG PO TBEC
81.0000 mg | DELAYED_RELEASE_TABLET | Freq: Every day | ORAL | Status: DC
  Administered 2023-12-24 – 2023-12-26 (×3): 81 mg via ORAL
  Filled 2023-12-24 (×3): qty 1

## 2023-12-24 MED ORDER — LISINOPRIL 20 MG PO TABS
40.0000 mg | ORAL_TABLET | Freq: Every morning | ORAL | Status: DC
  Administered 2023-12-24 – 2023-12-26 (×3): 40 mg via ORAL
  Filled 2023-12-24 (×3): qty 2

## 2023-12-24 NOTE — Progress Notes (Addendum)
 PROGRESS NOTE    Alexis Hopkins  ZOX:[id number] DOB: 1946-01-07 DOA: 12/13/2023 PCP: Lupita Raider, MD     Brief Narrative:  Alexis Hopkins is a 78 year old female with PMH DM II, arthritis, BCC, HTN, HLD, TIA who presented initially to [facility] after a mechanical fall at home. She was walking up the ramp at her house and stumbled after her foot got caught. She was found to have left femur fracture and was transferred to [facility] for operative repair with orthopedic surgery.  She underwent IM nail fixation 12/14/2023.  Currently awaiting SNF placement.  New events last 24 hours / Subjective: She states that this morning, she was getting repositioned/moved and her left leg was twisted a certain way and she heard/felt a pop sensation, followed by pain and swelling. Otherwise, had BM and has been urinating well.   Assessment & Plan:   Principal Problem:   Closed left hip fracture (HCC) Active Problems:   Diabetes mellitus (HCC)   ABLA (acute blood loss anemia)   Acute urinary retention   Obese   Essential hypertension   History of TIA (transient ischemic attack)    Closed left hip fracture -Status post mechanical fall.  Underwent IM nail fixation by Dr. Linna Caprice 3/14 -Aspirin for DVT prophylaxis.  Continue SCDs -PT OT recommended SNF placement.  Placement pending, planning for tmrw as long as her knee xray results stable and she is progressing with PT. Awaiting insurance auth  -Knee pain today - xray results pending   Acute blood loss anemia following surgery -Received total 2 unit pRBC transfusion -Continue to monitor, transfuse for hemoglobin <7 -Saw [facility] in the past. No current sign of GI bleeding. Ferritin and iron/TIBC level within normal. FOBT negative.  -Iron supplement ordered  -Resume baby aspirin for TIA and DVT ppx today  -Trend CBC   Acute urinary retention -Failed voiding trial 3/16 and Foley catheter was replaced -Removed foley 3/23. Now voiding  well   Diabetes mellitus -Lantus, NovoLog, sliding scale insulin  History of TIA -Aspirin, Plavix, pravastatin PTA -Discussed with patient. She had TIA decades ago and has been taking DAPT for many years. We discussed stopping plavix at this point and continuing baby aspirin only   CKD Stage IIIa -Baseline creatinine around 1 -Stable  Hypertension -Norvasc, hydrochlorothiazide, lisinopril   Obesity -BMI 32    DVT prophylaxis:  SCDs Start: 12/14/23 1703 SCDs Start: 12/13/23 1730  Code Status: Full code Family Communication: None at bedside, discussed with daughter by phone today  Disposition Plan: SNF Status is: Inpatient Remains inpatient appropriate because: SNF placement pending, auth pending, await knee and hip xray today. PT again today     Antimicrobials:  Anti-infectives (From admission, onward)    Start     Dose/Rate Route Frequency Ordered Stop   12/15/23 0600  ceFAZolin (ANCEF) IVPB 2g/100 mL premix        2 g 200 mL/hr over 30 Minutes Intravenous On call to O.R. 12/14/23 1219 12/14/23 1330   12/14/23 1930  ceFAZolin (ANCEF) IVPB 2g/100 mL premix        2 g 200 mL/hr over 30 Minutes Intravenous Every 6 hours 12/14/23 1702 12/15/23 0333   12/14/23 1221  ceFAZolin (ANCEF) 2-4 GM/100ML-% IVPB       Note to Pharmacy: [facility]: cabinet override      12/14/23 1221 12/14/23 1330        Objective: Vitals:   12/23/23 1101 12/23/23 2126 12/24/23 0600 12/24/23 1306  BP: (!) 149/73 (!) 146/62 (!) 150/69 (!) 142/57  Pulse: 92 94 95 92  Resp: 18 18 19 18   Temp: 98.3 F (36.8 C) 98.2 F (36.8 C) 97.8 F (36.6 C) 98.5 F (36.9 C)  TempSrc:  Oral Oral   SpO2: 100% 94% 96% 98%  Weight:      Height:        Intake/Output Summary (Last 24 hours) at 12/24/2023 1307 Last data filed at 12/24/2023 0800 Gross per 24 hour  Intake 820 ml  Output 400 ml  Net 420 ml   Filed Weights   12/13/23 1502 12/14/23 1228  Weight: 79.4 kg 79.4 kg     Examination:  General exam: Appears calm and comfortable  Respiratory system: Respiratory effort normal. No respiratory distress. No conversational dyspnea.  Central nervous system: Alert and oriented.  Extremities: Left knee with mild joint effusion  Psychiatry: Judgement and insight appear normal. Mood & affect appropriate.   Data Reviewed: I have personally reviewed following labs and imaging studies  CBC: Recent Labs  Lab 12/18/23 0518 12/19/23 0341 12/21/23 0320 12/22/23 0349 12/23/23 0319 12/24/23 0327  WBC 8.8 8.5 10.4 12.5* 11.3* 12.2*  NEUTROABS 5.3 4.8  --   --   --   --   HGB 8.8* 8.9* 8.0* 7.8* 7.5* 8.0*  HCT 28.1* 28.3* 25.4* 24.6* 24.0* 25.7*  MCV 91.8 92.8 92.4 91.8 93.4 94.1  PLT 213 236 320 383 419* 517*   Basic Metabolic Panel: Recent Labs  Lab 12/18/23 0518 12/19/23 0341  NA 135 135  K 4.2 4.0  CL 100 102  CO2 24 21*  GLUCOSE 167* 166*  BUN 36* 36*  CREATININE 1.19* 1.00  CALCIUM 9.2 9.3  MG 1.9 1.9   GFR: Estimated Creatinine Clearance: 46.7 mL/min (by C-G formula based on SCr of 1 mg/dL). Liver Function Tests: No results for input(s): "AST", "ALT", "ALKPHOS", "BILITOT", "PROT", "ALBUMIN" in the last 168 hours. No results for input(s): "LIPASE", "AMYLASE" in the last 168 hours. No results for input(s): "AMMONIA" in the last 168 hours. Coagulation Profile: No results for input(s): "INR", "PROTIME" in the last 168 hours. Cardiac Enzymes: No results for input(s): "CKTOTAL", "CKMB", "CKMBINDEX", "TROPONINI" in the last 168 hours. BNP (last 3 results) No results for input(s): "PROBNP" in the last 8760 hours. HbA1C: No results for input(s): "HGBA1C" in the last 72 hours. CBG: Recent Labs  Lab 12/23/23 1156 12/23/23 1704 12/23/23 2123 12/24/23 0729 12/24/23 1147  GLUCAP 227* 198* 214* 187* 269*   Lipid Profile: No results for input(s): "CHOL", "HDL", "LDLCALC", "TRIG", "CHOLHDL", "LDLDIRECT" in the last 72 hours. Thyroid Function  Tests: No results for input(s): "TSH", "T4TOTAL", "FREET4", "T3FREE", "THYROIDAB" in the last 72 hours. Anemia Panel: Recent Labs    12/23/23 1017  FERRITIN 85  TIBC 361  IRON 65   Sepsis Labs: No results for input(s): "PROCALCITON", "LATICACIDVEN" in the last 168 hours.  No results found for this or any previous visit (from the past 240 hours).     Radiology Studies: No results found.    Scheduled Meds:  acetaminophen  650 mg Oral QID   amLODipine  10 mg Oral q morning   aspirin EC  81 mg Oral Daily   B-complex with vitamin C  1 tablet Oral Daily   calcium-vitamin D  1 tablet Oral Q breakfast   Chlorhexidine Gluconate Cloth  6 each Topical Daily   colesevelam  1,250 mg Oral BID WC   docusate sodium  100 mg  Oral BID   feeding supplement (GLUCERNA SHAKE)  237 mL Oral BID BM   ferrous sulfate  325 mg Oral Q breakfast   hydrochlorothiazide  12.5 mg Oral q morning   insulin aspart  0-15 Units Subcutaneous TID WC   insulin aspart  0-5 Units Subcutaneous QHS   insulin aspart  3 Units Subcutaneous TID WC   insulin glargine  10 Units Subcutaneous Daily   lisinopril  40 mg Oral q morning   multivitamin with minerals  1 tablet Oral q morning   polyethylene glycol  17 g Oral Daily   pravastatin  40 mg Oral QPC supper   Continuous Infusions:   LOS: 11 days   Time spent: 30 minutes   Noralee Stain, DO [facility] 12/24/2023, 1:07 PM   Available via Epic secure chat 7am-7pm After these hours, please refer to coverage provider listed on [email or web address]

## 2023-12-24 NOTE — TOC Progression Note (Signed)
 Transition of Care [facility]) - Progression Note    Patient Details  Name: Alexis Hopkins MRN: [id number] Date of Birth: [birth year]  Transition of [facility]) CM/SW Contact  Diona Browner, [place] Phone Number: 12/24/2023, 10:36 AM  Clinical Narrative:    Issue with insurance auth. CSW restarted insurance auth for pt SNF stay. TOC following.    Expected Discharge Plan:  (TBD\) Barriers to Discharge: Continued Medical Work up  Expected Discharge Plan and Services In-house Referral: Clinical Social Work Discharge Planning Services: NA Post Acute Care Choice: Home Health, Skilled Nursing Facility, Durable Medical Equipment Living arrangements for the past 2 months: Single Family Home                                       Social Determinants of Health (SDOH) Interventions SDOH Screenings   Food Insecurity: No Food Insecurity (12/13/2023)  Housing: Low Risk  (12/13/2023)  Transportation Needs: No Transportation Needs (12/13/2023)  Utilities: Not At Risk (12/13/2023)  Social Connections: Unknown (12/13/2023)  Tobacco Use: Low Risk  (12/14/2023)    Readmission Risk Interventions    12/14/2023   10:13 AM  Readmission Risk Prevention Plan  Post Dischage Appt Complete  Medication Screening Complete  Transportation Screening Complete

## 2023-12-24 NOTE — Progress Notes (Signed)
 Physical Therapy Treatment Patient Details Name: Alexis Hopkins MRN: [id number] DOB: [birth year] Today's Date: 12/24/2023   History of Present Illness Pt is 78 yo female admitted on 12/13/23 after fall on ramp at home found to have L femur fx.  She is s/p IM Nailing on 12/14/23.  Pt had low Hgb 3/15 and required 1 unit PRBC, and again  7.1 g/dL on 01/09/80 and to get another 1 unit PRBCs.  Pt with hx including but not limited to DM2, arthritis, BCC, HTN, HLD, TIA, Bil TKA, bil TSA    PT Comments  POD # 10 Seen after x ray results hip/knee.  Pt stated she felt a "pop" when moving this morning.  X Rays are negative for any hardware disruption.   Pt was OOB in recliner needing to use BSC.  Assisted with a transfer from recliner to [facility].  Pt had difficulty WBing thru L LE due to L lateral knee pain and some anxiety.  Pt on BSC extended time.  Did have a BM.  Assisted with peri care.  Attempted to amb however unable to progress past 2 feet due to increased L knee pain and fatigue.  Positioned in recliner to comfort and applied ICE.   Prior to admit, Pt was Indep/driving.   Pt will need ST Rehab at SNF to address mobility and functional decline prior to safely returning home.     If plan is discharge home, recommend the following: A little help with walking and/or transfers;A little help with bathing/dressing/bathroom;Assistance with cooking/housework;Assist for transportation;Help with stairs or ramp for entrance   Can travel by private vehicle     Yes  Equipment Recommendations  Rolling walker (2 wheels);Wheelchair cushion (measurements PT);Wheelchair (measurements PT);BSC/3in1;Other (comment)    Recommendations for Other Services       Precautions / Restrictions Precautions Precautions: Fall Precaution/Restrictions Comments: anemia Restrictions Weight Bearing Restrictions Per Provider Order: No Other Position/Activity Restrictions: WBAT with RW per ortho notes     Mobility  Bed  Mobility               General bed mobility comments: OOB in recliner    Transfers Overall transfer level: Needs assistance Equipment used: Rolling walker (2 wheels) Transfers: Sit to/from Stand Sit to Stand: Min assist, Contact guard assist           General transfer comment: initially light assist to rise, improved to CGA with cues for UE and LE positioning for pain control.  Also assisted with a BSC transfer.    Ambulation/Gait Ambulation/Gait assistance: Min assist, Contact guard assist Gait Distance (Feet): 2 Feet Assistive device: Rolling walker (2 wheels) Gait Pattern/deviations: Step-to pattern, Decreased stance time - left, Antalgic Gait velocity: decreased     General Gait Details: limited ability to advance L LE so pt utilizes gait belt around L foot and held at L hand to provide physical assist  "I can't put weight on that leg", stated Pt.  C/O left lateral knee pain.   Stairs             Wheelchair Mobility     Tilt Bed    Modified Rankin (Stroke Patients Only)       Balance                                            Communication Communication Communication: No apparent difficulties  Cognition Arousal: Alert Behavior During Therapy: WFL for tasks assessed/performed   PT - Cognitive impairments: No apparent impairments                       PT - Cognition Comments: AxO x 3 pleasant Lady who lives home with spouse when she was walking up her RAMP carrying multiple items when she fell. Daughter is a Engineer, civil (consulting) and Granddaughter is a Engineer, civil (consulting). Following commands: Intact      Cueing Cueing Techniques: Verbal cues  Exercises      General Comments        Pertinent Vitals/Pain Pain Assessment Pain Assessment: Faces Faces Pain Scale: Hurts even more Pain Location: L lateral knee Pain Descriptors / Indicators: Aching, Grimacing, Sharp Pain Intervention(s): Monitored during session, Repositioned, Ice applied     Home Living                          Prior Function            PT Goals (current goals can now be found in the care plan section) Progress towards PT goals: Progressing toward goals    Frequency    Min 2X/week      PT Plan      Co-evaluation              AM-PAC PT "6 Clicks" Mobility   Outcome Measure  Help needed turning from your back to your side while in a flat bed without using bedrails?: A Little Help needed moving from lying on your back to sitting on the side of a flat bed without using bedrails?: A Little Help needed moving to and from a bed to a chair (including a wheelchair)?: A Lot Help needed standing up from a chair using your arms (e.g., wheelchair or bedside chair)?: A Lot Help needed to walk in hospital room?: A Lot Help needed climbing 3-5 steps with a railing? : Total 6 Click Score: 13    End of Session Equipment Utilized During Treatment: Gait belt Activity Tolerance: Patient limited by pain Patient left: in chair;with call bell/phone within reach;with chair alarm set Nurse Communication: Mobility status PT Visit Diagnosis: Other abnormalities of gait and mobility (R26.89);Pain Pain - Right/Left: Left Pain - part of body: Hip     Time: 1430-1455 PT Time Calculation (min) (ACUTE ONLY): 25 min  Charges:    $Gait Training: 8-22 mins $Therapeutic Activity: 8-22 mins PT General Charges $$ ACUTE PT VISIT: 1 Visit                     Alexis Hopkins  PTA Acute  Rehabilitation Services Office M-F          [phone]

## 2023-12-24 NOTE — Plan of Care (Signed)
  Problem: Pain Managment: Goal: General experience of comfort will improve and/or be controlled Outcome: Progressing

## 2023-12-24 NOTE — Plan of Care (Signed)
  Problem: Education: Goal: Knowledge of General Education information will improve Description: Including pain rating scale, medication(s)/side effects and non-pharmacologic comfort measures Outcome: Progressing   Problem: Health Behavior/Discharge Planning: Goal: Ability to manage health-related needs will improve Outcome: Progressing   Problem: Clinical Measurements: Goal: Ability to maintain clinical measurements within normal limits will improve Outcome: Progressing Goal: Will remain free from infection Outcome: Progressing Goal: Diagnostic test results will improve Outcome: Progressing Goal: Respiratory complications will improve Outcome: Progressing Goal: Cardiovascular complication will be avoided Outcome: Progressing   Problem: Activity: Goal: Risk for activity intolerance will decrease Outcome: Progressing   Problem: Nutrition: Goal: Adequate nutrition will be maintained Outcome: Progressing   Problem: Coping: Goal: Level of anxiety will decrease Outcome: Progressing   Problem: Elimination: Goal: Will not experience complications related to bowel motility Outcome: Progressing Goal: Will not experience complications related to urinary retention Outcome: Progressing   Problem: Pain Managment: Goal: General experience of comfort will improve and/or be controlled Outcome: Progressing   Problem: Safety: Goal: Ability to remain free from injury will improve Outcome: Progressing   Problem: Skin Integrity: Goal: Risk for impaired skin integrity will decrease Outcome: Progressing   Problem: Education: Goal: Ability to describe self-care measures that may prevent or decrease complications (Diabetes Survival Skills Education) will improve Outcome: Progressing   Problem: Coping: Goal: Ability to adjust to condition or change in health will improve Outcome: Progressing   Problem: Fluid Volume: Goal: Ability to maintain a balanced intake and output will  improve Outcome: Progressing   Problem: Health Behavior/Discharge Planning: Goal: Ability to identify and utilize available resources and services will improve Outcome: Progressing Goal: Ability to manage health-related needs will improve Outcome: Progressing   Problem: Metabolic: Goal: Ability to maintain appropriate glucose levels will improve Outcome: Progressing   Problem: Nutritional: Goal: Maintenance of adequate nutrition will improve Outcome: Progressing Goal: Progress toward achieving an optimal weight will improve Outcome: Progressing   Problem: Skin Integrity: Goal: Risk for impaired skin integrity will decrease Outcome: Progressing   Problem: Tissue Perfusion: Goal: Adequacy of tissue perfusion will improve Outcome: Progressing   Problem: Education: Goal: Verbalization of understanding the information provided (i.e., activity precautions, restrictions, etc) will improve Outcome: Progressing   Problem: Activity: Goal: Ability to ambulate and perform ADLs will improve Outcome: Progressing   Problem: Clinical Measurements: Goal: Postoperative complications will be avoided or minimized Outcome: Progressing   Problem: Self-Concept: Goal: Ability to maintain and perform role responsibilities to the fullest extent possible will improve Outcome: Progressing   Problem: Pain Management: Goal: Pain level will decrease Outcome: Progressing

## 2023-12-25 DIAGNOSIS — S72002A Fracture of unspecified part of neck of left femur, initial encounter for closed fracture: Secondary | ICD-10-CM | POA: Diagnosis not present

## 2023-12-25 LAB — GLUCOSE, CAPILLARY
Glucose-Capillary: 190 mg/dL — ABNORMAL HIGH (ref 70–99)
Glucose-Capillary: 258 mg/dL — ABNORMAL HIGH (ref 70–99)
Glucose-Capillary: 140 mg/dL — ABNORMAL HIGH (ref 70–99)
Glucose-Capillary: 201 mg/dL — ABNORMAL HIGH (ref 70–99)

## 2023-12-25 LAB — CBC
HCT: 24.8 % — ABNORMAL LOW (ref 36.0–46.0)
Hemoglobin: 7.7 g/dL — ABNORMAL LOW (ref 12.0–15.0)
MCH: 29.1 pg (ref 26.0–34.0)
MCHC: 31 g/dL (ref 30.0–36.0)
MCV: 93.6 fL (ref 80.0–100.0)
Platelets: 522 10*3/uL — ABNORMAL HIGH (ref 150–400)
RBC: 2.65 MIL/uL — ABNORMAL LOW (ref 3.87–5.11)
RDW: 16.2 % — ABNORMAL HIGH (ref 11.5–15.5)
WBC: 12.7 10*3/uL — ABNORMAL HIGH (ref 4.0–10.5)
nRBC: 0.4 % — ABNORMAL HIGH (ref 0.0–0.2)

## 2023-12-25 NOTE — Plan of Care (Signed)
   Problem: Education: Goal: Knowledge of General Education information will improve Description Including pain rating scale, medication(s)/side effects and non-pharmacologic comfort measures Outcome: Progressing

## 2023-12-25 NOTE — Progress Notes (Signed)
    Subjective:  Patient reports pain as mild to moderate.  Denies N/V/CP/SOB/Abd pain. She reports that she had some increased knee pain with PT yesterday. She had x-rays of her left hip and knee. No acute changes noted. She denies any tingling or numbness in LE bilaterally.   Objective:   VITALS:   Vitals:   12/24/23 1306 12/24/23 2140 12/25/23 0601 12/25/23 0820  BP: (!) 142/57 129/65 126/87 (!) 132/55  Pulse: 92 79 99   Resp: 18 18 18    Temp: 98.5 F (36.9 C) 98.1 F (36.7 C) 97.8 F (36.6 C)   TempSrc:  Oral Oral   SpO2: 98% 99% 99%   Weight:      Height:        NAD Neurologically intact ABD soft Neurovascular intact Sensation intact distally Intact pulses distally Dorsiflexion/Plantar flexion intact No cellulitis present Compartment soft Aquacel dressings C/D/I x3.  Examination of her left knee reveals healed anterior incision. No pain with palpation over the tibial or femoral tray. No pain with patellar movement. No warmth, redness, erythema or swelling noted. Exam limited due to patient guarding. No extensor lag noted.   Lab Results  Component Value Date   WBC 12.7 (H) 12/25/2023   HGB 7.7 (L) 12/25/2023   HCT 24.8 (L) 12/25/2023   MCV 93.6 12/25/2023   PLT 522 (H) 12/25/2023   BMET    Component Value Date/Time   NA 135 12/19/2023 0341   K 4.0 12/19/2023 0341   CL 102 12/19/2023 0341   CO2 21 (L) 12/19/2023 0341   GLUCOSE 166 (H) 12/19/2023 0341   BUN 36 (H) 12/19/2023 0341   CREATININE 1.00 12/19/2023 0341   CALCIUM 9.3 12/19/2023 0341   GFRNONAA 58 (L) 12/19/2023 0341     Assessment/Plan: 11 Days Post-Op   Principal Problem:   Closed left hip fracture (HCC) Active Problems:   Obese   Essential hypertension   Diabetes mellitus (HCC)   History of TIA (transient ischemic attack)   ABLA (acute blood loss anemia)   Acute urinary retention   WBAT with walker DVT ppx: Aspirin, SCDs, TEDS PO pain control PT/OT: continue PT/OT.  Dispo:  -  Patient under care of the medical team, disposition per their recommendation. D/c to SNF. Pain medication printed in chart. Aspirin 81mg  BID x 6 weeks for DVT ppx.  - Patient guarding during knee exam, x-rays with no acute findings. Patient can try PT today without devices. Can consider knee brace for comfort.    Clois Dupes 12/25/2023, 11:54 AM   [facility]  [facility] [address], [address], [place], [place] [address] Phone: [phone] [email or web address] Facebook  [employer]

## 2023-12-25 NOTE — Progress Notes (Signed)
 Orthopedic Tech Progress Note Patient Details:  Alexis Hopkins Jan 29, 1946 [id number]  Ortho Devices Type of Ortho Device: Knee Sleeve Ortho Device/Splint Location: left Ortho Device/Splint Interventions: Ordered, Application, Adjustment   Post Interventions Patient Tolerated: Well Instructions Provided: Adjustment of device, Care of device  Kizzie Fantasia 12/25/2023, 12:23 PM

## 2023-12-25 NOTE — Progress Notes (Signed)
 PROGRESS NOTE    Alexis Hopkins  WUJ:[id number] DOB: 04/25/1946 DOA: 12/13/2023 PCP: Lupita Raider, MD     Brief Narrative:  Alexis Hopkins is a 78 year old female with PMH DM II, arthritis, BCC, HTN, HLD, TIA who presented initially to [facility] after a mechanical fall at home. She was walking up the ramp at her house and stumbled after her foot got caught. She was found to have left femur fracture and was transferred to [facility] for operative repair with orthopedic surgery.  She underwent IM nail fixation 12/14/2023.  Hospitalization prolonged due to blood loss anemia requiring blood transfusions, urinary retention requiring Foley placement, as well as pain and slow progress with physical therapy.  Currently awaiting SNF placement.  New events last 24 hours / Subjective: Patient still having significant left knee pain, can hardly put weight on LLE.  Discussed with orthopedic surgery PA, who came to evaluate patient today.  Recommended against any sort of cortisone injection in setting of prosthesis in left knee joint.  Could trial knee brace for comfort.  Awaiting SNF placement insurance authorization.  Assessment & Plan:   Principal Problem:   Closed left hip fracture (HCC) Active Problems:   Diabetes mellitus (HCC)   ABLA (acute blood loss anemia)   Acute urinary retention   Obese   Essential hypertension   History of TIA (transient ischemic attack)    Closed left hip fracture -Status post mechanical fall.  Underwent IM nail fixation by Dr. Linna Caprice 3/14 -Aspirin for DVT prophylaxis.  Continue SCDs -PT OT recommended SNF placement.  Placement pending, awaiting insurance auth -Discussed with orthopedic PA today regarding patient's left knee pain, ordered knee brace for comfort  -Continue PT today  Acute blood loss anemia following surgery -Received total 2 unit pRBC transfusion -Continue to monitor, transfuse for hemoglobin <7 -Saw [facility] in the past. No current  sign of GI bleeding. Ferritin and iron/TIBC level within normal. FOBT negative.  -Iron supplement ordered  -Continue baby aspirin for TIA and DVT ppx  -Trend CBC, stable in high 7s-8 range   Acute urinary retention -Failed voiding trial 3/16 and Foley catheter was replaced -Removed foley 3/23. Now voiding well   Diabetes mellitus -Lantus, NovoLog, sliding scale insulin  History of TIA -Aspirin, Plavix, pravastatin PTA -Discussed with patient. She had TIA decades ago and has been taking DAPT for many years. We discussed stopping plavix at this point and continuing baby aspirin only   CKD Stage IIIa -Baseline creatinine around 1 -Stable  Hypertension -Norvasc, hydrochlorothiazide, lisinopril   Obesity -BMI 32    DVT prophylaxis:  SCDs Start: 12/14/23 1703 SCDs Start: 12/13/23 1730  Code Status: Full code Family Communication: Discussed with daughter today Disposition Plan: SNF Status is: Inpatient Remains inpatient appropriate because: SNF placement pending, auth pending, continue PT    Antimicrobials:  Anti-infectives (From admission, onward)    Start     Dose/Rate Route Frequency Ordered Stop   12/15/23 0600  ceFAZolin (ANCEF) IVPB 2g/100 mL premix        2 g 200 mL/hr over 30 Minutes Intravenous On call to O.R. 12/14/23 1219 12/14/23 1330   12/14/23 1930  ceFAZolin (ANCEF) IVPB 2g/100 mL premix        2 g 200 mL/hr over 30 Minutes Intravenous Every 6 hours 12/14/23 1702 12/15/23 0333   12/14/23 1221  ceFAZolin (ANCEF) 2-4 GM/100ML-% IVPB       Note to Pharmacy: Blair Promise B: cabinet override  12/14/23 1221 12/14/23 1330        Objective: Vitals:   12/24/23 1306 12/24/23 2140 12/25/23 0601 12/25/23 0820  BP: (!) 142/57 129/65 126/87 (!) 132/55  Pulse: 92 79 99   Resp: 18 18 18    Temp: 98.5 F (36.9 C) 98.1 F (36.7 C) 97.8 F (36.6 C)   TempSrc:  Oral Oral   SpO2: 98% 99% 99%   Weight:      Height:        Intake/Output Summary (Last  24 hours) at 12/25/2023 1051 Last data filed at 12/24/2023 1300 Gross per 24 hour  Intake 120 ml  Output --  Net 120 ml   Filed Weights   12/13/23 1502 12/14/23 1228  Weight: 79.4 kg 79.4 kg    Examination:  General exam: Appears calm and comfortable  Respiratory system: Respiratory effort normal. No respiratory distress. No conversational dyspnea.  Central nervous system: Alert and oriented.  Psychiatry: Judgement and insight appear normal. Mood & affect appropriate.   Data Reviewed: I have personally reviewed following labs and imaging studies  CBC: Recent Labs  Lab 12/19/23 0341 12/21/23 0320 12/22/23 0349 12/23/23 0319 12/24/23 0327 12/25/23 0331  WBC 8.5 10.4 12.5* 11.3* 12.2* 12.7*  NEUTROABS 4.8  --   --   --   --   --   HGB 8.9* 8.0* 7.8* 7.5* 8.0* 7.7*  HCT 28.3* 25.4* 24.6* 24.0* 25.7* 24.8*  MCV 92.8 92.4 91.8 93.4 94.1 93.6  PLT 236 320 383 419* 517* 522*   Basic Metabolic Panel: Recent Labs  Lab 12/19/23 0341  NA 135  K 4.0  CL 102  CO2 21*  GLUCOSE 166*  BUN 36*  CREATININE 1.00  CALCIUM 9.3  MG 1.9   GFR: Estimated Creatinine Clearance: 46.7 mL/min (by C-G formula based on SCr of 1 mg/dL). Liver Function Tests: No results for input(s): "AST", "ALT", "ALKPHOS", "BILITOT", "PROT", "ALBUMIN" in the last 168 hours. No results for input(s): "LIPASE", "AMYLASE" in the last 168 hours. No results for input(s): "AMMONIA" in the last 168 hours. Coagulation Profile: No results for input(s): "INR", "PROTIME" in the last 168 hours. Cardiac Enzymes: No results for input(s): "CKTOTAL", "CKMB", "CKMBINDEX", "TROPONINI" in the last 168 hours. BNP (last 3 results) No results for input(s): "PROBNP" in the last 8760 hours. HbA1C: No results for input(s): "HGBA1C" in the last 72 hours. CBG: Recent Labs  Lab 12/24/23 0729 12/24/23 1147 12/24/23 1609 12/24/23 2138 12/25/23 0747  GLUCAP 187* 269* 223* 237* 190*   Lipid Profile: No results for input(s):  "CHOL", "HDL", "LDLCALC", "TRIG", "CHOLHDL", "LDLDIRECT" in the last 72 hours. Thyroid Function Tests: No results for input(s): "TSH", "T4TOTAL", "FREET4", "T3FREE", "THYROIDAB" in the last 72 hours. Anemia Panel: Recent Labs    12/23/23 1017  FERRITIN 85  TIBC 361  IRON 65   Sepsis Labs: No results for input(s): "PROCALCITON", "LATICACIDVEN" in the last 168 hours.  No results found for this or any previous visit (from the past 240 hours).     Radiology Studies: DG Knee Complete 4 Views Left Result Date: 12/24/2023 CLINICAL DATA:  Knee pain EXAM: LEFT KNEE - COMPLETE 4+ VIEW COMPARISON:  12/13/2023 FINDINGS: Prior left total knee arthroplasty. No periprosthetic lucency or fracture. No knee joint effusion. New intramedullary rod and distal interlocking screw within the femur. No focal soft tissue swelling about the knee. IMPRESSION: Prior left total knee arthroplasty without evidence of hardware complication. Electronically Signed   By: Duanne Guess D.O.  On: 12/24/2023 13:48   DG HIP UNILAT WITH PELVIS 1V LEFT Result Date: 12/24/2023 CLINICAL DATA:  [id number] Knee pain 125018 Leg pain. EXAM: DG HIP (WITH OR WITHOUT PELVIS) 1V*L* COMPARISON:  Left femur radiographs 12/14/2023. FINDINGS: AP and cross-table lateral views of the proximal left femur. The distal left femur is imaged on the knee radiographs, dictated separately. Intact hardware status post left femoral intramedullary nail and dynamic screw fixation of a comminuted subtrochanteric femur fracture. The visualized hardware is intact without loosening. The alignment of the fracture is stable. Lateral skin staples are in place without suspicious soft tissue findings. IMPRESSION: Stable alignment of comminuted subtrochanteric left femur fracture status post ORIF. No evidence of hardware complication. See separate examination of the left knee. Electronically Signed   By: Carey Bullocks M.D.   On: 12/24/2023 13:48      Scheduled  Meds:  acetaminophen  650 mg Oral QID   amLODipine  10 mg Oral q morning   aspirin EC  81 mg Oral Daily   B-complex with vitamin C  1 tablet Oral Daily   calcium-vitamin D  1 tablet Oral Q breakfast   Chlorhexidine Gluconate Cloth  6 each Topical Daily   colesevelam  1,250 mg Oral BID WC   docusate sodium  100 mg Oral BID   feeding supplement (GLUCERNA SHAKE)  237 mL Oral BID BM   ferrous sulfate  325 mg Oral Q breakfast   hydrochlorothiazide  12.5 mg Oral q morning   insulin aspart  0-15 Units Subcutaneous TID WC   insulin aspart  0-5 Units Subcutaneous QHS   insulin aspart  3 Units Subcutaneous TID WC   insulin glargine  10 Units Subcutaneous Daily   lisinopril  40 mg Oral q morning   multivitamin with minerals  1 tablet Oral q morning   polyethylene glycol  17 g Oral Daily   pravastatin  40 mg Oral QPC supper   Continuous Infusions:   LOS: 12 days   Time spent: 30 minutes   Noralee Stain, DO [facility] 12/25/2023, 10:51 AM   Available via Epic secure chat 7am-7pm After these hours, please refer to coverage provider listed on [email or web address]

## 2023-12-25 NOTE — Progress Notes (Signed)
 Physical Therapy Treatment Patient Details Name: Alexis Hopkins MRN: [id number] DOB: November 26, 1945 Today's Date: 12/25/2023   History of Present Illness Pt is 78 yo female admitted on 12/13/23 after fall on ramp at home found to have L femur fx.  She is s/p IM Nailing on 12/14/23.  Pt had low Hgb 3/15 and required 1 unit PRBC, and again  7.1 g/dL on 03/22/29 and to get another 1 unit PRBCs.  Pt with hx including but not limited to DM2, arthritis, BCC, HTN, HLD, TIA, Bil TKA, bil TSA    PT Comments  POD # 11 Pt continues to be unable to tolerate WBing thru her L LE "ever since my knee popped when they were moving me".  Pt has WNL knee ROM with no pain.  C/O lateral knee pain with weight bearing so much that she is unable to amb.   General transfer comment: initially light assist to rise, improved to CGA with cues for UE.  Unable to WB through L LE due to lateral knee pain. General Gait Details: limited ability to advance L LE so pt utilizes gait belt around L foot and held at L hand to provide physical assist  "I can't put weight on that leg", stated Pt.  C/O left lateral knee pain.  wearing L knee sleeve.   If plan is discharge home, recommend the following: A little help with walking and/or transfers;A little help with bathing/dressing/bathroom;Assistance with cooking/housework;Assist for transportation;Help with stairs or ramp for entrance   Can travel by private vehicle     Yes  Equipment Recommendations  Rolling walker (2 wheels);Wheelchair cushion (measurements PT);Wheelchair (measurements PT);BSC/3in1;Other (comment)    Recommendations for Other Services       Precautions / Restrictions Precautions Precautions: Fall Recall of Precautions/Restrictions: Intact Precaution/Restrictions Comments: anemia Required Braces or Orthoses: Other Brace Other Brace: L knee sleeve for comfort Restrictions Weight Bearing Restrictions Per Provider Order: No LLE Weight Bearing Per Provider Order:  Weight bearing as tolerated Other Position/Activity Restrictions: WBAT with RW per ortho notes     Mobility  Bed Mobility               General bed mobility comments: OOB in recliner    Transfers Overall transfer level: Needs assistance Equipment used: Rolling walker (2 wheels) Transfers: Sit to/from Stand Sit to Stand: Min assist, Contact guard assist           General transfer comment: initially light assist to rise, improved to CGA with cues for UE.  Unable to WB through L LE due to lateral knee pain.    Ambulation/Gait Ambulation/Gait assistance: Min assist Gait Distance (Feet): 2 Feet Assistive device: Rolling walker (2 wheels) Gait Pattern/deviations: Step-to pattern, Decreased stance time - left, Antalgic Gait velocity: decreased     General Gait Details: limited ability to advance L LE so pt utilizes gait belt around L foot and held at L hand to provide physical assist  "I can't put weight on that leg", stated Pt.  C/O left lateral knee pain.  wearing L knee sleeve.   Stairs             Wheelchair Mobility     Tilt Bed    Modified Rankin (Stroke Patients Only)       Balance  Communication    Cognition   Behavior During Therapy: WFL for tasks assessed/performed   PT - Cognitive impairments: No apparent impairments                       PT - Cognition Comments: AxO x 3 pleasant Lady who lives home with spouse when she was walking up her RAMP carrying multiple items when she fell. Daughter is a Engineer, civil (consulting) and Granddaughter is a Engineer, civil (consulting). Following commands: Intact      Cueing Cueing Techniques: Verbal cues  Exercises      General Comments        Pertinent Vitals/Pain Pain Assessment Pain Assessment: Faces Faces Pain Scale: Hurts whole lot Pain Location: L lateral knee Pain Descriptors / Indicators: Aching, Grimacing, Sharp Pain Intervention(s): Monitored during  session, Premedicated before session, Repositioned, Ice applied    Home Living                          Prior Function            PT Goals (current goals can now be found in the care plan section) Progress towards PT goals: Progressing toward goals    Frequency    Min 5X/week      PT Plan      Co-evaluation              AM-PAC PT "6 Clicks" Mobility   Outcome Measure  Help needed turning from your back to your side while in a flat bed without using bedrails?: A Little Help needed moving from lying on your back to sitting on the side of a flat bed without using bedrails?: A Little Help needed moving to and from a bed to a chair (including a wheelchair)?: A Lot Help needed standing up from a chair using your arms (e.g., wheelchair or bedside chair)?: A Lot Help needed to walk in hospital room?: A Lot   6 Click Score: 12    End of Session Equipment Utilized During Treatment: Gait belt Activity Tolerance: Patient limited by pain Patient left: in chair;with call bell/phone within reach;with chair alarm set Nurse Communication: Mobility status PT Visit Diagnosis: Other abnormalities of gait and mobility (R26.89);Pain Pain - Right/Left: Left Pain - part of body: Knee     Time: 1610-9604 PT Time Calculation (min) (ACUTE ONLY): 25 min  Charges:    $Gait Training: 8-22 mins $Therapeutic Activity: 8-22 mins PT General Charges $$ ACUTE PT VISIT: 1 Visit                     Felecia Shelling  PTA Acute  Rehabilitation Services Office M-F          [phone]

## 2023-12-25 NOTE — TOC Progression Note (Signed)
 Transition of Care [facility]) - Progression Note    Patient Details  Name: Alexis Hopkins MRN: [id number] Date of Birth: [birth year]  Transition of Care [facility]) CM/SW Contact  Amada Jupiter, LCSW Phone Number: 12/25/2023, 12:54 PM  Clinical Narrative:     Have received insurance authorization for [facility] admission Berkley Harvey #[id number]).  MD anticipates pt should be medically ready tomorrow - pt/dtr/ facility all aware and agreeable.  Expected Discharge Plan:  (TBD\) Barriers to Discharge: Continued Medical Work up  Expected Discharge Plan and Services In-house Referral: Clinical Social Work Discharge Planning Services: NA Post Acute Care Choice: Home Health, Skilled Nursing Facility, Durable Medical Equipment Living arrangements for the past 2 months: Single Family Home                                       Social Determinants of Health (SDOH) Interventions SDOH Screenings   Food Insecurity: No Food Insecurity (12/13/2023)  Housing: Low Risk  (12/13/2023)  Transportation Needs: No Transportation Needs (12/13/2023)  Utilities: Not At Risk (12/13/2023)  Social Connections: Unknown (12/13/2023)  Tobacco Use: Low Risk  (12/14/2023)    Readmission Risk Interventions    12/14/2023   10:13 AM  Readmission Risk Prevention Plan  Post Dischage Appt Complete  Medication Screening Complete  Transportation Screening Complete

## 2023-12-26 DIAGNOSIS — K219 Gastro-esophageal reflux disease without esophagitis: Secondary | ICD-10-CM | POA: Diagnosis present

## 2023-12-26 DIAGNOSIS — K259 Gastric ulcer, unspecified as acute or chronic, without hemorrhage or perforation: Secondary | ICD-10-CM | POA: Diagnosis not present

## 2023-12-26 DIAGNOSIS — Z743 Need for continuous supervision: Secondary | ICD-10-CM | POA: Diagnosis not present

## 2023-12-26 DIAGNOSIS — R531 Weakness: Secondary | ICD-10-CM | POA: Diagnosis not present

## 2023-12-26 DIAGNOSIS — Z96651 Presence of right artificial knee joint: Secondary | ICD-10-CM | POA: Diagnosis not present

## 2023-12-26 DIAGNOSIS — Z96642 Presence of left artificial hip joint: Secondary | ICD-10-CM | POA: Diagnosis present

## 2023-12-26 DIAGNOSIS — E782 Mixed hyperlipidemia: Secondary | ICD-10-CM | POA: Diagnosis not present

## 2023-12-26 DIAGNOSIS — D631 Anemia in chronic kidney disease: Secondary | ICD-10-CM | POA: Diagnosis present

## 2023-12-26 DIAGNOSIS — Z6830 Body mass index (BMI) 30.0-30.9, adult: Secondary | ICD-10-CM | POA: Diagnosis not present

## 2023-12-26 DIAGNOSIS — S72142D Displaced intertrochanteric fracture of left femur, subsequent encounter for closed fracture with routine healing: Secondary | ICD-10-CM | POA: Diagnosis not present

## 2023-12-26 DIAGNOSIS — K2100 Gastro-esophageal reflux disease with esophagitis, without bleeding: Secondary | ICD-10-CM | POA: Diagnosis not present

## 2023-12-26 DIAGNOSIS — Z96652 Presence of left artificial knee joint: Secondary | ICD-10-CM | POA: Diagnosis not present

## 2023-12-26 DIAGNOSIS — Z7401 Bed confinement status: Secondary | ICD-10-CM | POA: Diagnosis not present

## 2023-12-26 DIAGNOSIS — R278 Other lack of coordination: Secondary | ICD-10-CM | POA: Diagnosis not present

## 2023-12-26 DIAGNOSIS — E785 Hyperlipidemia, unspecified: Secondary | ICD-10-CM | POA: Diagnosis present

## 2023-12-26 DIAGNOSIS — K922 Gastrointestinal hemorrhage, unspecified: Secondary | ICD-10-CM | POA: Diagnosis present

## 2023-12-26 DIAGNOSIS — I10 Essential (primary) hypertension: Secondary | ICD-10-CM | POA: Diagnosis not present

## 2023-12-26 DIAGNOSIS — R5381 Other malaise: Secondary | ICD-10-CM | POA: Diagnosis not present

## 2023-12-26 DIAGNOSIS — D72829 Elevated white blood cell count, unspecified: Secondary | ICD-10-CM | POA: Diagnosis not present

## 2023-12-26 DIAGNOSIS — R600 Localized edema: Secondary | ICD-10-CM | POA: Diagnosis not present

## 2023-12-26 DIAGNOSIS — S72142A Displaced intertrochanteric fracture of left femur, initial encounter for closed fracture: Secondary | ICD-10-CM | POA: Diagnosis not present

## 2023-12-26 DIAGNOSIS — K449 Diaphragmatic hernia without obstruction or gangrene: Secondary | ICD-10-CM | POA: Diagnosis present

## 2023-12-26 DIAGNOSIS — N1832 Chronic kidney disease, stage 3b: Secondary | ICD-10-CM | POA: Diagnosis present

## 2023-12-26 DIAGNOSIS — K921 Melena: Secondary | ICD-10-CM | POA: Diagnosis not present

## 2023-12-26 DIAGNOSIS — Z7982 Long term (current) use of aspirin: Secondary | ICD-10-CM | POA: Diagnosis not present

## 2023-12-26 DIAGNOSIS — N1831 Chronic kidney disease, stage 3a: Secondary | ICD-10-CM | POA: Diagnosis not present

## 2023-12-26 DIAGNOSIS — E875 Hyperkalemia: Secondary | ICD-10-CM | POA: Diagnosis present

## 2023-12-26 DIAGNOSIS — K5900 Constipation, unspecified: Secondary | ICD-10-CM | POA: Diagnosis not present

## 2023-12-26 DIAGNOSIS — E1122 Type 2 diabetes mellitus with diabetic chronic kidney disease: Secondary | ICD-10-CM | POA: Diagnosis not present

## 2023-12-26 DIAGNOSIS — M6259 Muscle wasting and atrophy, not elsewhere classified, multiple sites: Secondary | ICD-10-CM | POA: Diagnosis not present

## 2023-12-26 DIAGNOSIS — E1169 Type 2 diabetes mellitus with other specified complication: Secondary | ICD-10-CM | POA: Diagnosis not present

## 2023-12-26 DIAGNOSIS — N179 Acute kidney failure, unspecified: Secondary | ICD-10-CM | POA: Diagnosis not present

## 2023-12-26 DIAGNOSIS — Z85828 Personal history of other malignant neoplasm of skin: Secondary | ICD-10-CM | POA: Diagnosis not present

## 2023-12-26 DIAGNOSIS — R601 Generalized edema: Secondary | ICD-10-CM | POA: Diagnosis not present

## 2023-12-26 DIAGNOSIS — Z8781 Personal history of (healed) traumatic fracture: Secondary | ICD-10-CM | POA: Diagnosis not present

## 2023-12-26 DIAGNOSIS — K254 Chronic or unspecified gastric ulcer with hemorrhage: Secondary | ICD-10-CM | POA: Diagnosis present

## 2023-12-26 DIAGNOSIS — E119 Type 2 diabetes mellitus without complications: Secondary | ICD-10-CM | POA: Diagnosis not present

## 2023-12-26 DIAGNOSIS — D649 Anemia, unspecified: Secondary | ICD-10-CM | POA: Diagnosis not present

## 2023-12-26 DIAGNOSIS — Z96653 Presence of artificial knee joint, bilateral: Secondary | ICD-10-CM | POA: Diagnosis present

## 2023-12-26 DIAGNOSIS — H04129 Dry eye syndrome of unspecified lacrimal gland: Secondary | ICD-10-CM | POA: Diagnosis not present

## 2023-12-26 DIAGNOSIS — D539 Nutritional anemia, unspecified: Secondary | ICD-10-CM | POA: Diagnosis present

## 2023-12-26 DIAGNOSIS — Z8673 Personal history of transient ischemic attack (TIA), and cerebral infarction without residual deficits: Secondary | ICD-10-CM | POA: Diagnosis not present

## 2023-12-26 DIAGNOSIS — S72002A Fracture of unspecified part of neck of left femur, initial encounter for closed fracture: Secondary | ICD-10-CM | POA: Diagnosis not present

## 2023-12-26 DIAGNOSIS — R109 Unspecified abdominal pain: Secondary | ICD-10-CM | POA: Diagnosis not present

## 2023-12-26 DIAGNOSIS — Z79899 Other long term (current) drug therapy: Secondary | ICD-10-CM | POA: Diagnosis not present

## 2023-12-26 DIAGNOSIS — W19XXXD Unspecified fall, subsequent encounter: Secondary | ICD-10-CM | POA: Diagnosis not present

## 2023-12-26 DIAGNOSIS — Z7984 Long term (current) use of oral hypoglycemic drugs: Secondary | ICD-10-CM | POA: Diagnosis not present

## 2023-12-26 DIAGNOSIS — D62 Acute posthemorrhagic anemia: Secondary | ICD-10-CM | POA: Diagnosis not present

## 2023-12-26 DIAGNOSIS — N189 Chronic kidney disease, unspecified: Secondary | ICD-10-CM | POA: Diagnosis not present

## 2023-12-26 DIAGNOSIS — I499 Cardiac arrhythmia, unspecified: Secondary | ICD-10-CM | POA: Diagnosis not present

## 2023-12-26 DIAGNOSIS — G90512 Complex regional pain syndrome I of left upper limb: Secondary | ICD-10-CM | POA: Diagnosis present

## 2023-12-26 DIAGNOSIS — Z741 Need for assistance with personal care: Secondary | ICD-10-CM | POA: Diagnosis not present

## 2023-12-26 DIAGNOSIS — R8271 Bacteriuria: Secondary | ICD-10-CM | POA: Diagnosis present

## 2023-12-26 DIAGNOSIS — R262 Difficulty in walking, not elsewhere classified: Secondary | ICD-10-CM | POA: Diagnosis not present

## 2023-12-26 DIAGNOSIS — M810 Age-related osteoporosis without current pathological fracture: Secondary | ICD-10-CM | POA: Diagnosis present

## 2023-12-26 DIAGNOSIS — K2101 Gastro-esophageal reflux disease with esophagitis, with bleeding: Secondary | ICD-10-CM | POA: Diagnosis present

## 2023-12-26 DIAGNOSIS — M19011 Primary osteoarthritis, right shoulder: Secondary | ICD-10-CM | POA: Diagnosis not present

## 2023-12-26 DIAGNOSIS — R338 Other retention of urine: Secondary | ICD-10-CM | POA: Diagnosis not present

## 2023-12-26 DIAGNOSIS — R110 Nausea: Secondary | ICD-10-CM | POA: Diagnosis not present

## 2023-12-26 DIAGNOSIS — D72825 Bandemia: Secondary | ICD-10-CM | POA: Diagnosis present

## 2023-12-26 DIAGNOSIS — E66811 Obesity, class 1: Secondary | ICD-10-CM | POA: Diagnosis present

## 2023-12-26 DIAGNOSIS — R339 Retention of urine, unspecified: Secondary | ICD-10-CM | POA: Diagnosis not present

## 2023-12-26 DIAGNOSIS — I129 Hypertensive chronic kidney disease with stage 1 through stage 4 chronic kidney disease, or unspecified chronic kidney disease: Secondary | ICD-10-CM | POA: Diagnosis present

## 2023-12-26 LAB — CBC
HCT: 26.7 % — ABNORMAL LOW (ref 36.0–46.0)
Hemoglobin: 7.9 g/dL — ABNORMAL LOW (ref 12.0–15.0)
MCH: 29.2 pg (ref 26.0–34.0)
MCHC: 29.6 g/dL — ABNORMAL LOW (ref 30.0–36.0)
MCV: 98.5 fL (ref 80.0–100.0)
Platelets: 558 10*3/uL — ABNORMAL HIGH (ref 150–400)
RBC: 2.71 MIL/uL — ABNORMAL LOW (ref 3.87–5.11)
RDW: 17.1 % — ABNORMAL HIGH (ref 11.5–15.5)
WBC: 11.4 10*3/uL — ABNORMAL HIGH (ref 4.0–10.5)
nRBC: 0.3 % — ABNORMAL HIGH (ref 0.0–0.2)

## 2023-12-26 LAB — BASIC METABOLIC PANEL
Anion gap: 11 (ref 5–15)
BUN: 46 mg/dL — ABNORMAL HIGH (ref 8–23)
CO2: 22 mmol/L (ref 22–32)
Calcium: 8.7 mg/dL — ABNORMAL LOW (ref 8.9–10.3)
Chloride: 101 mmol/L (ref 98–111)
Creatinine, Ser: 1.49 mg/dL — ABNORMAL HIGH (ref 0.44–1.00)
GFR, Estimated: 36 mL/min — ABNORMAL LOW (ref >60)
Glucose, Bld: 167 mg/dL — ABNORMAL HIGH (ref 70–99)
Potassium: 4.3 mmol/L (ref 3.5–5.1)
Sodium: 134 mmol/L — ABNORMAL LOW (ref 135–145)

## 2023-12-26 LAB — GLUCOSE, CAPILLARY
Glucose-Capillary: 188 mg/dL — ABNORMAL HIGH (ref 70–99)
Glucose-Capillary: 257 mg/dL — ABNORMAL HIGH (ref 70–99)

## 2023-12-26 MED ORDER — FERROUS SULFATE 325 (65 FE) MG PO TABS: 325.0000 mg | ORAL_TABLET | Freq: Every day | ORAL | Status: AC

## 2023-12-26 MED ORDER — METHOCARBAMOL 500 MG PO TABS: 500.0000 mg | ORAL_TABLET | Freq: Three times a day (TID) | ORAL | 0 refills | Status: DC | PRN

## 2023-12-26 NOTE — Progress Notes (Signed)
 Physical Therapy Treatment Patient Details Name: Alexis Hopkins MRN: [id number] DOB: [birth year] Today's Date: 12/26/2023   History of Present Illness Pt is 78 yo female admitted on 12/13/23 after fall on ramp at home found to have L femur fx.  She is s/p IM Nailing on 12/14/23.  Pt had low Hgb 3/15 and required 1 unit PRBC, and again  7.1 g/dL on 05/31/55 and to get another 1 unit PRBCs.  Pt with hx including but not limited to DM2, arthritis, BCC, HTN, HLD, TIA, Bil TKA, bil TSA    PT Comments  Pt reports she is able to move her L LE a little better today. I spoke with the ortho dr and she reinforced the knee pain she is feeling is most likely coming from the hip and knee components look great on xray. I spent a lengthy time educating the patient on the need for using her left hip/knee muscles and encouraging WB rather than using devices to move the leg when in therapy. Pt was able to initiate swing phase with her left LE and focused on better positioning. Reinforced L LE HEP and gave a handout for later use. Pt was able to complete LE there ex with min assist. Pt will continue to benefit from acute skilled PT to maximize mobility and independence for the next venue of care.    If plan is discharge home, recommend the following: A little help with walking and/or transfers;A little help with bathing/dressing/bathroom;Assistance with cooking/housework;Assist for transportation;Help with stairs or ramp for entrance   Can travel by private vehicle     Yes  Equipment Recommendations       Recommendations for Other Services       Precautions / Restrictions Precautions Precautions: Fall Precaution/Restrictions Comments: anemia Required Braces or Orthoses: Other Brace Other Brace: L knee sleeve for comfort Restrictions Weight Bearing Restrictions Per Provider Order: No     Mobility  Bed Mobility Overal bed mobility: Needs Assistance Bed Mobility: Supine to Sit     Supine to sit: Min  assist, HOB elevated, Used rails     General bed mobility comments: assistance to move L LE over to edge of bed    Transfers Overall transfer level: Needs assistance Equipment used: Rolling walker (2 wheels) Transfers: Sit to/from Stand Sit to Stand: Contact guard assist, From elevated surface           General transfer comment: encouraged increased WB on L LE    Ambulation/Gait Ambulation/Gait assistance: Contact guard assist Gait Distance (Feet): 2 Feet (4 trials for 2 ft and sitting rest breaks) Assistive device: Rolling walker (2 wheels) Gait Pattern/deviations: Step-to pattern, Antalgic, Decreased weight shift to left, Decreased dorsiflexion - left, Decreased stance time - left, Decreased step length - right Gait velocity: decreased     General Gait Details: reinforced WB on L LE, encouraged more neutral LE postion during swing phase of gait and r side weight shift to off load L LE to assist with swing phase. Pt was able to independently step and advance L foot, but fatigued quickly. Pt with decreased DF noted and no heel strike on initial contact with L LE.   Stairs             Wheelchair Mobility     Tilt Bed    Modified Rankin (Stroke Patients Only)       Balance Overall balance assessment: Needs assistance Sitting-balance support: Bilateral upper extremity supported, Feet supported Sitting balance-Leahy Scale: Good  Standing balance support: Bilateral upper extremity supported, During functional activity, Reliant on assistive device for balance Standing balance-Leahy Scale: Poor                              Communication Communication Communication: No apparent difficulties  Cognition Arousal: Alert Behavior During Therapy: WFL for tasks assessed/performed   PT - Cognitive impairments: No apparent impairments                         Following commands: Intact      Cueing Cueing Techniques: Verbal cues  Exercises  Total Joint Exercises Ankle Circles/Pumps: AROM, Both, 15 reps, Seated Quad Sets: AROM, Left, 10 reps, Seated Heel Slides: AAROM, Strengthening, Left, 10 reps, Seated Hip ABduction/ADduction: AAROM, Strengthening, Left, 10 reps, Seated Long Arc Quad: AROM, Strengthening, Right, 10 reps, Seated    General Comments General comments (skin integrity, edema, etc.): Ortho dr came in and discussed the knee pain and reinforced the pain she is feeling is most likely coming from the hip due to the severity of the fxs. The knee coponents look great and no joint edema noted. She encouraged the knee sleeve use if the patient felt that it helped. Continue to work with therapy and encourage WBAT with gait.      Pertinent Vitals/Pain Pain Assessment Pain Score: 7  Pain Location: Knee and hip Pain Descriptors / Indicators: Aching, Discomfort, Guarding Pain Intervention(s): Limited activity within patient's tolerance, Monitored during session, Repositioned    Home Living                          Prior Function            PT Goals (current goals can now be found in the care plan section) Progress towards PT goals: Progressing toward goals    Frequency           PT Plan      Co-evaluation              AM-PAC PT "6 Clicks" Mobility   Outcome Measure  Help needed turning from your back to your side while in a flat bed without using bedrails?: A Little Help needed moving from lying on your back to sitting on the side of a flat bed without using bedrails?: A Little Help needed moving to and from a bed to a chair (including a wheelchair)?: A Little Help needed standing up from a chair using your arms (e.g., wheelchair or bedside chair)?: A Little Help needed to walk in hospital room?: A Lot Help needed climbing 3-5 steps with a railing? : Total 6 Click Score: 15    End of Session Equipment Utilized During Treatment: Gait belt Activity Tolerance: Patient limited by  fatigue;Treatment limited secondary to medical complications (Comment) Patient left: in chair;with call bell/phone within reach Nurse Communication: Mobility status PT Visit Diagnosis: Other abnormalities of gait and mobility (R26.89);Pain Pain - Right/Left: Left Pain - part of body: Hip;Knee     Time: 1123-1201 PT Time Calculation (min) (ACUTE ONLY): 38 min  Charges:    $Gait Training: 23-37 mins $Therapeutic Exercise: 8-22 mins PT General Charges $$ ACUTE PT VISIT: 1 Visit                      Greggory Stallion 12/26/2023, 12:08 PM

## 2023-12-26 NOTE — Progress Notes (Signed)
    Subjective:  Patient reports pain as mild to moderate.  Denies N/V/CP/SOB/Abd pain. She had a BM this morning.  Patient working with PT today.  She reports that her knee is slightly better feeling today. She is reluctant to put pressure through LE. Lengthy discussion about x-rays and referred knee pain from her hip. We reviewed her x-rays together.     Objective:   VITALS:   Vitals:   12/25/23 2158 12/26/23 0545 12/26/23 0835 12/26/23 1126  BP: 139/63 (!) 142/65 133/60 (!) 158/64  Pulse: 85 86  86  Resp: 16 15  18   Temp: 98 F (36.7 C) 97.7 F (36.5 C)  98.4 F (36.9 C)  TempSrc: Oral Oral  Oral  SpO2: 94% 95%  97%  Weight:      Height:        NAD Neurologically intact ABD soft Neurovascular intact Sensation intact distally Intact pulses distally Dorsiflexion/Plantar flexion intact No cellulitis present Compartment soft Aquacel dressings C/D/I x3.  Examination of her left knee reveals healed anterior incision. No pain with palpation over the joint lines, or periretinacular tissues. She can perform passive ROM without  pain. No pain with patellar movement. No warmth, redness, erythema or swelling noted. No extensor lag. No instability noted.    Lab Results  Component Value Date   WBC 11.4 (H) 12/26/2023   HGB 7.9 (L) 12/26/2023   HCT 26.7 (L) 12/26/2023   MCV 98.5 12/26/2023   PLT 558 (H) 12/26/2023   BMET    Component Value Date/Time   NA 134 (L) 12/26/2023 0336   K 4.3 12/26/2023 0336   CL 101 12/26/2023 0336   CO2 22 12/26/2023 0336   GLUCOSE 167 (H) 12/26/2023 0336   BUN 46 (H) 12/26/2023 0336   CREATININE 1.49 (H) 12/26/2023 0336   CALCIUM 8.7 (L) 12/26/2023 0336   GFRNONAA 36 (L) 12/26/2023 0336     Assessment/Plan: 12 Days Post-Op   Principal Problem:   Closed left hip fracture (HCC) Active Problems:   Obese   Essential hypertension   Diabetes mellitus (HCC)   History of TIA (transient ischemic attack)   ABLA (acute blood loss  anemia)   Acute urinary retention   WBAT with walker DVT ppx: Aspirin, SCDs, TEDS PO pain control PT/OT: Continue PT.  Dispo:  - Patient under care of the medical team, disposition per their recommendation. D/c to SNF. Pain medication printed in chart. Aspirin 81mg  BID x 6 weeks for DVT ppx.  - Lengthy discussion about x-rays and referred knee pain from her hip. We reviewed her x-rays together. We discussed healing process and pain that can be severe until the fracture is healed for 6-12 weeks. Encouraged weightbearing with PT to tolerance. Knee sleeve is only for comfort. Can remove at any time.   Clois Dupes, PA-C 12/26/2023, 11:33 AM   [facility]  [place] [address], [place], [place] [address] Phone: [phone] [email or web address] Facebook  [employer]

## 2023-12-26 NOTE — Discharge Summary (Signed)
 Physician Discharge Summary   Patient:  Alexis Hopkins MRN: [id number] DOB: 09-28-1946  Admit date:     12/13/2023  Discharge date: 12/26/23  Discharge Physician: Alexis Hopkins   PCP: Alexis Raider, MD   Recommendations at discharge:   Closed left intertrochanteric femur fracture Per orthopedics: WBAT with walker DVT ppx: Aspirin, SCDs, TEDS PO pain control PT/OT: continue PT/OT.  Dispo:  - Patient under care of the medical team, disposition per their recommendation. D/c to SNF. Pain medication printed in chart. Aspirin 81mg  BID x 6 weeks for DVT ppx.      AKI with peak creatinine 1.79.  CKD Stage IIIa  Baseline around 1.0.  Repeat today 1.49, patient aware elevated.  I think she will do well by drinking more water.  Recommend repeating check in 1 week.  Voiding well.   Discharge Diagnoses: Principal Problem:   Closed left hip fracture (HCC) Active Problems:   Diabetes mellitus (HCC)   ABLA (acute blood loss anemia)   Acute urinary retention   Obese   Essential hypertension   History of TIA (transient ischemic attack)  Resolved Problems:   * No resolved hospital problems. *  Hospital Course: 78 year old woman presented to [facility] after mechanical fall at home, transferred to [facility] for management of left intertrochanteric femur fracture which was accomplished expeditiously.  She had postoperative anemia treated with PRBC transfusion.  Hospitalization prolonged for debility and urinary retention.  Now clinically stable for SNF.  Consultants Orthopedics   Procedures/Events 3/14 Intramedullary fixation, Left femur.  3/15 one unit PRBC 3/16 one unit PRBC  Closed left intertrochanteric femur fracture Status post mechanical fall.  Underwent IM nail fixation by Alexis Hopkins 3/14 Feeling better today was able to take a few steps.  Ready for SNF. Per orthopedics: WBAT with walker DVT ppx: Aspirin, SCDs, TEDS PO pain control PT/OT: continue PT/OT.  Dispo:   - Patient under care of the medical team, disposition per their recommendation. D/c to SNF. Pain medication printed in chart. Aspirin 81mg  BID x 6 weeks for DVT ppx.    Acute blood loss anemia following surgery Stable.  Received total 2 unit pRBC transfusion Continue baby aspirin for TIA and DVT ppx    Acute urinary retention, resolved Failed voiding trial 3/16 and Foley catheter was replaced Removed foley 3/23. Now voiding well    Diabetes mellitus CBG stable.     History of TIA On aspirin, Plavix, pravastatin PTA Dr. Alvino Hopkins discussed with patient.  Patient had TIA decades ago and has been taking DAPT for many years.  They discussed stopping plavix at this point and continuing baby aspirin only   AKI with peak creatinine 1.79.  CKD Stage IIIa  Baseline around 1.0.  Repeat today 1.49, patient aware.  I think she will do well by drinking more water.  Recommend repeating check in 1 week.  Voiding well.   Hypertension   Obesity unspecified Body mass index is 32.01 kg/m. Glucerna Shake po BID, each supplement provides 220 kcal and 10 grams of protein   Multivitamin with minerals daily B complex w/ Vitamin C daily      Disposition: Skilled nursing facility Diet recommendation:  Regular diet DISCHARGE MEDICATION: Allergies as of 12/26/2023       Reactions   Motrin [ibuprofen] Other (See Comments)   Per MD-- does not want her taking.    Shellfish Allergy Hives   Shrimp.        Medication List     STOP taking  these medications    aspirin 325 MG tablet Replaced by: aspirin 81 MG chewable tablet   clopidogrel 75 MG tablet Commonly known as: PLAVIX   cycloSPORINE 0.05 % ophthalmic emulsion Commonly known as: RESTASIS       TAKE these medications    amLODipine 10 MG tablet Commonly known as: NORVASC Take 10 mg by mouth every morning.   amoxicillin 500 MG capsule Commonly known as: AMOXIL Take 2,000 mg by mouth See admin instructions. Take 4 capsules (2000  mg) by mouth 1 hour prior to dental appointments   aspirin 81 MG chewable tablet Commonly known as: Aspirin Childrens Chew 1 tablet (81 mg total) by mouth 2 (two) times daily with a meal. Replaces: aspirin 325 MG tablet   b complex vitamins capsule Take 1 capsule by mouth daily.   calcium-vitamin D 500-200 MG-UNIT tablet Commonly known as: OSCAL WITH D Take 1 tablet by mouth every morning.   colesevelam 625 MG tablet Commonly known as: WELCHOL Take 1,250 mg by mouth 2 (two) times daily with a meal.   ferrous sulfate 325 (65 FE) MG tablet Take 1 tablet (325 mg total) by mouth daily with breakfast. Start taking on: December 27, 2023   glimepiride 2 MG tablet Commonly known as: AMARYL Take 2 mg by mouth See admin instructions. Take 1/2 tablet twice daily.   hydrochlorothiazide 25 MG tablet Commonly known as: HYDRODIURIL Take 12.5 mg by mouth every morning.   lisinopril 40 MG tablet Commonly known as: ZESTRIL Take 40 mg by mouth every morning.   metFORMIN 1000 MG tablet Commonly known as: GLUCOPHAGE Take 1,000 mg by mouth 2 (two) times daily with a meal.   methocarbamol 500 MG tablet Commonly known as: ROBAXIN Take 1 tablet (500 mg total) by mouth 3 (three) times daily as needed for muscle spasms. What changed: See the new instructions.   multivitamin with minerals Tabs tablet Take 1 tablet by mouth every morning.   OSTEO BI-FLEX TRIPLE STRENGTH PO Take 1 tablet by mouth 2 (two) times daily.   OVER THE COUNTER MEDICATION Place 1 application into both eyes at bedtime. Pure and gentle lubricant eye pad   pravastatin 40 MG tablet Commonly known as: PRAVACHOL Take 40 mg by mouth daily.   Refresh 1.4-0.6 % Soln Generic drug: Polyvinyl Alcohol-Povidone PF Place 1 drop into both eyes daily as needed (Dry eye).   sodium chloride 0.65 % Soln nasal spray Commonly known as: OCEAN Place 1-2 sprays into both nostrils daily as needed (Dry nose).       ASK your doctor  about these medications    HYDROcodone-acetaminophen 5-325 MG tablet Commonly known as: NORCO/VICODIN Take 1 tablet by mouth every 4 (four) hours as needed for up to 7 days for moderate pain (pain score 4-6) or severe pain (pain score 7-10). Ask about: Should I take this medication?        Contact information for follow-up providers     [place], Alain Honey, PA-C. Schedule an appointment as soon as possible for a visit in 2 week(s).   Specialty: Orthopedic Surgery Why: For suture removal, For wound re-check Contact information: [address], [address] [place] [address] [phone]              Contact information for after-discharge care     Destination     [facility] Preferred SNF .   Service: Skilled Nursing Contact information: [address] [place] [address] [phone]  Feels better, took a few steps Voiding well  Discharge Exam: Filed Weights   12/13/23 1502 12/14/23 1228  Weight: 79.4 kg 79.4 kg   Physical Exam Vitals reviewed.  Constitutional:      General: She is not in acute distress.    Appearance: She is not ill-appearing or toxic-appearing.  Cardiovascular:     Rate and Rhythm: Normal rate and regular rhythm.     Heart sounds: No murmur heard. Pulmonary:     Effort: Pulmonary effort is normal. No respiratory distress.     Breath sounds: No wheezing, rhonchi or rales.  Neurological:     Mental Status: She is alert.  Psychiatric:        Mood and Affect: Mood normal.        Behavior: Behavior normal.      Condition at discharge: good  The results of significant diagnostics from this hospitalization (including imaging, microbiology, ancillary and laboratory) are listed below for reference.   Imaging Studies: DG Knee Complete 4 Views Left Result Date: 12/24/2023 CLINICAL DATA:  Knee pain EXAM: LEFT KNEE - COMPLETE 4+ VIEW COMPARISON:  12/13/2023  FINDINGS: Prior left total knee arthroplasty. No periprosthetic lucency or fracture. No knee joint effusion. New intramedullary rod and distal interlocking screw within the femur. No focal soft tissue swelling about the knee. IMPRESSION: Prior left total knee arthroplasty without evidence of hardware complication. Electronically Signed   By: Duanne Guess D.O.   On: 12/24/2023 13:48   DG HIP UNILAT WITH PELVIS 1V LEFT Result Date: 12/24/2023 CLINICAL DATA:  [id number] Knee pain 125018 Leg pain. EXAM: DG HIP (WITH OR WITHOUT PELVIS) 1V*L* COMPARISON:  Left femur radiographs 12/14/2023. FINDINGS: AP and cross-table lateral views of the proximal left femur. The distal left femur is imaged on the knee radiographs, dictated separately. Intact hardware status post left femoral intramedullary nail and dynamic screw fixation of a comminuted subtrochanteric femur fracture. The visualized hardware is intact without loosening. The alignment of the fracture is stable. Lateral skin staples are in place without suspicious soft tissue findings. IMPRESSION: Stable alignment of comminuted subtrochanteric left femur fracture status post ORIF. No evidence of hardware complication. See separate examination of the left knee. Electronically Signed   By: Carey Bullocks M.D.   On: 12/24/2023 13:48   DG FEMUR PORT MIN 2 VIEWS LEFT Result Date: 12/14/2023 CLINICAL DATA:  [id number] Closed comminuted intertrochanteric fracture of left femur, initial encounter [facility]) [id number] EXAM: LEFT FEMUR PORTABLE 2 VIEWS COMPARISON:  Preoperative imaging FINDINGS: Femoral intramedullary nail with trans trochanteric and distal locking screw fixation traverse proximal femur fracture. Improved fracture alignment from preoperative imaging. Recent postsurgical change includes air and edema in the soft tissues with overlying skin staples in place. IMPRESSION: ORIF of proximal femur fracture with improved alignment. Electronically Signed   By: Narda Rutherford  M.D.   On: 12/14/2023 18:18   DG FEMUR MIN 2 VIEWS LEFT Result Date: 12/14/2023 CLINICAL DATA:  Elective surgery, femur IM nail. EXAM: LEFT FEMUR 2 VIEWS COMPARISON:  None Available. FINDINGS: Thirteen fluoroscopic spot views of the left femur submitted from the operating room. Femoral intramedullary nail with trans trochanteric and distal locking screw fixation traverse comminuted proximal femur fracture. Previous knee arthroplasty. Fluoroscopy time 56 seconds. Dose 6.3447 mGy IMPRESSION: Intraoperative fluoroscopy during proximal femur fracture fixation. Electronically Signed   By: Narda Rutherford M.D.   On: 12/14/2023 15:27   DG C-Arm 1-60 Min-No Report Result Date: 12/14/2023 Fluoroscopy was utilized by the requesting physician.  No radiographic interpretation.   DG C-Arm 1-60 Min-No Report Result Date: 12/14/2023 Fluoroscopy was utilized by the requesting physician.  No radiographic interpretation.   DG Chest 1 View Result Date: 12/13/2023 CLINICAL DATA:  Trip and fall.  Left hip fracture. EXAM: CHEST  1 VIEW COMPARISON:  09/18/2014 FINDINGS: Lung volumes are low.The cardiomediastinal contours are normal. The lungs are clear. Pulmonary vasculature is normal. No consolidation, pleural effusion, or pneumothorax. Bilateral for shoulder arthroplasties. Remote right rib fractures. IMPRESSION: Low lung volumes without acute chest finding. Electronically Signed   By: Narda Rutherford M.D.   On: 12/13/2023 18:21   DG Hip Unilat W or Wo Pelvis 2-3 Views Left Result Date: 12/13/2023 CLINICAL DATA:  Pain.  Fall. EXAM: DG HIP (WITH OR WITHOUT PELVIS) 2-3V LEFT COMPARISON:  None Available. FINDINGS: Comminuted proximal femur fracture. There is involvement of the subtrochanteric femur is well as lesser trochanter. Mild apex lateral angulation. Femoral head remains seated, no hip dislocation. No additional fracture of the pelvis. Pubic rami are intact. No pubic symphyseal or sacroiliac diastasis. IMPRESSION:  Comminuted proximal left femur fracture with involvement of the subtrochanteric femur and lesser trochanter. Electronically Signed   By: Narda Rutherford M.D.   On: 12/13/2023 18:21   DG Knee Left Port Result Date: 12/13/2023 CLINICAL DATA:  Pain after fall EXAM: PORTABLE LEFT KNEE - 2 VIEW COMPARISON:  X-ray 03/26/2005. FINDINGS: Total knee arthroplasty seen. Cemented femoral tibial component. Patellar button. No dislocation. On lateral view there is a cortical deformity along the proximal fibular shaft. No evidence of hardware failure. Osteopenia. No joint effusion on lateral view IMPRESSION: Total knee arthroplasty. Subtle deformity on the lateral view at the proximal fibular shaft. Please correlate to point tenderness. If needed additional workup with a x-ray of the tibia and fibula. Electronically Signed   By: Karen Kays M.D.   On: 12/13/2023 17:55    Microbiology: Results for orders placed or performed during the hospital encounter of 12/13/23  Surgical PCR screen     Status: None   Collection Time: 12/13/23  9:59 PM   Specimen: Nasal Mucosa; Nasal Swab  Result Value Ref Range Status   MRSA, PCR NEGATIVE NEGATIVE Final   Staphylococcus aureus NEGATIVE NEGATIVE Final    Comment: (NOTE) The Xpert SA Assay (FDA approved for NASAL specimens in patients 71 years of age and older), is one component of a comprehensive surveillance program. It is not intended to diagnose infection nor to guide or monitor treatment. Performed at [facility], [address] [place], [place] [address]     Labs: CBC: Recent Labs  Lab 12/22/23 [phone] 12/23/23 0319 12/24/23 0327 12/25/23 0331 12/26/23 0336  WBC 12.5* 11.3* 12.2* 12.7* 11.4*  HGB 7.8* 7.5* 8.0* 7.7* 7.9*  HCT 24.6* 24.0* 25.7* 24.8* 26.7*  MCV 91.8 93.4 94.1 93.6 98.5  PLT 383 419* 517* 522* 558*   Basic Metabolic Panel: Recent Labs  Lab 12/26/23 0336  NA 134*  K 4.3  CL 101  CO2 22  GLUCOSE 167*  BUN 46*   CREATININE 1.49*  CALCIUM 8.7*   Liver Function Tests: No results for input(s): "AST", "ALT", "ALKPHOS", "BILITOT", "PROT", "ALBUMIN" in the last 168 hours. CBG: Recent Labs  Lab 12/25/23 1133 12/25/23 1710 12/25/23 2159 12/26/23 0805 12/26/23 1124  GLUCAP 258* 140* 201* 188* 257*    Discharge time spent: greater than 30 minutes.  Signed: Brendia Sacks, MD [facility] 12/26/2023

## 2023-12-26 NOTE — TOC Transition Note (Signed)
 Transition of Care [facility]) - Discharge Note   Patient Details  Name: Alexis Hopkins MRN: [id number] Date of Birth: 1946-04-16  [facility]) CM/SW Contact:  Amada Jupiter, LCSW Phone Number: 12/26/2023, 1:06 PM   Clinical Narrative:     Pt medically cleared for dc to [facility] [employer] SNF in [place] today.  Pt and daughter aware and agreeable.  Have insurance authorization.  PTAR called at 1:00pm.  RN to call report to [phone].  No further TOC needs.  Final next level of care: Skilled Nursing Facility Barriers to Discharge: Barriers Resolved   Patient Goals and CMS Choice Patient states their goals for this hospitalization and ongoing recovery are:: For pt to get rehab          Discharge Placement PASRR number recieved: 12/17/23            Patient [facility] bed at: [facility] Patient to be transferred to facility by: PTAR Name of family member notified: daughter, Victorino Dike Patient and family notified of of transfer: 12/26/23  Discharge Plan and Services Additional resources added to the After Visit Summary for   In-house Referral: Clinical Social Work Discharge Planning Services: NA Post Acute Care Choice: Home Health, Skilled Nursing Facility, Durable Medical Equipment          DME Arranged: N/A DME Agency: NA                  Social Drivers of Health (SDOH) Interventions SDOH Screenings   Food Insecurity: No Food Insecurity (12/13/2023)  Housing: Low Risk  (12/13/2023)  Transportation Needs: No Transportation Needs (12/13/2023)  Utilities: Not At Risk (12/13/2023)  Social Connections: Unknown (12/13/2023)  Tobacco Use: Low Risk  (12/14/2023)     Readmission Risk Interventions    12/26/2023    1:05 PM 12/14/2023   10:13 AM  Readmission Risk Prevention Plan  Post Dischage Appt  Complete  Medication Screening  Complete  Transportation Screening Complete Complete  PCP or Specialist Appt within 5-7 Days Complete   Home Care  Screening Complete   Medication Review (RN CM) Complete

## 2023-12-27 DIAGNOSIS — E119 Type 2 diabetes mellitus without complications: Secondary | ICD-10-CM | POA: Diagnosis not present

## 2023-12-27 DIAGNOSIS — R5381 Other malaise: Secondary | ICD-10-CM | POA: Diagnosis not present

## 2023-12-27 DIAGNOSIS — N189 Chronic kidney disease, unspecified: Secondary | ICD-10-CM | POA: Diagnosis not present

## 2023-12-27 DIAGNOSIS — I10 Essential (primary) hypertension: Secondary | ICD-10-CM | POA: Diagnosis not present

## 2023-12-31 DIAGNOSIS — D62 Acute posthemorrhagic anemia: Secondary | ICD-10-CM | POA: Diagnosis not present

## 2023-12-31 DIAGNOSIS — I499 Cardiac arrhythmia, unspecified: Secondary | ICD-10-CM | POA: Diagnosis not present

## 2023-12-31 DIAGNOSIS — R5381 Other malaise: Secondary | ICD-10-CM | POA: Diagnosis not present

## 2023-12-31 DIAGNOSIS — E119 Type 2 diabetes mellitus without complications: Secondary | ICD-10-CM | POA: Diagnosis not present

## 2023-12-31 DIAGNOSIS — R339 Retention of urine, unspecified: Secondary | ICD-10-CM | POA: Diagnosis not present

## 2023-12-31 DIAGNOSIS — I10 Essential (primary) hypertension: Secondary | ICD-10-CM | POA: Diagnosis not present

## 2023-12-31 DIAGNOSIS — N189 Chronic kidney disease, unspecified: Secondary | ICD-10-CM | POA: Diagnosis not present

## 2023-12-31 DIAGNOSIS — N179 Acute kidney failure, unspecified: Secondary | ICD-10-CM | POA: Diagnosis not present

## 2024-01-04 DIAGNOSIS — S72142D Displaced intertrochanteric fracture of left femur, subsequent encounter for closed fracture with routine healing: Secondary | ICD-10-CM | POA: Diagnosis not present

## 2024-01-07 DIAGNOSIS — I499 Cardiac arrhythmia, unspecified: Secondary | ICD-10-CM | POA: Diagnosis not present

## 2024-01-07 DIAGNOSIS — S72142D Displaced intertrochanteric fracture of left femur, subsequent encounter for closed fracture with routine healing: Secondary | ICD-10-CM | POA: Diagnosis not present

## 2024-01-07 DIAGNOSIS — N179 Acute kidney failure, unspecified: Secondary | ICD-10-CM | POA: Diagnosis not present

## 2024-01-07 DIAGNOSIS — R5381 Other malaise: Secondary | ICD-10-CM | POA: Diagnosis not present

## 2024-01-07 DIAGNOSIS — R339 Retention of urine, unspecified: Secondary | ICD-10-CM | POA: Diagnosis not present

## 2024-01-07 DIAGNOSIS — R600 Localized edema: Secondary | ICD-10-CM | POA: Diagnosis not present

## 2024-01-07 DIAGNOSIS — E119 Type 2 diabetes mellitus without complications: Secondary | ICD-10-CM | POA: Diagnosis not present

## 2024-01-07 DIAGNOSIS — I10 Essential (primary) hypertension: Secondary | ICD-10-CM | POA: Diagnosis not present

## 2024-01-07 DIAGNOSIS — D62 Acute posthemorrhagic anemia: Secondary | ICD-10-CM | POA: Diagnosis not present

## 2024-01-08 DIAGNOSIS — R601 Generalized edema: Secondary | ICD-10-CM | POA: Diagnosis not present

## 2024-01-14 DIAGNOSIS — R600 Localized edema: Secondary | ICD-10-CM | POA: Diagnosis not present

## 2024-01-14 DIAGNOSIS — K5900 Constipation, unspecified: Secondary | ICD-10-CM | POA: Diagnosis not present

## 2024-01-14 DIAGNOSIS — I10 Essential (primary) hypertension: Secondary | ICD-10-CM | POA: Diagnosis not present

## 2024-01-14 DIAGNOSIS — N189 Chronic kidney disease, unspecified: Secondary | ICD-10-CM | POA: Diagnosis not present

## 2024-01-14 DIAGNOSIS — E119 Type 2 diabetes mellitus without complications: Secondary | ICD-10-CM | POA: Diagnosis not present

## 2024-01-14 DIAGNOSIS — N179 Acute kidney failure, unspecified: Secondary | ICD-10-CM | POA: Diagnosis not present

## 2024-01-14 DIAGNOSIS — R339 Retention of urine, unspecified: Secondary | ICD-10-CM | POA: Diagnosis not present

## 2024-01-14 DIAGNOSIS — R5381 Other malaise: Secondary | ICD-10-CM | POA: Diagnosis not present

## 2024-01-14 DIAGNOSIS — D62 Acute posthemorrhagic anemia: Secondary | ICD-10-CM | POA: Diagnosis not present

## 2024-01-16 DIAGNOSIS — R110 Nausea: Secondary | ICD-10-CM | POA: Diagnosis not present

## 2024-01-16 DIAGNOSIS — R5381 Other malaise: Secondary | ICD-10-CM | POA: Diagnosis not present

## 2024-01-16 DIAGNOSIS — R339 Retention of urine, unspecified: Secondary | ICD-10-CM | POA: Diagnosis not present

## 2024-01-16 DIAGNOSIS — S72142D Displaced intertrochanteric fracture of left femur, subsequent encounter for closed fracture with routine healing: Secondary | ICD-10-CM | POA: Diagnosis not present

## 2024-01-16 DIAGNOSIS — N189 Chronic kidney disease, unspecified: Secondary | ICD-10-CM | POA: Diagnosis not present

## 2024-01-16 DIAGNOSIS — N179 Acute kidney failure, unspecified: Secondary | ICD-10-CM | POA: Diagnosis not present

## 2024-01-16 DIAGNOSIS — E119 Type 2 diabetes mellitus without complications: Secondary | ICD-10-CM | POA: Diagnosis not present

## 2024-01-16 DIAGNOSIS — I10 Essential (primary) hypertension: Secondary | ICD-10-CM | POA: Diagnosis not present

## 2024-01-16 DIAGNOSIS — K5900 Constipation, unspecified: Secondary | ICD-10-CM | POA: Diagnosis not present

## 2024-01-16 DIAGNOSIS — R600 Localized edema: Secondary | ICD-10-CM | POA: Diagnosis not present

## 2024-01-16 DIAGNOSIS — K219 Gastro-esophageal reflux disease without esophagitis: Secondary | ICD-10-CM | POA: Diagnosis not present

## 2024-01-16 DIAGNOSIS — D62 Acute posthemorrhagic anemia: Secondary | ICD-10-CM | POA: Diagnosis not present

## 2024-01-17 ENCOUNTER — Other Ambulatory Visit: Payer: Self-pay

## 2024-01-17 ENCOUNTER — Inpatient Hospital Stay (HOSPITAL_COMMUNITY)
Admission: EM | Admit: 2024-01-17 | Discharge: 2024-01-21 | DRG: 368 | Disposition: A | Discharge status: Skilled Nursing Facility | Source: Skilled Nursing Facility | Attending: Student | Admitting: Student

## 2024-01-17 DIAGNOSIS — D631 Anemia in chronic kidney disease: Secondary | ICD-10-CM | POA: Diagnosis present

## 2024-01-17 DIAGNOSIS — I129 Hypertensive chronic kidney disease with stage 1 through stage 4 chronic kidney disease, or unspecified chronic kidney disease: Secondary | ICD-10-CM | POA: Diagnosis not present

## 2024-01-17 DIAGNOSIS — K449 Diaphragmatic hernia without obstruction or gangrene: Secondary | ICD-10-CM | POA: Diagnosis present

## 2024-01-17 DIAGNOSIS — Z96653 Presence of artificial knee joint, bilateral: Secondary | ICD-10-CM | POA: Diagnosis not present

## 2024-01-17 DIAGNOSIS — G90512 Complex regional pain syndrome I of left upper limb: Secondary | ICD-10-CM | POA: Diagnosis present

## 2024-01-17 DIAGNOSIS — D649 Anemia, unspecified: Secondary | ICD-10-CM | POA: Diagnosis present

## 2024-01-17 DIAGNOSIS — Z8781 Personal history of (healed) traumatic fracture: Secondary | ICD-10-CM | POA: Diagnosis not present

## 2024-01-17 DIAGNOSIS — E782 Mixed hyperlipidemia: Secondary | ICD-10-CM | POA: Diagnosis not present

## 2024-01-17 DIAGNOSIS — D72825 Bandemia: Secondary | ICD-10-CM | POA: Diagnosis not present

## 2024-01-17 DIAGNOSIS — I10 Essential (primary) hypertension: Secondary | ICD-10-CM | POA: Diagnosis not present

## 2024-01-17 DIAGNOSIS — K219 Gastro-esophageal reflux disease without esophagitis: Secondary | ICD-10-CM | POA: Diagnosis present

## 2024-01-17 DIAGNOSIS — K2100 Gastro-esophageal reflux disease with esophagitis, without bleeding: Secondary | ICD-10-CM | POA: Diagnosis not present

## 2024-01-17 DIAGNOSIS — K921 Melena: Secondary | ICD-10-CM | POA: Diagnosis not present

## 2024-01-17 DIAGNOSIS — Z7984 Long term (current) use of oral hypoglycemic drugs: Secondary | ICD-10-CM

## 2024-01-17 DIAGNOSIS — E875 Hyperkalemia: Secondary | ICD-10-CM | POA: Diagnosis not present

## 2024-01-17 DIAGNOSIS — N1832 Chronic kidney disease, stage 3b: Secondary | ICD-10-CM | POA: Diagnosis not present

## 2024-01-17 DIAGNOSIS — D62 Acute posthemorrhagic anemia: Secondary | ICD-10-CM | POA: Diagnosis not present

## 2024-01-17 DIAGNOSIS — E1122 Type 2 diabetes mellitus with diabetic chronic kidney disease: Secondary | ICD-10-CM | POA: Diagnosis present

## 2024-01-17 DIAGNOSIS — R531 Weakness: Secondary | ICD-10-CM | POA: Diagnosis not present

## 2024-01-17 DIAGNOSIS — K254 Chronic or unspecified gastric ulcer with hemorrhage: Secondary | ICD-10-CM | POA: Diagnosis present

## 2024-01-17 DIAGNOSIS — K259 Gastric ulcer, unspecified as acute or chronic, without hemorrhage or perforation: Secondary | ICD-10-CM | POA: Diagnosis not present

## 2024-01-17 DIAGNOSIS — Z85828 Personal history of other malignant neoplasm of skin: Secondary | ICD-10-CM

## 2024-01-17 DIAGNOSIS — K922 Gastrointestinal hemorrhage, unspecified: Secondary | ICD-10-CM | POA: Diagnosis present

## 2024-01-17 DIAGNOSIS — K2101 Gastro-esophageal reflux disease with esophagitis, with bleeding: Principal | ICD-10-CM | POA: Diagnosis present

## 2024-01-17 DIAGNOSIS — Z8673 Personal history of transient ischemic attack (TIA), and cerebral infarction without residual deficits: Secondary | ICD-10-CM

## 2024-01-17 DIAGNOSIS — Z79899 Other long term (current) drug therapy: Secondary | ICD-10-CM

## 2024-01-17 DIAGNOSIS — D72829 Elevated white blood cell count, unspecified: Secondary | ICD-10-CM | POA: Diagnosis present

## 2024-01-17 DIAGNOSIS — Z7982 Long term (current) use of aspirin: Secondary | ICD-10-CM

## 2024-01-17 DIAGNOSIS — Z96642 Presence of left artificial hip joint: Secondary | ICD-10-CM | POA: Diagnosis not present

## 2024-01-17 DIAGNOSIS — M810 Age-related osteoporosis without current pathological fracture: Secondary | ICD-10-CM | POA: Diagnosis present

## 2024-01-17 DIAGNOSIS — D539 Nutritional anemia, unspecified: Secondary | ICD-10-CM | POA: Diagnosis present

## 2024-01-17 DIAGNOSIS — E119 Type 2 diabetes mellitus without complications: Secondary | ICD-10-CM | POA: Diagnosis not present

## 2024-01-17 DIAGNOSIS — E66811 Obesity, class 1: Secondary | ICD-10-CM | POA: Diagnosis present

## 2024-01-17 DIAGNOSIS — Z96611 Presence of right artificial shoulder joint: Secondary | ICD-10-CM | POA: Diagnosis present

## 2024-01-17 DIAGNOSIS — Z96612 Presence of left artificial shoulder joint: Secondary | ICD-10-CM | POA: Diagnosis present

## 2024-01-17 DIAGNOSIS — Z91013 Allergy to seafood: Secondary | ICD-10-CM

## 2024-01-17 DIAGNOSIS — Z886 Allergy status to analgesic agent status: Secondary | ICD-10-CM

## 2024-01-17 DIAGNOSIS — E785 Hyperlipidemia, unspecified: Secondary | ICD-10-CM | POA: Diagnosis not present

## 2024-01-17 DIAGNOSIS — Z6830 Body mass index (BMI) 30.0-30.9, adult: Secondary | ICD-10-CM

## 2024-01-17 DIAGNOSIS — Z7902 Long term (current) use of antithrombotics/antiplatelets: Secondary | ICD-10-CM

## 2024-01-17 DIAGNOSIS — R8271 Bacteriuria: Secondary | ICD-10-CM | POA: Diagnosis present

## 2024-01-17 LAB — COMPREHENSIVE METABOLIC PANEL WITH GFR
ALT: 16 U/L (ref 0–44)
AST: 20 U/L (ref 15–41)
Albumin: 3.2 g/dL — ABNORMAL LOW (ref 3.5–5.0)
Alkaline Phosphatase: 75 U/L (ref 38–126)
Anion gap: 12 (ref 5–15)
BUN: 79 mg/dL — ABNORMAL HIGH (ref 8–23)
CO2: 16 mmol/L — ABNORMAL LOW (ref 22–32)
Calcium: 9.5 mg/dL (ref 8.9–10.3)
Chloride: 106 mmol/L (ref 98–111)
Creatinine, Ser: 1.65 mg/dL — ABNORMAL HIGH (ref 0.44–1.00)
GFR, Estimated: 32 mL/min — ABNORMAL LOW (ref >60)
Glucose, Bld: 133 mg/dL — ABNORMAL HIGH (ref 70–99)
Potassium: 5.2 mmol/L — ABNORMAL HIGH (ref 3.5–5.1)
Sodium: 134 mmol/L — ABNORMAL LOW (ref 135–145)
Total Bilirubin: 0.7 mg/dL (ref 0.0–1.2)
Total Protein: 5.9 g/dL — ABNORMAL LOW (ref 6.5–8.1)

## 2024-01-17 NOTE — ED Provider Notes (Signed)
 [facility] Provider Note   CSN: [id number] Arrival date & time: 01/17/24  2239     History  Chief Complaint  Patient presents with   Low Hgb 4.4    Alexis Hopkins is a 78 y.o. female, history of diabetes, hypertension, left total hip replacement, who presents to the ED secondary to feeling fatigued for the last 3 days and finding of a hemoglobin of 4.4, at her nursing facility.  She states she had a hip replacement done about 3 weeks ago, and has been recovering at SNF, and been doing well in therapy, until about 3 days ago, she pain suddenly fatigue, and started not wanting to walk. Denies any shortness of breath or chest pain.  She notes that she was having some constipation, and she states that he was given some laxatives, start having large volumes of black stool.  She states she was on iron , so she did not think much of it.  Only takes 2 baby aspirin , for DVT prophylaxis after her surgery, does not take any other antiplatelets, or anticoagulants.  Denies any abdominal pain.  Has not been using any steroids, or ibuprofen or other NSAIDs     Home Medications Prior to Admission medications   Medication Sig Start Date End Date Taking? Authorizing Provider  acetaminophen  (TYLENOL ) 500 MG tablet Take 1,000 mg by mouth in the morning, at noon, and at bedtime.   Yes [provider]  amLODipine  (NORVASC ) 10 MG tablet Take 10 mg by mouth every morning.    Yes [provider]  amoxicillin  (AMOXIL ) 500 MG capsule Take 2,000 mg by mouth See admin instructions. Take 4 capsules (2000 mg) by mouth 1 hour prior to dental appointments 05/02/21  Yes [provider]  aspirin  (ASPIRIN  CHILDRENS) 81 MG chewable tablet Chew 1 tablet (81 mg total) by mouth 2 (two) times daily with a meal. 12/14/23 01/28/24 Yes Hill, Philippa Bray, PA-C  calcium -vitamin D  (OSCAL WITH D) 500-200 MG-UNIT per tablet Take 1 tablet by mouth every morning.    Yes  [provider]  colesevelam  (WELCHOL ) 625 MG tablet Take 1,250 mg by mouth 2 (two) times daily with a meal.   Yes [provider]  docusate sodium  (COLACE) 100 MG capsule Take 200 mg by mouth daily.   Yes [provider]  famotidine (PEPCID) 20 MG tablet Take 20 mg by mouth 2 (two) times daily.   Yes [provider]  ferrous sulfate  325 (65 FE) MG tablet Take 1 tablet (325 mg total) by mouth daily with breakfast. 12/27/23  Yes Lonita Roach, MD  glimepiride  (AMARYL ) 2 MG tablet Take 1 mg by mouth 2 (two) times daily.   Yes [provider]  hydrochlorothiazide  (HYDRODIURIL ) 12.5 MG tablet Take 12.5 mg by mouth every morning.   Yes [provider]  HYDROcodone -acetaminophen  (NORCO/VICODIN) 5-325 MG tablet Take 1 tablet by mouth every 6 (six) hours as needed for moderate pain (pain score 4-6) or severe pain (pain score 7-10).   Yes [provider]  lisinopril  (PRINIVIL ,ZESTRIL ) 40 MG tablet Take 40 mg by mouth every morning.    Yes [provider]  metFORMIN  (GLUCOPHAGE ) 1000 MG tablet Take 1,000 mg by mouth 2 (two) times daily with a meal.   Yes [provider]  methocarbamol  (ROBAXIN ) 500 MG tablet Take 1 tablet (500 mg total) by mouth 3 (three) times daily as needed for muscle spasms. 12/26/23  Yes Lonita Roach, MD  Misc Natural Products (OSTEO BI-FLEX TRIPLE STRENGTH PO) Take 1 tablet by mouth 2 (two) times daily.   Yes [provider]  ondansetron  (ZOFRAN -ODT) 4 MG disintegrating tablet Take 4 mg by mouth every 6 (six) hours as needed for nausea.   Yes [provider]  OVER THE COUNTER MEDICATION Place 1 application into both eyes at bedtime. Pure and gentle lubricant eye pad   Yes [provider]  polyethylene glycol (MIRALAX  / GLYCOLAX ) 17 g packet Take 17 g by mouth every morning.   Yes [provider]  Polyvinyl Alcohol -Povidone PF (REFRESH) 1.4-0.6 % SOLN Place 1 drop  into both eyes daily as needed (for dry eye).   Yes [provider]  pravastatin  (PRAVACHOL ) 40 MG tablet Take 40 mg by mouth daily. 03/11/21  Yes [provider]  sodium chloride  (OCEAN) 0.65 % SOLN nasal spray Place 1-2 sprays into both nostrils daily as needed (Dry nose).   Yes [provider]  b complex vitamins capsule Take 1 capsule by mouth daily. Patient not taking: Reported on 01/18/2024    [provider]  Multiple Vitamin (MULTIVITAMIN WITH MINERALS) TABS tablet Take 1 tablet by mouth every morning.  Patient not taking: Reported on 01/18/2024    [provider]      Allergies    Motrin [ibuprofen] and Shellfish allergy    Review of Systems   Review of Systems  Constitutional:  Negative for fever.  Respiratory:  Negative for shortness of breath.   Gastrointestinal:  Positive for blood in stool.    Physical Exam Updated Vital Signs BP (!) 129/53   Pulse 84   Temp 97.9 F (36.6 C) (Oral)   Resp 20   SpO2 93%  Physical Exam Vitals and nursing note reviewed.  Constitutional:      General: She is not in acute distress.    Appearance: She is well-developed.  HENT:     Head: Normocephalic and atraumatic.  Eyes:     Conjunctiva/sclera: Conjunctivae normal.  Cardiovascular:     Rate and Rhythm: Normal rate and regular rhythm.     Heart sounds: No murmur heard. Pulmonary:     Effort: Pulmonary effort is normal. No respiratory distress.     Breath sounds: Normal breath sounds.  Abdominal:     Palpations: Abdomen is soft.     Tenderness: There is no abdominal tenderness.  Genitourinary:    Comments: Chaperone present, no hemorrhoids present, large amount of black stool on exam Musculoskeletal:        General: No swelling.     Cervical back: Neck supple.  Skin:    General: Skin is warm and dry.     Capillary Refill: Capillary refill takes less than 2 seconds.  Neurological:     Mental Status: She is alert.  Psychiatric:         Mood and Affect: Mood normal.     ED Results / Procedures / Treatments   Labs (all labs ordered are listed, but only abnormal results are displayed) Labs Reviewed  COMPREHENSIVE METABOLIC PANEL WITH GFR - Abnormal; Notable for the following components:      Result Value   Sodium 134 (*)    Potassium 5.2 (*)    CO2 16 (*)    Glucose, Bld 133 (*)    BUN 79 (*)    Creatinine, Ser 1.65 (*)    Total Protein 5.9 (*)    Albumin  3.2 (*)    GFR, Estimated 32 (*)  All other components within normal limits  CBC - Abnormal; Notable for the following components:   WBC 15.2 (*)    RBC 1.46 (*)    Hemoglobin 4.4 (*)    HCT 15.3 (*)    MCV 104.8 (*)    MCHC 28.8 (*)    RDW 22.2 (*)    All other components within normal limits  RETICULOCYTES - Abnormal; Notable for the following components:   Retic Ct Pct 15.1 (*)    RBC. 1.45 (*)    Retic Count, Absolute 218.8 (*)    Immature Retic Fract 48.9 (*)    All other components within normal limits  POC OCCULT BLOOD, ED - Abnormal; Notable for the following components:   Fecal Occult Bld POSITIVE (*)    All other components within normal limits  FOLATE  CBC WITH DIFFERENTIAL/PLATELET  COMPREHENSIVE METABOLIC PANEL WITH GFR  MAGNESIUM   HEMOGLOBIN AND HEMATOCRIT, BLOOD  HEMOGLOBIN AND HEMATOCRIT, BLOOD  FERRITIN  IRON  AND TIBC  PROTIME-INR  APTT  VITAMIN B12  URINALYSIS, COMPLETE (UACMP) WITH MICROSCOPIC  CBG MONITORING, ED  TYPE AND SCREEN  PREPARE RBC (CROSSMATCH)    EKG None  Radiology No results found.  Procedures Procedures    Medications Ordered in ED Medications  0.9 %  sodium chloride  infusion (Manually program via Guardrails IV Fluids) (has no administration in time range)  acetaminophen  (TYLENOL ) tablet 650 mg (has no administration in time range)    Or  acetaminophen  (TYLENOL ) suppository 650 mg (has no administration in time range)  ondansetron  (ZOFRAN ) injection 4 mg (has no administration in time  range)  pantoprazole  (PROTONIX ) injection 40 mg (has no administration in time range)  insulin  aspart (novoLOG ) injection 0-6 Units (has no administration in time range)  naloxone  (NARCAN ) injection 0.4 mg (has no administration in time range)  fentaNYL  (SUBLIMAZE ) injection 12.5 mcg (has no administration in time range)  pantoprazole  (PROTONIX ) injection 40 mg (40 mg Intravenous Given 01/18/24 0217)    ED Course/ Medical Decision Making/ A&P                                 Medical Decision Making Patient is a 78 year old, history of diabetes, here for black stool, weakness has been going on for last 3 days.  She states she is only on 2 baby aspirin  daily, has not been taking any NSAIDs.  Is overall well-appearing, denies any shortness of breath or chest pain, but states she feels weak.  Is not have any dizziness.  Denies any abdominal pain.  On exam she has melena.  Hemoccult positive.  Will repeat CBC, and start on pantoprazole   Amount and/or Complexity of Data Reviewed Labs: ordered.    Details: CBC shows leukocytosis, and hemoglobin of 4.4 Discussion of management or test interpretation with external provider(s): Hemoglobin of 4.4, she does have melena on exam with Hemoccult positive, and suspicious of a GI bleed, which is causing this, spoke to GI, the [facility] team, Dr. Feliberto Hopping, and she recommends 2 units of blood, transfusing, as patient's vitals are stable currently.  Will likely do endoscopy in AM.  Keep NPO.  Admitted to Dr. Brock Canner for further management.   Risk Prescription drug management. Decision regarding hospitalization.  CRITICAL CARE Performed by: Timmy Forbes   Total critical care time: 31 minutes  Critical care time was exclusive of separately billable procedures and treating other patients.  Critical care was necessary to treat  or prevent imminent or life-threatening deterioration.  Critical care was time spent personally by me on the following activities:  development of treatment plan with patient and/or surrogate as well as nursing, discussions with consultants, evaluation of patient's response to treatment, examination of patient, obtaining history from patient or surrogate, ordering and performing treatments and interventions, ordering and review of laboratory studies, ordering and review of radiographic studies, pulse oximetry and re-evaluation of patient's condition.   Final Clinical Impression(s) / ED Diagnoses Final diagnoses:  Gastrointestinal hemorrhage with melena    Rx / DC Orders ED Discharge Orders     None         Sabre Romberger, Dwaine Gip, PA 01/18/24 1610    Edson Graces, MD 01/18/24 [phone]

## 2024-01-17 NOTE — ED Triage Notes (Signed)
 Pt arrives via POV from [facility] where she lives follow rehab for left hip surgery accompanied by her daughter. Had labs drawn showing hgb 4.4. pt sts she has dark tarry stool but also takes iron supplements. BP 144/51 in triage.

## 2024-01-18 ENCOUNTER — Inpatient Hospital Stay (HOSPITAL_COMMUNITY): Admitting: Anesthesiology

## 2024-01-18 ENCOUNTER — Encounter (HOSPITAL_COMMUNITY)
Admission: EM | Disposition: A | Discharge status: Skilled Nursing Facility | Payer: Self-pay | Source: Skilled Nursing Facility | Attending: Student

## 2024-01-18 ENCOUNTER — Encounter (HOSPITAL_COMMUNITY): Payer: Self-pay | Admitting: Internal Medicine

## 2024-01-18 DIAGNOSIS — D649 Anemia, unspecified: Secondary | ICD-10-CM | POA: Diagnosis present

## 2024-01-18 DIAGNOSIS — Z8781 Personal history of (healed) traumatic fracture: Secondary | ICD-10-CM | POA: Diagnosis not present

## 2024-01-18 DIAGNOSIS — K219 Gastro-esophageal reflux disease without esophagitis: Secondary | ICD-10-CM | POA: Diagnosis present

## 2024-01-18 DIAGNOSIS — Z741 Need for assistance with personal care: Secondary | ICD-10-CM | POA: Diagnosis not present

## 2024-01-18 DIAGNOSIS — E1122 Type 2 diabetes mellitus with diabetic chronic kidney disease: Secondary | ICD-10-CM | POA: Diagnosis present

## 2024-01-18 DIAGNOSIS — R531 Weakness: Secondary | ICD-10-CM | POA: Diagnosis not present

## 2024-01-18 DIAGNOSIS — K2100 Gastro-esophageal reflux disease with esophagitis, without bleeding: Secondary | ICD-10-CM | POA: Diagnosis not present

## 2024-01-18 DIAGNOSIS — Z8673 Personal history of transient ischemic attack (TIA), and cerebral infarction without residual deficits: Secondary | ICD-10-CM | POA: Diagnosis not present

## 2024-01-18 DIAGNOSIS — Z85828 Personal history of other malignant neoplasm of skin: Secondary | ICD-10-CM | POA: Diagnosis not present

## 2024-01-18 DIAGNOSIS — N1832 Chronic kidney disease, stage 3b: Secondary | ICD-10-CM | POA: Diagnosis present

## 2024-01-18 DIAGNOSIS — K922 Gastrointestinal hemorrhage, unspecified: Secondary | ICD-10-CM | POA: Diagnosis not present

## 2024-01-18 DIAGNOSIS — K449 Diaphragmatic hernia without obstruction or gangrene: Secondary | ICD-10-CM

## 2024-01-18 DIAGNOSIS — K254 Chronic or unspecified gastric ulcer with hemorrhage: Secondary | ICD-10-CM | POA: Diagnosis present

## 2024-01-18 DIAGNOSIS — M19011 Primary osteoarthritis, right shoulder: Secondary | ICD-10-CM | POA: Diagnosis not present

## 2024-01-18 DIAGNOSIS — K2101 Gastro-esophageal reflux disease with esophagitis, with bleeding: Secondary | ICD-10-CM | POA: Diagnosis present

## 2024-01-18 DIAGNOSIS — Z96642 Presence of left artificial hip joint: Secondary | ICD-10-CM | POA: Diagnosis present

## 2024-01-18 DIAGNOSIS — I10 Essential (primary) hypertension: Secondary | ICD-10-CM | POA: Diagnosis not present

## 2024-01-18 DIAGNOSIS — Z96653 Presence of artificial knee joint, bilateral: Secondary | ICD-10-CM | POA: Diagnosis present

## 2024-01-18 DIAGNOSIS — E119 Type 2 diabetes mellitus without complications: Secondary | ICD-10-CM | POA: Diagnosis not present

## 2024-01-18 DIAGNOSIS — K259 Gastric ulcer, unspecified as acute or chronic, without hemorrhage or perforation: Secondary | ICD-10-CM | POA: Diagnosis not present

## 2024-01-18 DIAGNOSIS — M810 Age-related osteoporosis without current pathological fracture: Secondary | ICD-10-CM | POA: Diagnosis present

## 2024-01-18 DIAGNOSIS — E875 Hyperkalemia: Secondary | ICD-10-CM | POA: Diagnosis present

## 2024-01-18 DIAGNOSIS — E785 Hyperlipidemia, unspecified: Secondary | ICD-10-CM | POA: Diagnosis present

## 2024-01-18 DIAGNOSIS — M6259 Muscle wasting and atrophy, not elsewhere classified, multiple sites: Secondary | ICD-10-CM | POA: Diagnosis not present

## 2024-01-18 DIAGNOSIS — I129 Hypertensive chronic kidney disease with stage 1 through stage 4 chronic kidney disease, or unspecified chronic kidney disease: Secondary | ICD-10-CM | POA: Diagnosis present

## 2024-01-18 DIAGNOSIS — D500 Iron deficiency anemia secondary to blood loss (chronic): Secondary | ICD-10-CM | POA: Diagnosis not present

## 2024-01-18 DIAGNOSIS — Z79899 Other long term (current) drug therapy: Secondary | ICD-10-CM | POA: Diagnosis not present

## 2024-01-18 DIAGNOSIS — Z6830 Body mass index (BMI) 30.0-30.9, adult: Secondary | ICD-10-CM | POA: Diagnosis not present

## 2024-01-18 DIAGNOSIS — S72142D Displaced intertrochanteric fracture of left femur, subsequent encounter for closed fracture with routine healing: Secondary | ICD-10-CM | POA: Diagnosis not present

## 2024-01-18 DIAGNOSIS — Z7984 Long term (current) use of oral hypoglycemic drugs: Secondary | ICD-10-CM | POA: Diagnosis not present

## 2024-01-18 DIAGNOSIS — G90512 Complex regional pain syndrome I of left upper limb: Secondary | ICD-10-CM | POA: Diagnosis present

## 2024-01-18 DIAGNOSIS — E1169 Type 2 diabetes mellitus with other specified complication: Secondary | ICD-10-CM | POA: Diagnosis not present

## 2024-01-18 DIAGNOSIS — D72829 Elevated white blood cell count, unspecified: Secondary | ICD-10-CM | POA: Diagnosis present

## 2024-01-18 DIAGNOSIS — D72825 Bandemia: Secondary | ICD-10-CM | POA: Diagnosis present

## 2024-01-18 DIAGNOSIS — R600 Localized edema: Secondary | ICD-10-CM | POA: Diagnosis not present

## 2024-01-18 DIAGNOSIS — R262 Difficulty in walking, not elsewhere classified: Secondary | ICD-10-CM | POA: Diagnosis not present

## 2024-01-18 DIAGNOSIS — E559 Vitamin D deficiency, unspecified: Secondary | ICD-10-CM | POA: Diagnosis not present

## 2024-01-18 DIAGNOSIS — E66811 Obesity, class 1: Secondary | ICD-10-CM | POA: Diagnosis present

## 2024-01-18 DIAGNOSIS — R110 Nausea: Secondary | ICD-10-CM | POA: Diagnosis not present

## 2024-01-18 DIAGNOSIS — E782 Mixed hyperlipidemia: Secondary | ICD-10-CM

## 2024-01-18 DIAGNOSIS — K5900 Constipation, unspecified: Secondary | ICD-10-CM | POA: Diagnosis not present

## 2024-01-18 DIAGNOSIS — H04129 Dry eye syndrome of unspecified lacrimal gland: Secondary | ICD-10-CM | POA: Diagnosis not present

## 2024-01-18 DIAGNOSIS — K921 Melena: Secondary | ICD-10-CM | POA: Diagnosis not present

## 2024-01-18 DIAGNOSIS — H109 Unspecified conjunctivitis: Secondary | ICD-10-CM | POA: Diagnosis not present

## 2024-01-18 DIAGNOSIS — D631 Anemia in chronic kidney disease: Secondary | ICD-10-CM | POA: Diagnosis present

## 2024-01-18 DIAGNOSIS — Z7982 Long term (current) use of aspirin: Secondary | ICD-10-CM | POA: Diagnosis not present

## 2024-01-18 DIAGNOSIS — D62 Acute posthemorrhagic anemia: Secondary | ICD-10-CM | POA: Diagnosis present

## 2024-01-18 DIAGNOSIS — R278 Other lack of coordination: Secondary | ICD-10-CM | POA: Diagnosis not present

## 2024-01-18 DIAGNOSIS — D539 Nutritional anemia, unspecified: Secondary | ICD-10-CM | POA: Diagnosis present

## 2024-01-18 DIAGNOSIS — R8271 Bacteriuria: Secondary | ICD-10-CM | POA: Diagnosis present

## 2024-01-18 HISTORY — PX: ESOPHAGOGASTRODUODENOSCOPY: SHX5428

## 2024-01-18 LAB — CBC WITH DIFFERENTIAL/PLATELET
Abs Immature Granulocytes: 0.11 10*3/uL — ABNORMAL HIGH (ref 0.00–0.07)
Basophils Absolute: 0.1 10*3/uL (ref 0.0–0.1)
Basophils Relative: 1 %
Eosinophils Absolute: 0.1 10*3/uL (ref 0.0–0.5)
Eosinophils Relative: 1 %
HCT: 22.5 % — ABNORMAL LOW (ref 36.0–46.0)
Hemoglobin: 7.1 g/dL — ABNORMAL LOW (ref 12.0–15.0)
Immature Granulocytes: 1 %
Lymphocytes Relative: 24 %
Lymphs Abs: 2.7 10*3/uL (ref 0.7–4.0)
MCH: 30.2 pg (ref 26.0–34.0)
MCHC: 31.6 g/dL (ref 30.0–36.0)
MCV: 95.7 fL (ref 80.0–100.0)
Monocytes Absolute: 0.7 10*3/uL (ref 0.1–1.0)
Monocytes Relative: 6 %
Neutro Abs: 7.6 10*3/uL (ref 1.7–7.7)
Neutrophils Relative %: 67 %
Platelets: 319 10*3/uL (ref 150–400)
RBC: 2.35 MIL/uL — ABNORMAL LOW (ref 3.87–5.11)
RDW: 19.7 % — ABNORMAL HIGH (ref 11.5–15.5)
WBC: 11.2 10*3/uL — ABNORMAL HIGH (ref 4.0–10.5)
nRBC: 0 % (ref 0.0–0.2)

## 2024-01-18 LAB — IRON AND TIBC
Iron: 35 ug/dL (ref 28–170)
Saturation Ratios: 9 % — ABNORMAL LOW (ref 10.4–31.8)
TIBC: 400 ug/dL (ref 250–450)
UIBC: 365 ug/dL

## 2024-01-18 LAB — URINALYSIS, COMPLETE (UACMP) WITH MICROSCOPIC
Bilirubin Urine: NEGATIVE
Glucose, UA: NEGATIVE mg/dL
Ketones, ur: NEGATIVE mg/dL
Nitrite: NEGATIVE
Protein, ur: NEGATIVE mg/dL
Specific Gravity, Urine: 1.011 (ref 1.005–1.030)
pH: 5 (ref 5.0–8.0)

## 2024-01-18 LAB — CBG MONITORING, ED
Glucose-Capillary: 78 mg/dL (ref 70–99)
Glucose-Capillary: 115 mg/dL — ABNORMAL HIGH (ref 70–99)

## 2024-01-18 LAB — CBC
HCT: 15.3 % — ABNORMAL LOW (ref 36.0–46.0)
Hemoglobin: 4.4 g/dL — CL (ref 12.0–15.0)
MCH: 30.1 pg (ref 26.0–34.0)
MCHC: 28.8 g/dL — ABNORMAL LOW (ref 30.0–36.0)
MCV: 104.8 fL — ABNORMAL HIGH (ref 80.0–100.0)
Platelets: 383 10*3/uL (ref 150–400)
RBC: 1.46 MIL/uL — ABNORMAL LOW (ref 3.87–5.11)
RDW: 22.2 % — ABNORMAL HIGH (ref 11.5–15.5)
WBC: 15.2 10*3/uL — ABNORMAL HIGH (ref 4.0–10.5)
nRBC: 0 % (ref 0.0–0.2)

## 2024-01-18 LAB — PREPARE RBC (CROSSMATCH)

## 2024-01-18 LAB — POC OCCULT BLOOD, ED: Fecal Occult Bld: POSITIVE — AB

## 2024-01-18 LAB — RETICULOCYTES
Immature Retic Fract: 48.9 % — ABNORMAL HIGH (ref 2.3–15.9)
RBC.: 1.45 MIL/uL — ABNORMAL LOW (ref 3.87–5.11)
Retic Count, Absolute: 218.8 10*3/uL — ABNORMAL HIGH (ref 19.0–186.0)
Retic Ct Pct: 15.1 % — ABNORMAL HIGH (ref 0.4–3.1)

## 2024-01-18 LAB — COMPREHENSIVE METABOLIC PANEL WITH GFR
ALT: 14 U/L (ref 0–44)
AST: 14 U/L — ABNORMAL LOW (ref 15–41)
Albumin: 3 g/dL — ABNORMAL LOW (ref 3.5–5.0)
Alkaline Phosphatase: 69 U/L (ref 38–126)
Anion gap: 9 (ref 5–15)
BUN: 71 mg/dL — ABNORMAL HIGH (ref 8–23)
CO2: 19 mmol/L — ABNORMAL LOW (ref 22–32)
Calcium: 9.1 mg/dL (ref 8.9–10.3)
Chloride: 109 mmol/L (ref 98–111)
Creatinine, Ser: 1.54 mg/dL — ABNORMAL HIGH (ref 0.44–1.00)
GFR, Estimated: 35 mL/min — ABNORMAL LOW (ref >60)
Glucose, Bld: 93 mg/dL (ref 70–99)
Potassium: 5 mmol/L (ref 3.5–5.1)
Sodium: 137 mmol/L (ref 135–145)
Total Bilirubin: 1.1 mg/dL (ref 0.0–1.2)
Total Protein: 5.6 g/dL — ABNORMAL LOW (ref 6.5–8.1)

## 2024-01-18 LAB — PROTIME-INR
INR: 1.1 (ref 0.8–1.2)
Prothrombin Time: 14.6 s (ref 11.4–15.2)

## 2024-01-18 LAB — HEMOGLOBIN AND HEMATOCRIT, BLOOD
HCT: 23.3 % — ABNORMAL LOW (ref 36.0–46.0)
HCT: 25.8 % — ABNORMAL LOW (ref 36.0–46.0)
Hemoglobin: 7.4 g/dL — ABNORMAL LOW (ref 12.0–15.0)
Hemoglobin: 8.3 g/dL — ABNORMAL LOW (ref 12.0–15.0)

## 2024-01-18 LAB — FOLATE: Folate: 36 ng/mL (ref >5.9)

## 2024-01-18 LAB — VITAMIN B12: Vitamin B-12: 592 pg/mL (ref 180–914)

## 2024-01-18 LAB — MAGNESIUM: Magnesium: 2.8 mg/dL — ABNORMAL HIGH (ref 1.7–2.4)

## 2024-01-18 LAB — FERRITIN: Ferritin: 63 ng/mL (ref 11–307)

## 2024-01-18 LAB — GLUCOSE, CAPILLARY: Glucose-Capillary: 115 mg/dL — ABNORMAL HIGH (ref 70–99)

## 2024-01-18 LAB — APTT: aPTT: 29 s (ref 24–36)

## 2024-01-18 SURGERY — EGD (ESOPHAGOGASTRODUODENOSCOPY)
Anesthesia: Monitor Anesthesia Care

## 2024-01-18 MED ORDER — AMLODIPINE BESYLATE 5 MG PO TABS
5.0000 mg | ORAL_TABLET | Freq: Every day | ORAL | Status: DC
  Administered 2024-01-18 – 2024-01-21 (×4): 5 mg via ORAL
  Filled 2024-01-18 (×4): qty 1

## 2024-01-18 MED ORDER — PANTOPRAZOLE SODIUM 40 MG IV SOLR
40.0000 mg | Freq: Once | INTRAVENOUS | Status: AC
  Administered 2024-01-18: 40 mg via INTRAVENOUS
  Filled 2024-01-18: qty 10

## 2024-01-18 MED ORDER — NALOXONE HCL 0.4 MG/ML IJ SOLN: 0.4000 mg | INTRAMUSCULAR | Status: DC | PRN

## 2024-01-18 MED ORDER — FENTANYL CITRATE PF 50 MCG/ML IJ SOSY: 25.0000 ug | PREFILLED_SYRINGE | INTRAMUSCULAR | Status: DC | PRN

## 2024-01-18 MED ORDER — SODIUM CHLORIDE 0.9% IV SOLUTION
Freq: Once | INTRAVENOUS | Status: DC
Start: 2024-01-18 — End: 2024-01-21

## 2024-01-18 MED ORDER — SODIUM CHLORIDE 0.9 % IV SOLN: INTRAVENOUS | Status: DC | PRN

## 2024-01-18 MED ORDER — INSULIN ASPART 100 UNIT/ML IJ SOLN
0.0000 [IU] | Freq: Four times a day (QID) | INTRAMUSCULAR | Status: DC
  Administered 2024-01-19: 1 [IU] via SUBCUTANEOUS

## 2024-01-18 MED ORDER — IRON SUCROSE 300 MG IVPB - SIMPLE MED
300.0000 mg | Freq: Once | Status: AC
  Administered 2024-01-18: 300 mg via INTRAVENOUS
  Filled 2024-01-18: qty 300

## 2024-01-18 MED ORDER — ONDANSETRON HCL 4 MG/2ML IJ SOLN: 4.0000 mg | Freq: Four times a day (QID) | INTRAMUSCULAR | Status: DC | PRN

## 2024-01-18 MED ORDER — ACETAMINOPHEN 650 MG RE SUPP: 650.0000 mg | Freq: Four times a day (QID) | RECTAL | Status: DC | PRN

## 2024-01-18 MED ORDER — AMLODIPINE BESYLATE 5 MG PO TABS: 10.0000 mg | ORAL_TABLET | Freq: Every day | ORAL | Status: DC

## 2024-01-18 MED ORDER — PROPOFOL 10 MG/ML IV BOLUS
INTRAVENOUS | Status: DC | PRN
  Administered 2024-01-18: 20 mg via INTRAVENOUS
  Administered 2024-01-18: 50 mg via INTRAVENOUS
  Administered 2024-01-18: 20 mg via INTRAVENOUS

## 2024-01-18 MED ORDER — FENTANYL CITRATE PF 50 MCG/ML IJ SOSY: 12.5000 ug | PREFILLED_SYRINGE | INTRAMUSCULAR | Status: DC | PRN

## 2024-01-18 MED ORDER — HYDRALAZINE HCL 25 MG PO TABS: 25.0000 mg | ORAL_TABLET | Freq: Four times a day (QID) | ORAL | Status: DC | PRN

## 2024-01-18 MED ORDER — PROPOFOL 500 MG/50ML IV EMUL
INTRAVENOUS | Status: DC | PRN
  Administered 2024-01-18: 100 ug/kg/min via INTRAVENOUS

## 2024-01-18 MED ORDER — PHENYLEPHRINE HCL (PRESSORS) 10 MG/ML IV SOLN
INTRAVENOUS | Status: DC | PRN
Start: 2024-01-18 — End: 2024-01-18
  Administered 2024-01-18: 160 ug via INTRAVENOUS
  Administered 2024-01-18: 240 ug via INTRAVENOUS

## 2024-01-18 MED ORDER — PANTOPRAZOLE SODIUM 40 MG IV SOLR
40.0000 mg | Freq: Two times a day (BID) | INTRAVENOUS | Status: DC
  Administered 2024-01-18 – 2024-01-19 (×3): 40 mg via INTRAVENOUS
  Filled 2024-01-18 (×3): qty 10

## 2024-01-18 MED ORDER — ACETAMINOPHEN 325 MG PO TABS: 650.0000 mg | ORAL_TABLET | Freq: Four times a day (QID) | ORAL | Status: DC | PRN

## 2024-01-18 NOTE — Anesthesia Postprocedure Evaluation (Signed)
 Anesthesia Post Note  Patient: Alexis Hopkins  Procedure(s) Performed: EGD (ESOPHAGOGASTRODUODENOSCOPY)     Patient location during evaluation: PACU Anesthesia Type: MAC Level of consciousness: awake and alert Pain management: pain level controlled Vital Signs Assessment: post-procedure vital signs reviewed and stable Respiratory status: spontaneous breathing, nonlabored ventilation and respiratory function stable Cardiovascular status: stable and blood pressure returned to baseline Anesthetic complications: no   No notable events documented.  Last Vitals:  Vitals:   01/18/24 1615 01/18/24 1624  BP: (!) 141/63   Pulse: 94   Resp: (!) 23   Temp:  36.7 C  SpO2: 100%     Last Pain:  Vitals:   01/18/24 1624  TempSrc: Oral  PainSc:                  Juventino Oppenheim

## 2024-01-18 NOTE — Progress Notes (Signed)
 PROGRESS NOTE  Alexis Hopkins XTG:[id number] DOB: 1946-09-12   PCP: Glena Landau, MD  Patient is from: SNF  DOA: 01/17/2024 LOS: 0  Chief complaints Chief Complaint  Patient presents with   Low Hgb 4.4     Brief Narrative / Interim history: 78 year old F with PMH of recent left hip fracture for which she had IM, CKD-3B, DM-2, HTN, HLD, anemia and GERD sent to ED from SNF due to melanotic stool and generalized weakness, and admitted with symptomatic acute on chronic blood loss anemia due to suspected upper GI bleed.  Patient was on aspirin  81 mg twice daily since she had hip surgery last month.  In ED, stable vitals.  BUN 79.  Cr 1.68 (was 1.5 on 3/26).  Hgb 4.4 (was 7.9 on discharge last month, and reportedly 9.0 at SNF).  Hemoccult positive.  Two units of blood ordered.  Started on IV Protonix .  [facility], Dr. Feliberto Hopping consulted.   Subjective: Seen and examined earlier this morning.  No major events overnight of this morning.  Patient's son at bedside.  She received 2 units of blood overnight.  Hemoglobin improved to 7.1.  Feels better and stronger.  Denies chest pain or shortness of breath.  Denies palpitation.  Objective: Vitals:   01/18/24 0800 01/18/24 0900 01/18/24 0925 01/18/24 0955  BP: 130/72 (!) 142/58 (!) 142/59 (!) 154/59  Pulse: 96 89 87 (!) 103  Resp: 16 19 18 16   Temp:      TempSrc:      SpO2: 100% 94% 94% 95%    Examination:  GENERAL: No apparent distress.  Nontoxic. HEENT: MMM.  Vision and hearing grossly intact.  NECK: Supple.  No apparent JVD.  RESP:  No IWOB.  Fair aeration bilaterally. CVS:  RRR. Heart sounds normal.  ABD/GI/GU: BS+. Abd soft, NTND.  MSK/EXT:  Moves extremities. No apparent deformity.  LLE edema. SKIN: no apparent skin lesion or wound NEURO: Awake, alert and oriented appropriately.  No apparent focal neuro deficit. PSYCH: Calm. Normal affect.   Consultants:  Gastroenterology  Procedures: None  Microbiology  summarized: None  Assessment and plan: Symptomatic iron  deficiency anemia due to acute on chronic blood loss from upper GI bleed: Presents with melena and generalized weakness.  Hemoccult positive.  Patient was aspirin  81 mg twice daily after recent hip surgery.  Anemia panel with iron  deficiency.  She has significant azotemia.  Transfused 2 units with appropriate response. Recent Labs    12/18/23 0518 12/19/23 0341 12/21/23 0320 12/22/23 0349 12/23/23 0319 12/24/23 0327 12/25/23 0331 12/26/23 0336 01/17/24 2257 01/18/24 0645  HGB 8.8* 8.9* 8.0* 7.8* 7.5* 8.0* 7.7* 7.9* 4.4* 7.1*  -Monitor H&H and transfuse for Hgb less than 7.0 -IV Venofer  300 mg x 1 -Continue IV Protonix  40 mg twice daily -Secure to IV lines.  Ordered additional IV line -Continue holding aspirin .  N.p.o. pending GI evaluation -Follow GI recommendation.  Consulted on admission.  NIDDM-2 with CKD-3B: A1c 6.7% on 3/13.  On glimepiride  and metformin  at home Recent Labs  Lab 01/18/24 0552  GLUCAP 78  - Continue SSI-very sensitive every 6 hours while NPO - Hold home medications  CKD-3B: Baseline Cr ~1.3.  Could be due to to anemia, lisinopril  and HCTZ.  Improving. Recent Labs    12/13/23 1455 12/14/23 0540 12/15/23 0625 12/16/23 0523 12/17/23 0446 12/18/23 0518 12/19/23 0341 12/26/23 0336 01/17/24 2257 01/18/24 0645  BUN 32* 39* 46* 37* 32* 36* 36* 46* 79* 71*  CREATININE 1.33* 1.78* 1.76* 1.22*  1.09* 1.19* 1.00 1.49* 1.65* 1.54*  - Continue holding lisinopril  and HCTZ to allow renal recovery - Treat anemia as above - Continue monitoring  Essential hypertension: BP slightly elevated - Resume home amlodipine  at reduced dose - P.o. hydralazine  as needed - Continue holding lisinopril  and HCTZ  Left lower extremity edema: Chronic for patient.  Patient and patient's son reports that she had lower extremity venous Doppler at SNF that was negative for DVT.  No calf tenderness on exam.  Recent right hip  fracture s/p IM nailing: Recall wound healed.  Staples out. - Outpatient follow-up with orthopedic surgery - PT/OT  Generalized weakness: Likely due to anemia - Treat anemia as above - PT/OT  Leukocytosis/bandemia: Likely demargination.  No signs of infection.  Improved without antibiotics.  Hyperkalemia: Resolved.   There is no height or weight on file to calculate BMI.          DVT prophylaxis:  SCDs Start: 01/18/24 0114  Code Status: Full code Family Communication: Updated patient's son at bedside Level of care: Progressive Status is: Observation The patient will require care spanning > 2 midnights and should be moved to inpatient because: Symptomatic iron  deficiency anemia due to acute on chronic blood loss from GI bleed   Final disposition: To be determined   55 minutes with more than 50% spent in reviewing records, counseling patient/family and coordinating care.   Sch Meds:  Scheduled Meds:  sodium chloride    Intravenous Once   insulin  aspart  0-6 Units Subcutaneous Q6H   pantoprazole  (PROTONIX ) IV  40 mg Intravenous Q12H   Continuous Infusions:  iron  sucrose     PRN Meds:.acetaminophen  **OR** acetaminophen , fentaNYL  (SUBLIMAZE ) injection, naLOXone  (NARCAN )  injection, ondansetron  (ZOFRAN ) IV  Antimicrobials: Anti-infectives (From admission, onward)    None        I have personally reviewed the following labs and images: CBC: Recent Labs  Lab 01/17/24 2257 01/18/24 0645  WBC 15.2* 11.2*  NEUTROABS  --  7.6  HGB 4.4* 7.1*  HCT 15.3* 22.5*  MCV 104.8* 95.7  PLT 383 319   BMP &GFR Recent Labs  Lab 01/17/24 2257 01/18/24 0645  NA 134* 137  K 5.2* 5.0  CL 106 109  CO2 16* 19*  GLUCOSE 133* 93  BUN 79* 71*  CREATININE 1.65* 1.54*  CALCIUM  9.5 9.1  MG  --  2.8*   CrCl cannot be calculated (Unknown ideal weight.). Liver & Pancreas: Recent Labs  Lab 01/17/24 2257 01/18/24 0645  AST 20 14*  ALT 16 14  ALKPHOS 75 69  BILITOT 0.7  1.1  PROT 5.9* 5.6*  ALBUMIN  3.2* 3.0*   No results for input(s): "LIPASE", "AMYLASE" in the last 168 hours. No results for input(s): "AMMONIA" in the last 168 hours. Diabetic: No results for input(s): "HGBA1C" in the last 72 hours. Recent Labs  Lab 01/18/24 0552  GLUCAP 78   Cardiac Enzymes: No results for input(s): "CKTOTAL", "CKMB", "CKMBINDEX", "TROPONINI" in the last 168 hours. No results for input(s): "PROBNP" in the last 8760 hours. Coagulation Profile: No results for input(s): "INR", "PROTIME" in the last 168 hours. Thyroid  Function Tests: No results for input(s): "TSH", "T4TOTAL", "FREET4", "T3FREE", "THYROIDAB" in the last 72 hours. Lipid Profile: No results for input(s): "CHOL", "HDL", "LDLCALC", "TRIG", "CHOLHDL", "LDLDIRECT" in the last 72 hours. Anemia Panel: Recent Labs    01/17/24 2257 01/18/24 0645  FOLATE 36.0  --   FERRITIN  --  63  TIBC  --  400  IRON   --  35  RETICCTPCT 15.1*  --    Urine analysis:    Component Value Date/Time   COLORURINE YELLOW 01/18/2024 0658   APPEARANCEUR HAZY (A) 01/18/2024 0658   LABSPEC 1.011 01/18/2024 0658   PHURINE 5.0 01/18/2024 0658   GLUCOSEU NEGATIVE 01/18/2024 0658   HGBUR SMALL (A) 01/18/2024 0658   BILIRUBINUR NEGATIVE 01/18/2024 0658   KETONESUR NEGATIVE 01/18/2024 0658   PROTEINUR NEGATIVE 01/18/2024 0658   UROBILINOGEN 0.2 03/04/2015 1003   NITRITE NEGATIVE 01/18/2024 0658   LEUKOCYTESUR MODERATE (A) 01/18/2024 0658   Sepsis Labs: Invalid input(s): "PROCALCITONIN", "LACTICIDVEN"  Microbiology: No results found for this or any previous visit (from the past 240 hours).  Radiology Studies: No results found.    Oluwatobiloba Martin T. Britany Callicott [facility]  If 7PM-7AM, please contact night-coverage [email or web address] 01/18/2024, 10:49 AM

## 2024-01-18 NOTE — Anesthesia Preprocedure Evaluation (Addendum)
 Anesthesia Evaluation  Patient identified by MRN, date of birth, ID band Patient awake    Reviewed: Allergy & Precautions, NPO status , Patient's Chart, lab work & pertinent test results  History of Anesthesia Com

## 2024-01-18 NOTE — ED Notes (Signed)
Patient transported to Endo

## 2024-01-18 NOTE — H&P (Signed)
 History and Physical      Alexis Hopkins:[id number] DOB: 04/07/1946 DOA: 01/17/2024; DOS: 01/18/2024  PCP: Loreli Kins, MD  Patient coming from: SNF   I have personally briefly reviewed patient's old medical records in [facility]  Chief Complaint: Dark-colored stool  HPI: SHAKIRA LOS is a 78 y.o. female with medical history significant for recent left hip fracture status post intramedullary nail to the left femur on 12/14/2023, type 2 diabetes mellitus, hypertension, hyperlipidemia, GERD, CKD 3B with baseline creatinine 1.1-1.5, anemia of chronic disease associated baseline hemoglobin 9-12, who is admitted to [facility] on 01/17/2024 with suspected acute upper gastrointestinal bleed after presenting from SNF  to [facility] ED complaining of dark-colored stool.   The patient was recently hospitalized at [facility] from 12/13/2023 to 12/26/2023 with left hip fracture, status post IM nail to the left femur on 12/14/2023.  She developed postoperative anemia, with hemoglobin of 6.7 on postop day #1, 12/15/2023, for which she received received RBC transfusion.  She was subsequent discharged to SNF on 12/26/2023, at which time her most recent prior hemoglobin was noted to be 7.9.  Over the last 3 days, she has reported new onset generalized weakness in the absence of any acute focal weakness.  Not associate any subjective fever, chills, rigors, or generalized myalgias.  She was also noted to be constipated, prompting administration of laxative by staff at [facility].  Ensuing bowel movement was noted to be black in coloration, which represents a change for her.  Not associate with any hematochezia.  In light of this change in stool coloration, the patient was brought to [facility] emergency department for further evaluation management thereof.  Post-op following recent IM nail to the left femur, she is on baby aspirin  twice daily, but otherwise on no additional blood thinners as an  outpatient.  She denies any recent chest pain, palpitations, diaphoresis nausea, vomiting, dizziness, presyncope, or syncope.  No recent abdominal pain or hematemesis.  No history of alcohol  abuse or recent alcohol  consumption.  No known history of underlying liver disease.  She has a history of GERD for which she is on Pepcid on a twice daily basis at home.  She has a history of chronic anemia associated baseline hemoglobin 9-12, and is on oral iron  supplementation as an outpatient.  No recent NSAID use.  She has previously followed with [facility] gastroenterology as an outpatient.    ED Course:  Vital signs in the ED were notable for the following: Afebrile; heart rates in the 80s to 90s; stop blood pressures in the 120s to 140s; respiratory rate 15-20, oxygen saturation 96 to 100% on room air.  Labs were notable for the following: CMP was notable for the following: BUN 79 compared to 46 on 12/26/2023, creatinine 1.65 compared to 1.49 on 12/26/2023, glucose 133, albumin  3.2.  Otherwise, liver enzymes are within normal limits.  CBC notable for white cell count 15,200 relative to most recent prior will blood cell count 11,400 on 12/26/2023, hemoglobin 4.4 associated with macrocytic and hypochromic findings as well as elevated RDW, and relative demonstration prior hemoglobin data point to 7.9 on 12/26/2023, platelet count 383.  Type and screen was ordered.  DRE performed by EDP showed black-colored stool that was fecal occult blood positive.  Per my interpretation, EKG in ED demonstrated the following: No EKG performed in the ED today.  Imaging in the ED, per corresponding formal radiology read, was notable for the following: No imaging performed in  the ED today.  EDP discussed with on-call [facility] gastroenterology, Dr. Saintclair, who will formally consult and see the patient morning.   While in the ED, the following were administered: Protonix  40 mg IV x 1 dose.  Transfusion of 2 units.  BC was  initiated.  Subsequently, the patient was admitted for further evaluation management of suspected acute upper gastrointestinal bleed complicated by subacute on chronic anemia, presenting with generalized weakness, with presenting labs also notable for leukocytosis.     Review of Systems: As per HPI otherwise 10 point review of systems negative.   Past Medical History:  Diagnosis Date   Anemia    Arthritis    Basal cell carcinoma 01/08/2014   BCC NODULAR NOSE MOHS   Cancer (HCC)    SKIN CANCERS REMOVED   Diabetes mellitus without complication (HCC)    Type II   Family history of adverse reaction to anesthesia    mother always was deathly sick   History of kidney stones 40 YRS AGO   Hx of skin cancer, basal cell    Hyperlipemia    Hypertension    Osteoporosis    PONV (postoperative nausea and vomiting)    RSD (reflex sympathetic dystrophy)    L ARM   Squamous cell carcinoma of skin    ABOVE LEFT BROW CX3 5FU   TIA (transient ischemic attack) 1994   Takes Plavix     Past Surgical History:  Procedure Laterality Date   ABDOMINAL HYSTERECTOMY  10/02/1993   DILATION AND CURETTAGE OF UTERUS     FEMUR IM NAIL Left 12/14/2023   Procedure: INSERTION, INTRAMEDULLARY ROD, FEMUR;  Surgeon: Fidel Rogue, MD;  Location: [facility] ORS;  Service: Orthopedics;  Laterality: Left;   KNEE ARTHROSCOPY     X 1 on right; X 2 on left   MOHS SURGERY     REVERSE SHOULDER ARTHROPLASTY Right 10/03/2013   Procedure: RIGHT REVERSE SHOULDER ARTHROPLASTY;  Surgeon: Elspeth JONELLE Her, MD;  Location: [facility] OR;  Service: Orthopedics;  Laterality: Right;   REVERSE SHOULDER ARTHROPLASTY Left 07/21/2021   Procedure: REVERSE SHOULDER ARTHROPLASTY;  Surgeon: Her Kemps, MD;  Location: [facility] OR;  Service: Orthopedics;  Laterality: Left;   ROTATOR CUFF REPAIR Right    SHOULDER ARTHROSCOPY Right    TOTAL KNEE ARTHROPLASTY Right 10/05/2014   Procedure: RIGHT TOTAL KNEE ARTHROPLASTY;  Surgeon: Donnice JONETTA Car, MD;   Location: [facility];  Service: Orthopedics;  Laterality: Right;   TOTAL KNEE ARTHROPLASTY Left 03/09/2015   Procedure: LEFT TOTAL KNEE ARTHROPLASTY;  Surgeon: Donnice Car, MD;  Location: [facility];  Service: Orthopedics;  Laterality: Left;    Social History:  reports that she has never smoked. She has never used smokeless tobacco. She reports that she does not drink alcohol  and does not use drugs.   Allergies  Allergen Reactions   Motrin [Ibuprofen] Other (See Comments)    Per MD-- does not want her taking.    Shellfish Allergy Hives    Shrimp.    Family History  Problem Relation Age of Onset   Breast cancer Neg Hx     Family history reviewed and not pertinent    Prior to Admission medications   Medication Sig Start Date End Date Taking? Authorizing Provider  amLODipine  (NORVASC ) 10 MG tablet Take 10 mg by mouth every morning.    Yes [provider]  aspirin  (ASPIRIN  CHILDRENS) 81 MG chewable tablet Chew 1 tablet (81 mg total) by mouth 2 (two) times daily with a meal. 12/14/23  01/28/24 Yes Hill, Valery RAMAN, PA-C  calcium -vitamin D  (OSCAL WITH D) 500-200 MG-UNIT per tablet Take 1 tablet by mouth every morning.    Yes [provider]  colesevelam  (WELCHOL ) 625 MG tablet Take 1,250 mg by mouth 2 (two) times daily with a meal.   Yes [provider]  docusate sodium  (COLACE) 100 MG capsule Take 200 mg by mouth daily.   Yes [provider]  famotidine (PEPCID) 20 MG tablet Take 20 mg by mouth 2 (two) times daily.   Yes [provider]  ferrous sulfate  325 (65 FE) MG tablet Take 1 tablet (325 mg total) by mouth daily with breakfast. 12/27/23  Yes Jadine Toribio SQUIBB, MD  glimepiride  (AMARYL ) 2 MG tablet Take 1 mg by mouth 2 (two) times daily.   Yes [provider]  hydrochlorothiazide  (HYDRODIURIL ) 12.5 MG tablet Take 12.5 mg by mouth every morning.   Yes [provider]  amoxicillin  (AMOXIL ) 500 MG capsule Take 2,000 mg by mouth See  admin instructions. Take 4 capsules (2000 mg) by mouth 1 hour prior to dental appointments Patient not taking: Reported on 12/13/2023 05/02/21   [provider]  b complex vitamins capsule Take 1 capsule by mouth daily.    [provider]  lisinopril  (PRINIVIL ,ZESTRIL ) 40 MG tablet Take 40 mg by mouth every morning.     [provider]  metFORMIN  (GLUCOPHAGE ) 1000 MG tablet Take 1,000 mg by mouth 2 (two) times daily with a meal.    [provider]  methocarbamol  (ROBAXIN ) 500 MG tablet Take 1 tablet (500 mg total) by mouth 3 (three) times daily as needed for muscle spasms. 12/26/23   Jadine Toribio SQUIBB, MD  Misc Natural Products (OSTEO BI-FLEX TRIPLE STRENGTH PO) Take 1 tablet by mouth 2 (two) times daily. Patient not taking: Reported on 12/13/2023    [provider]  Multiple Vitamin (MULTIVITAMIN WITH MINERALS) TABS tablet Take 1 tablet by mouth every morning.     [provider]  OVER THE COUNTER MEDICATION Place 1 application into both eyes at bedtime. Pure and gentle lubricant eye pad    [provider]  Polyvinyl Alcohol -Povidone PF (REFRESH) 1.4-0.6 % SOLN Place 1 drop into both eyes daily as needed (Dry eye).    [provider]  pravastatin  (PRAVACHOL ) 40 MG tablet Take 40 mg by mouth daily. 03/11/21   [provider]  sodium chloride  (OCEAN) 0.65 % SOLN nasal spray Place 1-2 sprays into both nostrils daily as needed (Dry nose).    [provider]     Objective    Physical Exam: Vitals:   01/17/24 2248 01/18/24 0030 01/18/24 0031 01/18/24 0046  BP: (!) 144/51 (!) 128/42 (!) 127/47 (!) 124/39  Pulse: 98 88 87 86  Resp: 16 20 20 15   Temp: (!) 97.5 F (36.4 C)  98.1 F (36.7 C) 98 F (36.7 C)  TempSrc:   Oral Oral  SpO2: 100% 96%      General: appears to be stated age; alert, oriented Skin: warm, dry, no rash Head:  AT/[place] Mouth:  Oral mucosa membranes appear moist, normal dentition Neck:  supple; trachea midline Heart:  RRR; did not appreciate any M/R/G Lungs: CTAB, did not appreciate any wheezes, rales, or rhonchi Abdomen: + BS; soft, ND, NT Vascular: 2+ pedal pulses b/l; 2+ radial pulses b/l Extremities: no peripheral edema, no muscle wasting Neuro: strength and sensation intact in upper and lower extremities b/l   Labs on Admission: I have personally  reviewed following labs and imaging studies  CBC: Recent Labs  Lab 01/17/24 2257  WBC 15.2*  HGB 4.4*  HCT 15.3*  MCV 104.8*  PLT 383   Basic Metabolic Panel: Recent Labs  Lab 01/17/24 2257  NA 134*  K 5.2*  CL 106  CO2 16*  GLUCOSE 133*  BUN 79*  CREATININE 1.65*  CALCIUM  9.5   GFR: CrCl cannot be calculated (Unknown ideal weight.). Liver Function Tests: Recent Labs  Lab 01/17/24 2257  AST 20  ALT 16  ALKPHOS 75  BILITOT 0.7  PROT 5.9*  ALBUMIN  3.2*   No results for input(s): LIPASE, AMYLASE in the last 168 hours. No results for input(s): AMMONIA in the last 168 hours. Coagulation Profile: No results for input(s): INR, PROTIME in the last 168 hours. Cardiac Enzymes: No results for input(s): CKTOTAL, CKMB, CKMBINDEX, TROPONINI in the last 168 hours. BNP (last 3 results) No results for input(s): PROBNP in the last 8760 hours. HbA1C: No results for input(s): HGBA1C in the last 72 hours. CBG: No results for input(s): GLUCAP in the last 168 hours. Lipid Profile: No results for input(s): CHOL, HDL, LDLCALC, TRIG, CHOLHDL, LDLDIRECT in the last 72 hours. Thyroid  Function Tests: No results for input(s): TSH, T4TOTAL, FREET4, T3FREE, THYROIDAB in the last 72 hours. Anemia Panel: No results for input(s): VITAMINB12, FOLATE, FERRITIN, TIBC, IRON , RETICCTPCT in the last 72 hours. Urine analysis:    Component Value Date/Time   COLORURINE YELLOW 03/04/2015 1003   APPEARANCEUR CLEAR 03/04/2015 1003   LABSPEC 1.019 03/04/2015 1003    PHURINE 5.0 03/04/2015 1003   GLUCOSEU NEGATIVE 03/04/2015 1003   HGBUR TRACE (A) 03/04/2015 1003   BILIRUBINUR NEGATIVE 03/04/2015 1003   KETONESUR NEGATIVE 03/04/2015 1003   PROTEINUR NEGATIVE 03/04/2015 1003   UROBILINOGEN 0.2 03/04/2015 1003   NITRITE NEGATIVE 03/04/2015 1003   LEUKOCYTESUR SMALL (A) 03/04/2015 1003    Radiological Exams on Admission: No results found.    Assessment/Plan   Principal Problem:   Acute upper GI bleed Active Problems:   DM2 (diabetes mellitus, type 2) (HCC)   Essential hypertension   Acute on chronic anemia   Generalized weakness   Leukocytosis   HLD (hyperlipidemia)   GERD (gastroesophageal reflux disease)   History of fracture of left hip   CKD stage 3b, GFR 30-44 ml/min (HCC)     #) Acute Upper GI Bleed: diagnosis on the basis of new onset melena over the last 2 days, with interval elevation in BUN from 46-79 representing a proportional increase relative to a mild increase in interval creatinine values, as further quantified above,  and with DRE revealing black-colored stool that was Hemoccult positive stool. Also a/w evidence of subacute on chronic anemia , with presenting Hgb noted to be 4.4.   On aspirin  81 mg p.o. twice daily in the postoperative setting following recent intramedullary nail to the left femur on 12/14/2023.  Otherwise, no additional blood thinners as an outpatient.  No recent NSAID use.  No known history of known underlying liver disease, and denies any history of alcohol  abuse or recent alcohol  consumption. Has history of GERD, and acknowledges good compliance with outpatient Pepcid.   In the absence of known liver disease, initiation of SBP prophylaxis does not appear to be warranted. Presentation and history are less suggestive of variceal bleed, and therefore there does not appear to be an indication for octreotide. Given suspected upper GI source, will continue IV Protonix .  At this time ddx broadly includes, but is  not limited to gastritis vs erosive esophagitis vs PUD (gastric versus duodenal distribution) vs Dieulafoy lesion vs AVM.    At this time, the patient appears hemodynamically stable, with normotensive blood pressures in the absence of any associated tachycardia.  Aside from generalized weakness, she appears otherwise asymptomatic.   EDP d/w on-call [facility], Dr. Saintclair, who will consult and see pt in the AM, at which EGD is anticipated.   Plan: NPO. Refraining from pharmacologic DVT prophylaxis.  Holding aspirin  for now.  Monitor on telemetry. Maintain at least 2 large bore IV's.  Continue with transfusion of 2 units PRBC .  Add on PTT, INR, iron  studies, B12, folic acid level, reticulocyte count. Q4H H&H's have been ordered through 13 on 01/18/2024. Will closely monitor these ensuing Hgb levels and correlate these data points with the patient's overall clinical picture including vital signs to determine need for subsequent transfusion. On-call GI consulted, as above, with additional recs pending at this time. Further evaluation and management of corresponding subacute on chronic anemia, as further detailed below. CMP in the AM. Protonix  40 mg IV twice daily.  Hold home HCTZ, lisinopril , and Norvasc  for now.  Holding home oral iron  supplementation for now.                 #) Subacute on chronic anemia: Relative to the patient's chronic anemia with baseline hemoglobin 9-12, she was noted to be mildly anemic postoperatively relative to the this baseline following her IM nail to the left femur on 12/14/2023, including requiring PRBC transfusion on 12/15/2023 for hemoglobin level of 6.7.  However, she presents today with hemoglobin now even lower, with presenting hemoglobin 4.4, concerning with contribution from acute upper gastrointestinal bleed, as further detailed above.  Her relative hemodynamic stability and in the relative the asymptomatic nature of her presentation, suspect that this is more  of a subacute process over the last few weeks as opposed to representing a brisk gastrointestinal bleed 1 to 2 days.   Of note, presenting hemoglobin 4.4 is found to be associated with a macrocytic resolved as well as hypochromic finding and RDW.  Given the macrocytic anemia , will also check B12, folic acid level.  She was started on transfusion of 2 units PRBC in the ED.  Will pursue additional diagnostic laboratory evaluation via pretransfusion specimen analysis, as further detailed below.   Plan: work-up and management for presenting suspected acute upper GI bleed, as above.  Continue with transfusion of 2 units PRBC, as initiated by EDP this evening.  Every 4 hour H&H's ordered through 1300 on 01/18/2024.  [facility] gastroenterology has been consulted, and will see the patient in the morning, at which time EGD is anticipated. Monitor on telemetry. Refraining from pharmacologic DVT prophylaxis.  Holding outpatient aspirin  for now hold outpatient aspirin  for now. Add on the following to initial lab specimen collected in the ED today: total iron , TIBC, ferritin, B12, folic acid level, reticulocyte count., PTT, INR.                        #) Leukocytosis: Presenting CBC reflects mildly elevated white cell count of 15,200, up slightly from most recent prior with cell count of 11,400 on 12/26/2023. Suspect potential reactive contribution from presenting acute upper gastrointestinal bleed, as well as a potential element of hemoconcentration given interval decline in intravascular volume as a consequence of presenting subacute on chronic anemia.  No evidence to suggest underlying infectious process at this time,  but will also check urinalysis given the patient's reported generalized weakness.  In the absence of overt underlying infectious source, criteria for sepsis are not currently met.  She appears hemodynamically stable.  Therefore, we will refrain from initiation of antibiotics at this  time.  Plan: Repeat CBC with diff in the morning.  Monitor strict I's and O's, daily weights.  Check urinalysis.  Further evaluation management of presenting acute upper gastrointestinal bleed, as above.                        #) Generalized weakness: 3-day duration of generalized weakness, in the absence of any evidence of acute focal neurologic deficits, including no evidence of acute focal weakness to suggest acute CVA.  Suspect contribution from physiologic stress stemming from presenting subacute on chronic anemia in the setting of acute upper gastrointestinal bleed, as above.  No e/o infectious process at this time, but will also check urinalysis.  Will further eval for any additional contributions from endocrine/metabolic sources, as detailed below.   Plan: work-up and management of presenting subacute on chronic anemia in the setting of acute upper gastrointestinal bleed, as described above. PT/OT consults ordered for the AM. Fall precautions. CMP/CBC in the AM. Check B12, serum Mg level. Check urinalysis.                      #) Type 2 Diabetes Mellitus: documented history of such. Home insulin  regimen: None. Home oral hypoglycemic agents: Metformin , glimepiride . presenting blood sugar: 133. Most recent A1c noted to be 6.7% when checked on 12/13/2023.  Plan: In the setting of current n.p.o. status, will pursue Accu-Cheks every 6 hours with associated sliding scale insulin .  Hold home oral hypoglycemic agents during this hospitalization.                      #) Essential Hypertension: documented h/o such, with outpatient antihypertensive regimen including lisinopril , amlodipine , HCTZ.  SBP's in the ED today: 120s 140s mmHg. in the setting of her n.p.o. status as well as acute upper gastrointestinal bleed, hold home antihypertensive medications for now.  Plan: Close monitoring of subsequent BP via routine VS. hold outpatient HCTZ,  lisinopril , amlodipine  for now.  Further evaluation management of acute upper gastrointestinal bleed, as above.  Monitor on telemetry.  Monitor strict I's and O's and daily weights.                      #) Hyperlipidemia: documented h/o such. On pravastatin  as well as WelChol  as outpatient.   Plan: In the setting of current n.p.o. status, will hold home pravastatin  and WelChol  for now.                       #) History of left hip fracture: Status post intramedullary nail on 12/14/2023.  Plan: Hold postoperative aspirin  for now in the setting of presenting acute upper gastrointestinal bleed.  Repeat CBC in the morning.  Prn IV fentanyl .                    #) GERD: documented h/o such; on Pepcid as outpatient.   Plan: In the setting of presenting acute upper gastrointestinal bleed, will hold home Pepcid and lieu of Protonix  40 mg IV twice daily for now.                     #)  CKD Stage 3B: Documented history of such, with baseline creatinine 1.1-1.5, with presenting creatinine consistent with this baseline.   Plan: Monitor strict I's and O's and daily weights.  Attempt to avoid nephrotoxic agents.  CMP/magnesium  level in the AM.       DVT prophylaxis: SCD's   Code Status: Full code Family Communication: I discussed pt's case with her son, who was present at bedside; Disposition Plan: Per Rounding Team Consults called: EDP discussed with on-call [facility], Dr. Saintclair, who will formally consult and see the patient morning. ;  Admission status: observation     I SPENT GREATER THAN 75  MINUTES IN CLINICAL CARE TIME/MEDICAL DECISION-MAKING IN COMPLETING THIS ADMISSION.      Derin Matthes B Yahshua Thibault DO [facility]  From 7PM - 7AM   01/18/2024, 1:17 AM

## 2024-01-18 NOTE — Evaluation (Signed)
 Physical Therapy Evaluation Patient Details Name: Alexis Hopkins MRN: [id number] DOB: 1946/01/30 Today's Date: 01/18/2024  History of Present Illness  Alexis Hopkins is a 78 y.o. female who is admitted to [facility] on 01/17/2024 with suspected acute upper gastrointestinal bleed after presenting from SNF to [facility] ED complaining of dark-colored stool and Low HGB opf 4.4.  Past medical history significant for recent left hip fracture status post intramedullary nail to the left femur on 12/14/2023, type 2 diabetes mellitus, hypertension, hyperlipidemia, GERD, CKD 3B with baseline creatinine 1.1-1.5, anemia of chronic disease associated baseline hemoglobin 9-12,  Clinical Impression   Doing relatively well given her recent hip fracture and recent bout of low HGB. Mobilized with no more than min guard assist, however gait is still very antalgic and we limited gait distances today to prevent an increase in pain and from further encouraging compensation patterns. Left in ED stretcher with family present and all needs met. Would likely benefit from return to post-acute rehab when medically ready for DC.         If plan is discharge home, recommend the following: A little help with walking and/or transfers;Assistance with cooking/housework;Assist for transportation;A little help with bathing/dressing/bathroom;Help with stairs or ramp for entrance   Can travel by private vehicle   Yes    Equipment Recommendations None recommended by PT  Recommendations for Other Services       Functional Status Assessment Patient has had a recent decline in their functional status and demonstrates the ability to make significant improvements in function in a reasonable and predictable amount of time.     Precautions / Restrictions Precautions Precautions: Fall Recall of Precautions/Restrictions: Intact Restrictions Weight Bearing Restrictions Per Provider Order: No Other Position/Activity Restrictions:  LLE WBAT      Mobility  Bed Mobility Overal bed mobility: Needs Assistance Bed Mobility: Supine to Sit     Supine to sit: HOB elevated, Supervision     General bed mobility comments: extra time and effort but able to do so without physical assist from PT    Transfers Overall transfer level: Needs assistance Equipment used: Rolling walker (2 wheels) Transfers: Sit to/from Stand Sit to Stand: Contact guard assist           General transfer comment: min guard for safety and steadying especially given higher surface of ED stretcher, extra time    Ambulation/Gait Ambulation/Gait assistance: Contact guard assist Gait Distance (Feet): 30 Feet (x2) Assistive device: Rolling walker (2 wheels) Gait Pattern/deviations: Antalgic, Step-to pattern, Trunk flexed, Decreased stance time - left, Decreased step length - right Gait velocity: decreased     General Gait Details: hobbling a bit due to knee pain in stance phase, but able to offload OK with RW. Limited gait distances to prevent pain from increasing  Stairs            Wheelchair Mobility     Tilt Bed    Modified Rankin (Stroke Patients Only)       Balance Overall balance assessment: Needs assistance   Sitting balance-Leahy Scale: Fair     Standing balance support: Bilateral upper extremity supported Standing balance-Leahy Scale: Poor Standing balance comment: Pt needs BUE support for mobility with use of the RW.                             Pertinent Vitals/Pain Pain Assessment Pain Assessment: No/denies pain Pain Score: 0-No pain Pain Location: left knee  with ambulation Pain Descriptors / Indicators: Discomfort Pain Intervention(s): Limited activity within patient's tolerance    Home Living                          Prior Function                       Extremity/Trunk Assessment   Upper Extremity Assessment Upper Extremity Assessment: Defer to OT evaluation     Lower Extremity Assessment Lower Extremity Assessment: Generalized weakness    Cervical / Trunk Assessment Cervical / Trunk Assessment: Kyphotic  Communication   Communication Communication: No apparent difficulties    Cognition Arousal: Alert Behavior During Therapy: WFL for tasks assessed/performed                             Following commands: Intact       Cueing       General Comments      Exercises     Assessment/Plan    PT Assessment Patient needs continued PT services  PT Problem List Decreased strength;Decreased knowledge of use of DME;Decreased activity tolerance;Decreased balance;Decreased mobility       PT Treatment Interventions DME instruction;Gait training;Stair training;Patient/family education;Functional mobility training;Therapeutic activities;Therapeutic exercise;Balance training    PT Goals (Current goals can be found in the Care Plan section)  Acute Rehab PT Goals Patient Stated Goal: feel better PT Goal Formulation: With patient/family Time For Goal Achievement: 02/01/24 Potential to Achieve Goals: Good    Frequency Min 1X/week     Co-evaluation               AM-PAC PT 6 Clicks Mobility  Outcome Measure Help needed turning from your back to your side while in a flat bed without using bedrails?: A Little Help needed moving from lying on your back to sitting on the side of a flat bed without using bedrails?: A Lot Help needed moving to and from a bed to a chair (including a wheelchair)?: A Little Help needed standing up from a chair using your arms (e.g., wheelchair or bedside chair)?: A Little Help needed to walk in hospital room?: A Little Help needed climbing 3-5 steps with a railing? : A Lot 6 Click Score: 16    End of Session   Activity Tolerance: Patient tolerated treatment well Patient left: in bed;with family/visitor present;with call bell/phone within reach (ED stretcher) Nurse Communication: Mobility  status PT Visit Diagnosis: Unsteadiness on feet (R26.81);Difficulty in walking, not elsewhere classified (R26.2);Muscle weakness (generalized) (M62.81);Pain Pain - Right/Left: Left Pain - part of body: Knee    Time: 8762-8748 PT Time Calculation (min) (ACUTE ONLY): 14 min   Charges:   PT Evaluation $PT Eval Moderate Complexity: 1 Mod   PT General Charges $$ ACUTE PT VISIT: 1 Visit        Josette Rough, PT, DPT 01/18/24 2:45 PM

## 2024-01-18 NOTE — Progress Notes (Signed)
 PT eval complete, formal note pending. Recommend continued IP follow up in post acute setting <3 hours per day, no additional DME recommendations at this time   Terrel Ferries, PT, DPT 01/18/24 12:53 PM

## 2024-01-18 NOTE — Consult Note (Signed)
 Reason for Consult:Melena anemia Referring Physician: Hospital team  SHAHIDA SCHNACKENBERG is an 78 y.o. female.  HPI: Patient seen and examined and her hospital computer chart and her office computer chart was reviewed and her case discussed with her daughter and hospital team via epic chat and she has had multiple colonoscopies the last 1 a year ago but never an endoscopy and she has minimal upper tract symptoms and no abdominal pain and her stools have been black for a few days and she is on 2 regular aspirin  a day but had been on Plavix  last year but was changed to an aspirin  and they increased the dose since her hip surgery and she is on iron  and has no other complaints  Past Medical History:  Diagnosis Date   Anemia    Arthritis    Basal cell carcinoma 01/08/2014   BCC NODULAR NOSE MOHS   Cancer (HCC)    SKIN CANCERS REMOVED   Diabetes mellitus without complication (HCC)    Type II   Family history of adverse reaction to anesthesia    "mother always was deathly sick"   History of kidney stones 40 YRS AGO   Hx of skin cancer, basal cell    Hyperlipemia    Hypertension    Osteoporosis    PONV (postoperative nausea and vomiting)    RSD (reflex sympathetic dystrophy)    L ARM   Squamous cell carcinoma of skin    ABOVE LEFT BROW CX3 5FU   TIA (transient ischemic attack) 1994    Past Surgical History:  Procedure Laterality Date   ABDOMINAL HYSTERECTOMY  10/02/1993   DILATION AND CURETTAGE OF UTERUS     FEMUR IM NAIL Left 12/14/2023   Procedure: INSERTION, INTRAMEDULLARY ROD, FEMUR;  Surgeon: Adonica Hoose, MD;  Location: [facility]S;  Service: Orthopedics;  Laterality: Left;   KNEE ARTHROSCOPY     X 1 on right; X 2 on left   MOHS SURGERY     REVERSE SHOULDER ARTHROPLASTY Right 10/03/2013   Procedure: RIGHT REVERSE SHOULDER ARTHROPLASTY;  Surgeon: Lorriane Rote, MD;  Location: [facility];  Service: Orthopedics;  Laterality: Right;   REVERSE SHOULDER ARTHROPLASTY Left 07/21/2021    Procedure: REVERSE SHOULDER ARTHROPLASTY;  Surgeon: Winston Hawking, MD;  Location: [facility] OR;  Service: Orthopedics;  Laterality: Left;   ROTATOR CUFF REPAIR Right    SHOULDER ARTHROSCOPY Right    TOTAL KNEE ARTHROPLASTY Right 10/05/2014   Procedure: RIGHT TOTAL KNEE ARTHROPLASTY;  Surgeon: Bevin Bucks, MD;  Location: [facility];  Service: Orthopedics;  Laterality: Right;   TOTAL KNEE ARTHROPLASTY Left 03/09/2015   Procedure: LEFT TOTAL KNEE ARTHROPLASTY;  Surgeon: Claiborne Crew, MD;  Location: [facility];  Service: Orthopedics;  Laterality: Left;    Family History  Problem Relation Age of Onset   Breast cancer Neg Hx     Social History:  reports that she has never smoked. She has never used smokeless tobacco. She reports that she does not drink alcohol  and does not use drugs.  Allergies:  Allergies  Allergen Reactions   Motrin [Ibuprofen] Other (See Comments)    Per MD-- does not want her taking.    Shellfish Allergy Hives    Shrimp.    Medications: I have reviewed the patient's current medications.  Results for orders placed or performed during the hospital encounter of 01/17/24 (from the past 48 hours)  Type and screen [facility]     Status: None (Preliminary result)  Collection Time: 01/17/24 10:56 PM  Result Value Ref Range   ABO/RH(D) AB POS    Antibody Screen NEG    Sample Expiration 01/20/2024,2359    Unit Number [id number]    Blood Component Type RED CELLS,LR    Unit division 00    Status of Unit ISSUED    Transfusion Status OK TO TRANSFUSE    Crossmatch Result Compatible    Unit Number [id number]    Blood Component Type RBC LR PHER2    Unit division 00    Status of Unit ISSUED    Transfusion Status OK TO TRANSFUSE    Crossmatch Result      Compatible Performed at [facility], [address] [place], [place] [address]   Comprehensive metabolic panel     Status: Abnormal   Collection Time: 01/17/24 10:57 PM  Result Value Ref Range    Sodium 134 (L) 135 - 145 mmol/L   Potassium 5.2 (H) 3.5 - 5.1 mmol/L   Chloride 106 98 - 111 mmol/L   CO2 16 (L) 22 - 32 mmol/L   Glucose, Bld 133 (H) 70 - 99 mg/dL    Comment: Glucose reference range applies only to samples taken after fasting for at least 8 hours.   BUN 79 (H) 8 - 23 mg/dL   Creatinine, Ser 2.84 (H) 0.44 - 1.00 mg/dL   Calcium  9.5 8.9 - 10.3 mg/dL   Total Protein 5.9 (L) 6.5 - 8.1 g/dL   Albumin  3.2 (L) 3.5 - 5.0 g/dL   AST 20 15 - 41 U/L   ALT 16 0 - 44 U/L   Alkaline Phosphatase 75 38 - 126 U/L   Total Bilirubin 0.7 0.0 - 1.2 mg/dL   GFR, Estimated 32 (L) >60 mL/min    Comment: (NOTE) Calculated using the CKD-EPI Creatinine Equation (2021)    Anion gap 12 5 - 15    Comment: Performed at [facility], [address], [place], [place] [address]  CBC     Status: Abnormal   Collection Time: 01/17/24 10:57 PM  Result Value Ref Range   WBC 15.2 (H) 4.0 - 10.5 K/uL   RBC 1.46 (L) 3.87 - 5.11 MIL/uL   Hemoglobin 4.4 (LL) 12.0 - 15.0 g/dL    Comment: REPEATED TO VERIFY THIS CRITICAL RESULT HAS VERIFIED AND BEEN CALLED TO I. L[address] RN BY MARY ALAMANO ON 04 18 2025 AT 0010, AND HAS BEEN READ BACK.     HCT 15.3 (L) 36.0 - 46.0 %   MCV 104.8 (H) 80.0 - 100.0 fL   MCH 30.1 26.0 - 34.0 pg   MCHC 28.8 (L) 30.0 - 36.0 g/dL   RDW 01.0 (H) 27.2 - 53.6 %   Platelets 383 150 - 400 K/uL   nRBC 0.0 0.0 - 0.2 %    Comment: Performed at [facility], [address], [place], [place] [address]  Reticulocytes     Status: Abnormal   Collection Time: 01/17/24 10:57 PM  Result Value Ref Range   Retic Ct Pct 15.1 (H) 0.4 - 3.1 %   RBC. 1.45 (L) 3.87 - 5.11 MIL/uL   Retic Count, Absolute 218.8 (H) 19.0 - 186.0 K/uL   Immature Retic Fract 48.9 (H) 2.3 - 15.9 %    Comment: Performed at [facility], [address], [place], [place] [address]  Folate     Status: None   Collection Time: 01/17/24 10:57 PM  Result Value Ref Range  Folate 36.0 >5.9 ng/mL    Comment:  Performed at [facility], [address], [place], [place] [address]  POC occult blood, ED     Status: Abnormal   Collection Time: 01/18/24 12:05 AM  Result Value Ref Range   Fecal Occult Bld POSITIVE (A) NEGATIVE  Prepare RBC (crossmatch)     Status: None   Collection Time: 01/18/24 12:12 AM  Result Value Ref Range   Order Confirmation      ORDER PROCESSED BY BLOOD BANK Performed at [facility], [address] [place], [place] [address]   CBG monitoring, ED     Status: None   Collection Time: 01/18/24  5:52 AM  Result Value Ref Range   Glucose-Capillary 78 70 - 99 mg/dL    Comment: Glucose reference range applies only to samples taken after fasting for at least 8 hours.  CBC with Differential/Platelet     Status: Abnormal   Collection Time: 01/18/24  6:45 AM  Result Value Ref Range   WBC 11.2 (H) 4.0 - 10.5 K/uL   RBC 2.35 (L) 3.87 - 5.11 MIL/uL   Hemoglobin 7.1 (L) 12.0 - 15.0 g/dL    Comment: REPEATED TO VERIFY POST TRANSFUSION SPECIMEN    HCT 22.5 (L) 36.0 - 46.0 %   MCV 95.7 80.0 - 100.0 fL    Comment: POST TRANSFUSION SPECIMEN REPEATED TO VERIFY    MCH 30.2 26.0 - 34.0 pg   MCHC 31.6 30.0 - 36.0 g/dL   RDW 09.8 (H) 11.9 - 14.7 %   Platelets 319 150 - 400 K/uL   nRBC 0.0 0.0 - 0.2 %   Neutrophils Relative % 67 %   Neutro Abs 7.6 1.7 - 7.7 K/uL   Lymphocytes Relative 24 %   Lymphs Abs 2.7 0.7 - 4.0 K/uL   Monocytes Relative 6 %   Monocytes Absolute 0.7 0.1 - 1.0 K/uL   Eosinophils Relative 1 %   Eosinophils Absolute 0.1 0.0 - 0.5 K/uL   Basophils Relative 1 %   Basophils Absolute 0.1 0.0 - 0.1 K/uL   Immature Granulocytes 1 %   Abs Immature Granulocytes 0.11 (H) 0.00 - 0.07 K/uL    Comment: Performed at [facility], [address], [place], [place] [address]  Comprehensive metabolic panel with GFR     Status: Abnormal   Collection Time: 01/18/24  6:45 AM  Result Value Ref Range   Sodium 137 135 - 145 mmol/L   Potassium 5.0 3.5 - 5.1  mmol/L   Chloride 109 98 - 111 mmol/L   CO2 19 (L) 22 - 32 mmol/L   Glucose, Bld 93 70 - 99 mg/dL    Comment: Glucose reference range applies only to samples taken after fasting for at least 8 hours.   BUN 71 (H) 8 - 23 mg/dL   Creatinine, Ser 2.13 (H) 0.44 - 1.00 mg/dL   Calcium  9.1 8.9 - 10.3 mg/dL   Total Protein 5.6 (L) 6.5 - 8.1 g/dL   Albumin  3.0 (L) 3.5 - 5.0 g/dL   AST 14 (L) 15 - 41 U/L   ALT 14 0 - 44 U/L   Alkaline Phosphatase 69 38 - 126 U/L   Total Bilirubin 1.1 0.0 - 1.2 mg/dL   GFR, Estimated 35 (L) >60 mL/min    Comment: (NOTE) Calculated using the CKD-EPI Creatinine Equation (2021)    Anion gap 9 5 - 15    Comment: Performed at [facility],  [address], [place], [place] [address]  Magnesium      Status: Abnormal   Collection Time: 01/18/24  6:45 AM  Result Value Ref Range   Magnesium  2.8 (H) 1.7 - 2.4 mg/dL    Comment: Performed at [facility], [address] [place], [place] [address]  Ferritin     Status: None   Collection Time: 01/18/24  6:45 AM  Result Value Ref Range   Ferritin 63 11 - 307 ng/mL    Comment: Performed at [facility], [address], [place], [place] [address]  Iron  and TIBC     Status: Abnormal   Collection Time: 01/18/24  6:45 AM  Result Value Ref Range   Iron  35 28 - 170 ug/dL   TIBC 914 782 - 956 ug/dL   Saturation Ratios 9 (L) 10.4 - 31.8 %   UIBC 365 ug/dL    Comment: Performed at [facility], [address], [place], [place] [address]  Urinalysis, Complete w Microscopic -Urine, Clean Catch     Status: Abnormal   Collection Time: 01/18/24  6:58 AM  Result Value Ref Range   Color, Urine YELLOW YELLOW   APPearance HAZY (A) CLEAR   Specific Gravity, Urine 1.011 1.005 - 1.030   pH 5.0 5.0 - 8.0   Glucose, UA NEGATIVE NEGATIVE mg/dL   Hgb urine dipstick SMALL (A) NEGATIVE   Bilirubin Urine NEGATIVE NEGATIVE   Ketones, ur NEGATIVE NEGATIVE mg/dL   Protein, ur NEGATIVE NEGATIVE mg/dL   Nitrite  NEGATIVE NEGATIVE   Leukocytes,Ua MODERATE (A) NEGATIVE   RBC / HPF 6-10 0 - 5 RBC/hpf   WBC, UA 11-20 0 - 5 WBC/hpf   Bacteria, UA MANY (A) NONE SEEN   Squamous Epithelial / HPF 0-5 0 - 5 /HPF    Comment: Performed at [facility] [place] [facility], [address] [place], [place] [address]  CBG monitoring, ED     Status: Abnormal   Collection Time: 01/18/24 12:13 PM  Result Value Ref Range   Glucose-Capillary 115 (H) 70 - 99 mg/dL    Comment: Glucose reference range applies only to samples taken after fasting for at least 8 hours.  Hemoglobin and hematocrit, blood     Status: Abnormal   Collection Time: 01/18/24 12:30 PM  Result Value Ref Range   Hemoglobin 7.4 (L) 12.0 - 15.0 g/dL   HCT 69.6 (L) 29.5 - 28.4 %    Comment: Performed at [facility], [address], [place], [place] [address]    No results found.  Review of Systems negative except above Blood pressure (!) 137/57, pulse 98, temperature 97.8 F (36.6 C), temperature source Temporal, resp. rate 18, height 5\' 2"  (1.575 m), weight 78.9 kg, SpO2 94%. Physical Exam vital signs stable afebrile no acute distress exam please see preassessment evaluation labs reviewed BUN and creatinine a little greater than baseline hemoglobin drop from 7.7-4.4 not really iron  deficient stool was guaiac positive  Assessment/Plan: Guaiac positive drop in hemoglobin patient on aspirin  Plan: We discussed endoscopy compared it to the colonoscopy she has had and we will proceed today with further workup and plans pending those findings  Coree Brame E 01/18/2024, 2:01 PM

## 2024-01-18 NOTE — Transfer of Care (Signed)
 Immediate Anesthesia Transfer of Care Note  Patient: Alexis Hopkins  Procedure(s) Performed: EGD (ESOPHAGOGASTRODUODENOSCOPY)  Patient Location: PACU  Anesthesia Type:MAC  Level of Consciousness: awake and sedated  Airway & Oxygen Therapy: Patient Spontanous Breathing and Patient connected to nasal cannula oxygen  Post-op Assessment: Report given to RN and Post -op Vital signs reviewed and stable  Post vital signs: Reviewed and stable  Last Vitals:  Vitals Value Taken Time  BP 102/47 01/18/24 1423  Temp 36.7 C 01/18/24 1423  Pulse 89 01/18/24 1427  Resp 22 01/18/24 1427  SpO2 96 % 01/18/24 1427  Vitals shown include unfiled device data.  Last Pain:  Vitals:   01/18/24 1423  TempSrc: Temporal  PainSc: 0-No pain         Complications: No notable events documented.

## 2024-01-18 NOTE — Evaluation (Signed)
 Occupational Therapy Evaluation Patient Details Name: SUZZETTE GASPARRO MRN: [id number] DOB: Dec 07, 1945 Today's Date: 01/18/2024   History of Present Illness   AREONNA BRAN is a 78 y.o. female who is admitted to [facility] on 01/17/2024 with suspected acute upper gastrointestinal bleed after presenting from SNF to [facility] ED complaining of dark-colored stool and Low HGB opf 4.4.  Past medical history significant for recent left hip fracture status post intramedullary nail to the left femur on 12/14/2023, type 2 diabetes mellitus, hypertension, hyperlipidemia, GERD, CKD 3B with baseline creatinine 1.1-1.5, anemia of chronic disease associated baseline hemoglobin 9-12,     Clinical Impressions Pt currently at min assist overall for selfcare tasks sit to stand and toileting with use of the RW for support.  BP stable in sitting at 142/59 with oxygen sats on room air at 99% during transfers.  Prior to admission pt was at [facility] getting rehab secondary to recent left him fx.  Prior to this injury she was living with her spouse, who she helped take care of and she was independent.  Feel she will benefit from continued acute care OT to help progress ADL independence.  Based on current level and not having assist post discharge, recommend continued inpatient follow up therapy, <3 hours/day post acute stay.       If plan is discharge home, recommend the following:   A little help with walking and/or transfers;Assistance with cooking/housework;Assist for transportation;Help with stairs or ramp for entrance;A little help with bathing/dressing/bathroom     Functional Status Assessment   Patient has had a recent decline in their functional status and demonstrates the ability to make significant improvements in function in a reasonable and predictable amount of time.     Equipment Recommendations   Other (comment) (TBD next venue of care)      Precautions/Restrictions    Precautions Precautions: Fall Recall of Precautions/Restrictions: Intact Restrictions Weight Bearing Restrictions Per Provider Order: No Other Position/Activity Restrictions: LLE WBAT     Mobility Bed Mobility Overal bed mobility: Needs Assistance Bed Mobility: Supine to Sit     Supine to sit: Min assist, HOB elevated     General bed mobility comments: Assist with bringing trunk up to sitting.    Transfers Overall transfer level: Needs assistance Equipment used: Rolling walker (2 wheels) Transfers: Sit to/from Stand, Bed to chair/wheelchair/BSC Sit to Stand: Min assist     Step pivot transfers: Min assist            Balance Overall balance assessment: Needs assistance   Sitting balance-Leahy Scale: Fair     Standing balance support: Bilateral upper extremity supported Standing balance-Leahy Scale: Poor Standing balance comment: Pt needs BUE support for mobility with use of the RW.                           ADL either performed or assessed with clinical judgement   ADL Overall ADL's : Needs assistance/impaired Eating/Feeding: Independent;Sitting   Grooming: Wash/dry hands;Contact guard assist;Standing   Upper Body Bathing: Supervision/ safety;Sitting   Lower Body Bathing: Minimal assistance;Sit to/from stand   Upper Body Dressing : Supervision/safety;Sitting   Lower Body Dressing: Moderate assistance;Sit to/from stand   Toilet Transfer: Minimal Holiday representative;Ambulation;Grab bars   Toileting- Clothing Manipulation and Hygiene: Minimal assistance;Sit to/from stand       Functional mobility during ADLs: Contact guard assist;Rolling walker (2 wheels) General ADL Comments: Pt's BP in supine 154/59 and  then in sitting at 142/59.  Oxygen sats at 99% on room air with mobility and HR elevating up to 125 BPM.  Min assist needed for sit to stand from the lower toilet with min guard from the elevated stretcher.  RW utilized for transfer and  mobility.     Vision Baseline Vision/History: 0 No visual deficits Ability to See in Adequate Light: 0 Adequate Patient Visual Report: No change from baseline Vision Assessment?: No apparent visual deficits     Perception Perception: Within Functional Limits       Praxis Praxis: WFL       Pertinent Vitals/Pain Pain Assessment Pain Assessment: Faces Faces Pain Scale: Hurts a little bit Pain Location: left knee with ambulation Pain Descriptors / Indicators: Discomfort Pain Intervention(s): Limited activity within patient's tolerance     Extremity/Trunk Assessment Upper Extremity Assessment Upper Extremity Assessment: Overall WFL for tasks assessed   Lower Extremity Assessment Lower Extremity Assessment: Defer to PT evaluation   Cervical / Trunk Assessment Cervical / Trunk Assessment: Kyphotic   Communication Communication Communication: No apparent difficulties   Cognition Arousal: Alert Behavior During Therapy: WFL for tasks assessed/performed Cognition: No apparent impairments                               Following commands: Intact                  Home Living Family/patient expects to be discharged to:: (P) Skilled nursing facility                                 Additional Comments: (P) Pt was at [facility] for rehab.  Pt was independent at home prior to originally.      Prior Functioning/Environment Prior Level of Function : (P) Independent/Modified Independent;Driving             Mobility Comments: (P) Able to ambulate in community; does not use AD ADLs Comments: (P) independent adls and iadls    OT Problem List: Decreased strength;Decreased knowledge of use of DME or AE;Decreased activity tolerance;Impaired balance (sitting and/or standing);Pain   OT Treatment/Interventions: Self-care/ADL training;Balance training;Therapeutic activities;DME and/or AE instruction;Patient/family education      OT  Goals(Current goals can be found in the care plan section)   Acute Rehab OT Goals Patient Stated Goal: Pt wants to go back to rehab for another week OT Goal Formulation: With patient/family Time For Goal Achievement: 02/01/24 Potential to Achieve Goals: Fair   OT Frequency:  Min 1X/week       AM-PAC OT "6 Clicks" Daily Activity     Outcome Measure Help from another person eating meals?: None Help from another person taking care of personal grooming?: A Little Help from another person toileting, which includes using toliet, bedpan, or urinal?: A Little Help from another person bathing (including washing, rinsing, drying)?: A Little Help from another person to put on and taking off regular upper body clothing?: A Little Help from another person to put on and taking off regular lower body clothing?: A Lot 6 Click Score: 18   End of Session Equipment Utilized During Treatment: Gait belt;Rollator (4 wheels) Nurse Communication: Mobility status  Activity Tolerance: Patient tolerated treatment well Patient left: in bed;with call bell/phone within reach;with family/visitor present  OT Visit Diagnosis: Unsteadiness on feet (R26.81);Other abnormalities of gait and mobility (R26.89);Muscle weakness (generalized) (M62.81);Pain Pain -  Right/Left: Left Pain - part of body: Knee;Leg                Time: 1610-9604 OT Time Calculation (min): 42 min Charges:  OT General Charges $OT Visit: 1 Visit OT Evaluation $OT Eval Moderate Complexity: 1 Mod OT Treatments $Self Care/Home Management : 23-37 mins  Ardena Becker, OTR/L Acute Rehabilitation Services  Office [phone] 01/18/2024

## 2024-01-18 NOTE — Op Note (Signed)
 [facility] Patient Name: Alexis Hopkins Procedure Date : 01/18/2024 MRN: [id number] Attending MD: Ozell Blunt , MD, [id number] Date of Birth: 1946/05/03 CSN: [id number] Age: 78 Admit Type: Inpatient Procedure:                Upper GI endoscopy Indications:              Acute post hemorrhagic anemia, Melena Providers:                Ozell Blunt, MD, Lorenzo Romberg, RN, Kimberly Penna                            Mbumina, Technician Referring MD:              Medicines:                Monitored Anesthesia Care Complications:            No immediate complications. Estimated Blood Loss:     Estimated blood loss: none. Procedure:                Pre-Anesthesia Assessment:                           - Prior to the procedure, a History and Physical                            was performed, and patient medications and                            allergies were reviewed. The patient's tolerance of                            previous anesthesia was also reviewed. The risks                            and benefits of the procedure and the sedation                            options and risks were discussed with the patient.                            All questions were answered, and informed consent                            was obtained. Prior Anticoagulants: The patient has                            taken no anticoagulant or antiplatelet agents                            except for aspirin . ASA Grade Assessment: III - A                            patient with severe systemic disease. After  reviewing the risks and benefits, the patient was                            deemed in satisfactory condition to undergo the                            procedure.                           After obtaining informed consent, the endoscope was                            passed under direct vision. Throughout the                            procedure, the patient's blood pressure,  pulse, and                            oxygen saturations were monitored continuously. The                            GIF-H190 ([id number]) Olympus endoscope was introduced                            through the mouth, and advanced to the third part                            of duodenum. The upper GI endoscopy was                            accomplished without difficulty. The patient                            tolerated the procedure well. Scope In: Scope Out: Findings:      The larynx was normal.      A small hiatal hernia was present.      LA Grade A (one or more mucosal breaks less than 5 mm, not extending       between tops of 2 mucosal folds) esophagitis with no bleeding was found.      Few non-bleeding superficial gastric ulcers with no stigmata of bleeding       were found on the greater curvature of the stomach.      The duodenal bulb, first portion of the duodenum, second portion of the       duodenum and third portion of the duodenum were normal.      The cardia and gastric fundus were normal on retroflexion. Impression:               - Normal larynx.                           - Small hiatal hernia.                           - LA Grade A reflux esophagitis with no bleeding.                           -  Non-bleeding gastric ulcers with no stigmata of                            bleeding.                           - Normal duodenal bulb, first portion of the                            duodenum, second portion of the duodenum and third                            portion of the duodenum.                           - No specimens collected. Recommendation:           - Soft diet today.                           - Continue present medications. May change to p.o.                            Protonix  tomorrow and use twice a day for 1 month                            then decrease to once a day and can stop her at                            home Pepcid famotidine and okay to continue iron                              and if doing well with rehab decrease aspirin  to a                            81 mg once a day- Return to GI clinic in 4 weeks.                           - Telephone GI clinic if symptomatic PRN. I will be                            at [facility] long tomorrow but call me if any GI                            question or problem is needed and hopefully she                            will be able to go home soon Procedure Code(s):        --- Professional ---                           [phone], Esophagogastroduodenoscopy, flexible,  transoral; diagnostic, including collection of                            specimen(s) by brushing or washing, when performed                            (separate procedure) Diagnosis Code(s):        --- Professional ---                           K44.9, Diaphragmatic hernia without obstruction or                            gangrene                           K21.00, Gastro-esophageal reflux disease with                            esophagitis, without bleeding                           K25.9, Gastric ulcer, unspecified as acute or                            chronic, without hemorrhage or perforation                           D62, Acute posthemorrhagic anemia                           K92.1, Melena (includes Hematochezia) CPT copyright 2022 American Medical Association. All rights reserved. The codes documented in this report are preliminary and upon coder review may  be revised to meet current compliance requirements. Ozell Blunt, MD 01/18/2024 2:32:26 PM This report has been signed electronically. Number of Addenda: 0

## 2024-01-19 DIAGNOSIS — E1122 Type 2 diabetes mellitus with diabetic chronic kidney disease: Secondary | ICD-10-CM | POA: Diagnosis not present

## 2024-01-19 DIAGNOSIS — I10 Essential (primary) hypertension: Secondary | ICD-10-CM | POA: Diagnosis not present

## 2024-01-19 DIAGNOSIS — D649 Anemia, unspecified: Secondary | ICD-10-CM | POA: Diagnosis not present

## 2024-01-19 DIAGNOSIS — K922 Gastrointestinal hemorrhage, unspecified: Secondary | ICD-10-CM | POA: Diagnosis not present

## 2024-01-19 LAB — RENAL FUNCTION PANEL
Albumin: 2.8 g/dL — ABNORMAL LOW (ref 3.5–5.0)
Anion gap: 10 (ref 5–15)
BUN: 47 mg/dL — ABNORMAL HIGH (ref 8–23)
CO2: 22 mmol/L (ref 22–32)
Calcium: 8.5 mg/dL — ABNORMAL LOW (ref 8.9–10.3)
Chloride: 108 mmol/L (ref 98–111)
Creatinine, Ser: 1.71 mg/dL — ABNORMAL HIGH (ref 0.44–1.00)
GFR, Estimated: 31 mL/min — ABNORMAL LOW (ref >60)
Glucose, Bld: 121 mg/dL — ABNORMAL HIGH (ref 70–99)
Phosphorus: 4.5 mg/dL (ref 2.5–4.6)
Potassium: 4.4 mmol/L (ref 3.5–5.1)
Sodium: 140 mmol/L (ref 135–145)

## 2024-01-19 LAB — BASIC METABOLIC PANEL WITH GFR
Anion gap: 10 (ref 5–15)
BUN: 50 mg/dL — ABNORMAL HIGH (ref 8–23)
CO2: 22 mmol/L (ref 22–32)
Calcium: 8.6 mg/dL — ABNORMAL LOW (ref 8.9–10.3)
Chloride: 108 mmol/L (ref 98–111)
Creatinine, Ser: 1.66 mg/dL — ABNORMAL HIGH (ref 0.44–1.00)
GFR, Estimated: 32 mL/min — ABNORMAL LOW (ref >60)
Glucose, Bld: 122 mg/dL — ABNORMAL HIGH (ref 70–99)
Potassium: 4.4 mmol/L (ref 3.5–5.1)
Sodium: 140 mmol/L (ref 135–145)

## 2024-01-19 LAB — GLUCOSE, CAPILLARY
Glucose-Capillary: 126 mg/dL — ABNORMAL HIGH (ref 70–99)
Glucose-Capillary: 146 mg/dL — ABNORMAL HIGH (ref 70–99)
Glucose-Capillary: 166 mg/dL — ABNORMAL HIGH (ref 70–99)
Glucose-Capillary: 318 mg/dL — ABNORMAL HIGH (ref 70–99)
Glucose-Capillary: 145 mg/dL — ABNORMAL HIGH (ref 70–99)

## 2024-01-19 LAB — CBC
HCT: 22.1 % — ABNORMAL LOW (ref 36.0–46.0)
HCT: 28.2 % — ABNORMAL LOW (ref 36.0–46.0)
Hemoglobin: 7 g/dL — ABNORMAL LOW (ref 12.0–15.0)
Hemoglobin: 9.2 g/dL — ABNORMAL LOW (ref 12.0–15.0)
MCH: 30.7 pg (ref 26.0–34.0)
MCH: 30.7 pg (ref 26.0–34.0)
MCHC: 31.7 g/dL (ref 30.0–36.0)
MCHC: 32.6 g/dL (ref 30.0–36.0)
MCV: 96.9 fL (ref 80.0–100.0)
MCV: 94 fL (ref 80.0–100.0)
Platelets: 319 10*3/uL (ref 150–400)
Platelets: 340 10*3/uL (ref 150–400)
RBC: 2.28 MIL/uL — ABNORMAL LOW (ref 3.87–5.11)
RBC: 3 MIL/uL — ABNORMAL LOW (ref 3.87–5.11)
RDW: 20.4 % — ABNORMAL HIGH (ref 11.5–15.5)
RDW: 19.3 % — ABNORMAL HIGH (ref 11.5–15.5)
WBC: 9.7 10*3/uL (ref 4.0–10.5)
WBC: 9.4 10*3/uL (ref 4.0–10.5)
nRBC: 0 % (ref 0.0–0.2)
nRBC: 0.2 % (ref 0.0–0.2)

## 2024-01-19 LAB — MAGNESIUM: Magnesium: 2.5 mg/dL — ABNORMAL HIGH (ref 1.7–2.4)

## 2024-01-19 LAB — PREPARE RBC (CROSSMATCH)

## 2024-01-19 MED ORDER — SODIUM CHLORIDE 0.9% IV SOLUTION: Freq: Once | INTRAVENOUS | Status: AC

## 2024-01-19 MED ORDER — ACETAMINOPHEN 500 MG PO TABS: 1000.0000 mg | ORAL_TABLET | Freq: Three times a day (TID) | ORAL | Status: AC | PRN

## 2024-01-19 MED ORDER — INSULIN ASPART 100 UNIT/ML IJ SOLN
0.0000 [IU] | Freq: Three times a day (TID) | INTRAMUSCULAR | Status: DC
  Administered 2024-01-19: 7 [IU] via SUBCUTANEOUS
  Administered 2024-01-20: 3 [IU] via SUBCUTANEOUS
  Administered 2024-01-20: 2 [IU] via SUBCUTANEOUS
  Administered 2024-01-20: 7 [IU] via SUBCUTANEOUS
  Administered 2024-01-21: 3 [IU] via SUBCUTANEOUS
  Administered 2024-01-21: 2 [IU] via SUBCUTANEOUS

## 2024-01-19 MED ORDER — INSULIN ASPART 100 UNIT/ML IJ SOLN: 0.0000 [IU] | Freq: Every day | INTRAMUSCULAR | Status: DC

## 2024-01-19 MED ORDER — PANTOPRAZOLE SODIUM 40 MG PO TBEC
40.0000 mg | DELAYED_RELEASE_TABLET | Freq: Two times a day (BID) | ORAL | Status: DC
  Administered 2024-01-19 – 2024-01-21 (×4): 40 mg via ORAL
  Filled 2024-01-19 (×4): qty 1

## 2024-01-19 MED ORDER — ASPIRIN 81 MG PO CHEW: 81.0000 mg | CHEWABLE_TABLET | Freq: Every day | ORAL | Status: DC

## 2024-01-19 MED ORDER — PANTOPRAZOLE SODIUM 40 MG PO TBEC: DELAYED_RELEASE_TABLET | ORAL | 1 refills | Status: AC

## 2024-01-19 NOTE — TOC Initial Note (Addendum)
 Transition of Care [facility]) - Initial/Assessment Note    Patient Details  Name: Alexis Hopkins MRN: [id number] Date of Birth: January 04, 1946  Transition of Care [facility]) CM/SW Contact:    Carmon Christen, LCSWA Phone Number: 01/19/2024, 1:14 PM  Clinical Narrative:                  CSW received consult for possible SNF placement at time of discharge. CSW spoke with patient regarding PT recommendation of SNF placement at time of discharge. Patient reports PTA she comes from [facility] short term rehab. Patient expressed understanding of PT recommendation and confirmed plan is return to [facility] short term rehab when medically ready for dc. Patient request for CSW to also follow up with her daughter Bridgette Campus. CSW discussed insurance authorization process. All questions answered. No further questions reported at this time. CSW called patients daughter Bridgette Campus. Bridgette Campus confirmed plan is for patient to return back to [facility] for short term rehab.CSW LVM for [facility] with [employer]. CSW awaiting call back   No further questions reported at this time. CSW to continue to follow and assist with discharge planning needs.   Update- CSW started insurance authorization for patient. Auth ID# [id number]. Patients insurance authorization currently pending.  Expected Discharge Plan: Skilled Nursing Facility Barriers to Discharge: Continued Medical Work up   Patient Goals and CMS Choice Patient states their goals for this hospitalization and ongoing recovery are:: SNF   Choice offered to / list presented to : Patient      Expected Discharge Plan and Services In-house Referral: Clinical Social Work     Living arrangements for the past 2 months:  (came from [facility] short term rehab) Expected Discharge Date: 01/19/24                                    Prior Living Arrangements/Services Living arrangements for the past 2 months:  (came from [facility] short term  rehab) Lives with:: Spouse Patient language and need for interpreter reviewed:: Yes Do you feel safe going back to the place where you live?: No   SNF  Need for Family Participation in Patient Care: Yes (Comment) Care giver support system in place?: Yes (comment)   Criminal Activity/Legal Involvement Pertinent to Current Situation/Hospitalization: No - Comment as needed  Activities of Daily Living   ADL Screening (condition at time of admission) Independently performs ADLs?: No Does the patient have a NEW difficulty with bathing/dressing/toileting/self-feeding that is expected to last >3 days?: No Does the patient have a NEW difficulty with getting in/out of bed, walking, or climbing stairs that is expected to last >3 days?: No Does the patient have a NEW difficulty with communication that is expected to last >3 days?: No Is the patient deaf or have difficulty hearing?: No Does the patient have difficulty seeing, even when wearing glasses/contacts?: No Does the patient have difficulty concentrating, remembering, or making decisions?: No  Permission Sought/Granted Permission sought to share information with : Case Manager, Family Supports, Oceanographer granted to share information with : Yes, Verbal Permission Granted  Share Information with NAME: Bridgette Campus  Permission granted to share info w AGENCY: SNF  Permission granted to share info w Relationship: daughter  Permission granted to share info w Contact Information: Bridgette Campus (daughter) [phone]  Emotional Assessment Appearance:: Appears stated age Attitude/Demeanor/Rapport: Gracious Affect (typically observed): Calm Orientation: : Oriented to Self, Oriented to Place,  Oriented to  Time, Oriented to Situation Alcohol  / Substance Use: Not Applicable Psych Involvement: No (comment)  Admission diagnosis:  Acute upper GI bleed [K92.2] Gastrointestinal hemorrhage with melena [K92.1] Acute blood loss  anemia (ABLA) [D62] Patient Active Problem List   Diagnosis Date Noted   Acute upper GI bleed 01/18/2024   Acute on chronic anemia 01/18/2024   Generalized weakness 01/18/2024   Leukocytosis 01/18/2024   HLD (hyperlipidemia) 01/18/2024   GERD (gastroesophageal reflux disease) 01/18/2024   History of fracture of left hip 01/18/2024   CKD stage 3b, GFR 30-44 ml/min (HCC) 01/18/2024   Acute blood loss anemia (ABLA) 01/18/2024   Acute urinary retention 12/17/2023   ABLA (acute blood loss anemia) 12/15/2023   Closed left hip fracture (HCC) 12/13/2023   Essential hypertension 12/13/2023   DM2 (diabetes mellitus, type 2) (HCC) 12/13/2023   History of TIA (transient ischemic attack) 12/13/2023   H/O total shoulder replacement, left 07/21/2021   Obese 03/10/2015   S/P left TKA 03/09/2015   S/P right TKA 10/05/2014   Arthritis of shoulder region, right, degenerative 10/03/2013   PCP:  Glena Landau, MD Pharmacy:   [facility] [address], [place] - [address] [place] [address] Phone: [phone] Fax: [phone]     Social Drivers of Health (SDOH) Social History: SDOH Screenings   Food Insecurity: No Food Insecurity (01/18/2024)  Housing: Low Risk  (01/18/2024)  Transportation Needs: No Transportation Needs (01/18/2024)  Utilities: Not At Risk (01/18/2024)  Social Connections: Moderately Integrated (01/18/2024)  Tobacco Use: Low Risk  (01/18/2024)   SDOH Interventions:     Readmission Risk Interventions    12/26/2023    1:05 PM 12/14/2023   10:13 AM  Readmission Risk Prevention Plan  Post Dischage Appt  Complete  Medication Screening  Complete  Transportation Screening Complete Complete  PCP or Specialist Appt within 5-7 Days Complete   Home Care Screening Complete   Medication Review (RN CM) Complete

## 2024-01-19 NOTE — Progress Notes (Signed)
 AVS completed; copy placed at nurses station.

## 2024-01-19 NOTE — NC FL2 (Signed)
 [place]  MEDICAID FL2 LEVEL OF CARE FORM     IDENTIFICATION  Patient Name: Alexis Hopkins Birthdate: [birth year] Sex: female Admission Date (Current Location): 01/17/2024  [facility] and [place] Number:  Producer, television/film/video and Address:  [facility], [address], [place], [place] [address]      Provider Number: [id number]  Attending Physician Name and Address:  Theadore Finger, MD  Relative Name and Phone Number:  Bridgette Campus (daughter) [phone]    Current Level of Care: Hospital Recommended Level of Care: Skilled Nursing Facility Prior Approval Number:    Date Approved/Denied:   PASRR Number: [id number]  Discharge Plan: SNF    Current Diagnoses: Patient Active Problem List   Diagnosis Date Noted   Acute upper GI bleed 01/18/2024   Acute on chronic anemia 01/18/2024   Generalized weakness 01/18/2024   Leukocytosis 01/18/2024   HLD (hyperlipidemia) 01/18/2024   GERD (gastroesophageal reflux disease) 01/18/2024   History of fracture of left hip 01/18/2024   CKD stage 3b, GFR 30-44 ml/min (HCC) 01/18/2024   Acute blood loss anemia (ABLA) 01/18/2024   Acute urinary retention 12/17/2023   ABLA (acute blood loss anemia) 12/15/2023   Closed left hip fracture (HCC) 12/13/2023   Essential hypertension 12/13/2023   DM2 (diabetes mellitus, type 2) (HCC) 12/13/2023   History of TIA (transient ischemic attack) 12/13/2023   H/O total shoulder replacement, left 07/21/2021   Obese 03/10/2015   S/P left TKA 03/09/2015   S/P right TKA 10/05/2014   Arthritis of shoulder region, right, degenerative 10/03/2013    Orientation RESPIRATION BLADDER Height & Weight     Self, Time, Situation, Place  Normal Continent Weight: 167 lb 1.7 oz (75.8 kg) Height:  5\' 2"  (157.5 cm)  BEHAVIORAL SYMPTOMS/MOOD NEUROLOGICAL BOWEL NUTRITION STATUS      Continent Diet (Please see discharge summary)  AMBULATORY STATUS COMMUNICATION OF NEEDS Skin   Limited Assist  Verbally Other (Comment) (Erythema,Buttocks,Bil.,Wound/Incision LDAs)                       Personal Care Assistance Level of Assistance  Bathing, Feeding, Dressing Bathing Assistance: Limited assistance Feeding assistance: Independent Dressing Assistance: Limited assistance     Functional Limitations Info  Sight, Hearing, Speech Sight Info: Adequate Hearing Info: Adequate Speech Info: Adequate    SPECIAL CARE FACTORS FREQUENCY  PT (By licensed PT), OT (By licensed OT)     PT Frequency: 5x min weekly OT Frequency: 5x min weekly            Contractures Contractures Info: Not present    Additional Factors Info  Code Status, Allergies, Insulin  Sliding Scale Code Status Info: FULL Allergies Info: Motrin (ibuprofen),Shellfish Allergy   Insulin  Sliding Scale Info: insulin  aspart (novoLOG ) injection 0-6 Units every 6 hours       Current Medications (01/19/2024):  This is the current hospital active medication list Current Facility-Administered Medications  Medication Dose Route Frequency Provider Last Rate Last Admin   0.9 %  sodium chloride  infusion (Manually program via Guardrails IV Fluids)   Intravenous Once Ozell Blunt, MD       acetaminophen  (TYLENOL ) tablet 650 mg  650 mg Oral Q6H PRN Ozell Blunt, MD       Or   acetaminophen  (TYLENOL ) suppository 650 mg  650 mg Rectal Q6H PRN Ozell Blunt, MD       amLODipine  (NORVASC ) tablet 5 mg  5 mg Oral Daily Ozell Blunt, MD   5  mg at 01/19/24 0901   fentaNYL  (SUBLIMAZE ) injection 12.5 mcg  12.5 mcg Intravenous Q2H PRN Ozell Blunt, MD       hydrALAZINE  (APRESOLINE ) tablet 25 mg  25 mg Oral Q6H PRN Ozell Blunt, MD       insulin  aspart (novoLOG ) injection 0-6 Units  0-6 Units Subcutaneous Q6H Ozell Blunt, MD   1 Units at 01/19/24 1258   naloxone  (NARCAN ) injection 0.4 mg  0.4 mg Intravenous PRN Ozell Blunt, MD       ondansetron  (ZOFRAN ) injection 4 mg  4 mg Intravenous Q6H PRN Ozell Blunt, MD       pantoprazole  (PROTONIX )  injection 40 mg  40 mg Intravenous Q12H Ozell Blunt, MD   40 mg at 01/19/24 0902     Discharge Medications: Please see discharge summary for a list of discharge medications.  Relevant Imaging Results:  Relevant Lab Results:   Additional Information [id number]  Carmon Christen, LCSWA

## 2024-01-19 NOTE — Plan of Care (Signed)
 Pt ambulating ad lib around in room with walker

## 2024-01-19 NOTE — Discharge Summary (Incomplete)
 Physician Discharge Summary  MURREL FREET UVO:[id number] DOB: [birth year] DOA: 01/17/2024  PCP: Glena Landau, MD  Admit date: 01/17/2024 Discharge date: 01/19/24  Admitted From: SNF Disposition: SNF Recommendations for Outpatient Follow-up:  Follow-up with orthopedic surgery as previously planned Check CMP and CBC in 1 week May benefit from iron  infusion outpatient. Reassess blood pressure and adjust meds as appropriate Encourage ambulation Please follow up on the following pending results: None  Discharge Condition: Stable CODE STATUS: Full code   Hospital course 78 year old F with PMH of recent left hip fracture for which she had IM, CKD-3B, DM-2, HTN, HLD, anemia and GERD sent to ED from SNF due to melanotic stool and generalized weakness, and admitted with symptomatic acute on chronic blood loss anemia due to suspected upper GI bleed.  Patient was on aspirin  81 mg twice daily since she had hip surgery last month.   In ED, stable vitals.  BUN 79.  Cr 1.68 (was 1.5 on 3/26).  Hgb 4.4 (was 7.9 on discharge last month, and reportedly 9.0 at SNF).  Hemoccult positive.  Two units of blood ordered.  Started on IV Protonix .  [facility] consulted.  The next day, hemoglobin improved to 7.1 after 2 units.  Anemia panel with iron  deficiency.  She received IV Venofer  300 mg x 1.  EGD showed large grade a reflux esophagitis without bleeding, nonbleeding gastric ulcer with no stigmata of bleeding, normal duodenum.  GI recommended p.o. Protonix  40 mg twice daily for 1 month followed by once daily and decreasing aspirin  to 81 mg once daily if doing well with rehab.   On the day of discharge, hemoglobin remained stable at 7.0.  She was transfused additional 1 unit.  Hemoglobin improved to***.  She may benefit from periodic iron  infusion outpatient.  Recheck CBC in 1 to 2 weeks.  Note that, patient's blood pressure remained stable without home lisinopril  and HCTZ.  Given her renal function, we  have discontinued lisinopril  and HCTZ to allow renal recovery.  Amlodipine  increased to 10 mg daily.  Reassess blood pressure and renal function and adjust meds as appropriate.   See individual problem list below for more.   Problems addressed during this hospitalization Symptomatic iron  deficiency anemia due to acute on chronic blood loss from upper GI bleed: Presents with melena and generalized weakness.  Hemoccult positive.  Patient was aspirin  81 mg twice daily after recent hip surgery.  Anemia panel with iron  deficiency.  She has significant azotemia.  EGD as above.  Received 3 units of blood and hemoglobin improved to***.  Also received IV Venofer  300 mg x 1. -GI recommended p.o. Protonix  40 mg twice daily for 1 months followed by once daily -Decreased aspirin  to 81 mg daily.  Her hip repair was on 12/14/2023. -Recheck CBC in 1 week.  - May benefit from periodic iron  infusion outpatient    NIDDM-2 with CKD-3B: A1c 6.7% on 3/13.  On glimepiride  and metformin  at home - Consider decreasing metformin  outpatient -Continue home glimepiride .   CKD-3B: Cr ranges from 1.6-1.7, was 1.5 on 3/26.  Could be due to to anemia, lisinopril  and HCTZ.  Discontinue lisinopril  and HCTZ to allow renal recovery.  Recheck in 1 week  Essential hypertension: Normotensive off from lisinopril  and HCTZ. -Discontinue lisinopril  and HCTZ -Continue home amlodipine    Left lower extremity edema: Chronic for patient.  Patient and patient's son reports that she had lower extremity venous Doppler at SNF that was negative for DVT.  No calf tenderness  on exam.   Recent right hip fracture s/p IM nailing: Recall wound healed.  Staples out. - Outpatient follow-up with orthopedic surgery - PT/OT-recommended return to SNF   Generalized weakness: Likely due to anemia -Treat anemia as above - PT/OT-recommended return to SNF   Leukocytosis/bandemia: Likely demargination. Resolved without antibiotics   Hyperkalemia:  Resolved.            Time spent 35 minutes  Vital signs Vitals:   01/19/24 0900 01/19/24 0920 01/19/24 0955 01/19/24 1128  BP: (!) 143/49 (!) 126/53 (!) 128/57 135/62  Pulse: (!) 103   86  Temp: 97.8 F (36.6 C) 97.7 F (36.5 C) 98.4 F (36.9 C) 98.8 F (37.1 C)  Resp: 15 16 17 14   Height:      Weight:      SpO2: 100% 100% 98% 98%  TempSrc: Oral Oral Oral Oral  BMI (Calculated):         Discharge exam  GENERAL: No apparent distress.  Nontoxic. HEENT: MMM.  Vision and hearing grossly intact.  NECK: Supple.  No apparent JVD.  RESP:  No IWOB.  Fair aeration bilaterally. CVS:  RRR. Heart sounds normal.  ABD/GI/GU: BS+. Abd soft, NTND.  MSK/EXT:  Moves extremities. No apparent deformity.  Trace LLE edema. SKIN: no apparent skin lesion or wound NEURO: Awake and alert. Oriented appropriately.  No apparent focal neuro deficit. PSYCH: Calm. Normal affect.   Discharge Instructions Discharge Instructions     Diet - low sodium heart healthy   Complete by: As directed    Diet Carb Modified   Complete by: As directed    Increase activity slowly   Complete by: As directed       Allergies as of 01/19/2024       Reactions   Motrin [ibuprofen] Other (See Comments)   Per MD-- does not want her taking.    Shellfish Allergy Hives   Shrimp.        Medication List     STOP taking these medications    b complex vitamins capsule   famotidine 20 MG tablet Commonly known as: PEPCID   hydrochlorothiazide  12.5 MG tablet Commonly known as: HYDRODIURIL    lisinopril  40 MG tablet Commonly known as: ZESTRIL        TAKE these medications    acetaminophen  500 MG tablet Commonly known as: TYLENOL  Take 2 tablets (1,000 mg total) by mouth every 8 (eight) hours as needed for mild pain (pain score 1-3) or headache. What changed:  when to take this reasons to take this   amLODipine  10 MG tablet Commonly known as: NORVASC  Take 10 mg by mouth every morning.    amoxicillin  500 MG capsule Commonly known as: AMOXIL  Take 2,000 mg by mouth See admin instructions. Take 4 capsules (2000 mg) by mouth 1 hour prior to dental appointments   aspirin  81 MG chewable tablet Commonly known as: Aspirin  Childrens Chew 1 tablet (81 mg total) by mouth daily for 11 days. What changed: when to take this   calcium -vitamin D  500-200 MG-UNIT tablet Commonly known as: OSCAL WITH D Take 1 tablet by mouth every morning.   colesevelam  625 MG tablet Commonly known as: WELCHOL  Take 1,250 mg by mouth 2 (two) times daily with a meal.   docusate sodium  100 MG capsule Commonly known as: COLACE Take 200 mg by mouth daily.   ferrous sulfate  325 (65 FE) MG tablet Take 1 tablet (325 mg total) by mouth daily with breakfast.   glimepiride  2 MG  tablet Commonly known as: AMARYL  Take 1 mg by mouth 2 (two) times daily.   HYDROcodone -acetaminophen  5-325 MG tablet Commonly known as: NORCO/VICODIN Take 1 tablet by mouth every 6 (six) hours as needed for moderate pain (pain score 4-6) or severe pain (pain score 7-10).   metFORMIN  1000 MG tablet Commonly known as: GLUCOPHAGE  Take 1,000 mg by mouth 2 (two) times daily with a meal.   methocarbamol  500 MG tablet Commonly known as: ROBAXIN  Take 1 tablet (500 mg total) by mouth 3 (three) times daily as needed for muscle spasms.   multivitamin with minerals Tabs tablet Take 1 tablet by mouth every morning.   ondansetron  4 MG disintegrating tablet Commonly known as: ZOFRAN -ODT Take 4 mg by mouth every 6 (six) hours as needed for nausea.   OSTEO BI-FLEX TRIPLE STRENGTH PO Take 1 tablet by mouth 2 (two) times daily.   OVER THE COUNTER MEDICATION Place 1 application into both eyes at bedtime. Pure and gentle lubricant eye pad   pantoprazole  40 MG tablet Commonly known as: Protonix  Take 1 tablet (40 mg total) by mouth 2 (two) times daily for 30 days, THEN 1 tablet (40 mg total) daily. Start taking on: January 19, 2024    polyethylene glycol 17 g packet Commonly known as: MIRALAX  / GLYCOLAX  Take 17 g by mouth every morning.   pravastatin  40 MG tablet Commonly known as: PRAVACHOL  Take 40 mg by mouth daily.   Refresh 1.4-0.6 % Soln Generic drug: Polyvinyl Alcohol -Povidone PF Place 1 drop into both eyes daily as needed (for dry eye).   sodium chloride  0.65 % Soln nasal spray Commonly known as: OCEAN Place 1-2 sprays into both nostrils daily as needed (Dry nose).        Consultations: Gastroenterology  Procedures/Studies: EGD showed large grade a reflux esophagitis without bleeding, nonbleeding gastric ulcer with no stigmata of bleeding, normal duodenum.   DG Knee Complete 4 Views Left Result Date: 12/24/2023 CLINICAL DATA:  Knee pain EXAM: LEFT KNEE - COMPLETE 4+ VIEW COMPARISON:  12/13/2023 FINDINGS: Prior left total knee arthroplasty. No periprosthetic lucency or fracture. No knee joint effusion. New intramedullary rod and distal interlocking screw within the femur. No focal soft tissue swelling about the knee. IMPRESSION: Prior left total knee arthroplasty without evidence of hardware complication. Electronically Signed   By: Leverne Reading D.O.   On: 12/24/2023 13:48   DG HIP UNILAT WITH PELVIS 1V LEFT Result Date: 12/24/2023 CLINICAL DATA:  [id number] Knee pain 125018 Leg pain. EXAM: DG HIP (WITH OR WITHOUT PELVIS) 1V*L* COMPARISON:  Left femur radiographs 12/14/2023. FINDINGS: AP and cross-table lateral views of the proximal left femur. The distal left femur is imaged on the knee radiographs, dictated separately. Intact hardware status post left femoral intramedullary nail and dynamic screw fixation of a comminuted subtrochanteric femur fracture. The visualized hardware is intact without loosening. The alignment of the fracture is stable. Lateral skin staples are in place without suspicious soft tissue findings. IMPRESSION: Stable alignment of comminuted subtrochanteric left femur fracture status  post ORIF. No evidence of hardware complication. See separate examination of the left knee. Electronically Signed   By: Elmon Hagedorn M.D.   On: 12/24/2023 13:48       The results of significant diagnostics from this hospitalization (including imaging, microbiology, ancillary and laboratory) are listed below for reference.     Microbiology: No results found for this or any previous visit (from the past 240 hours).   Labs:  CBC: Recent Labs  Lab 01/17/24  2257 01/18/24 0645 01/18/24 1230 01/18/24 1821 01/19/24 0307  WBC 15.2* 11.2*  --   --  9.7  NEUTROABS  --  7.6  --   --   --   HGB 4.4* 7.1* 7.4* 8.3* 7.0*  HCT 15.3* 22.5* 23.3* 25.8* 22.1*  MCV 104.8* 95.7  --   --  96.9  PLT 383 319  --   --  319   BMP &GFR Recent Labs  Lab 01/17/24 2257 01/18/24 0645 01/19/24 0307 01/19/24 0308  NA 134* 137 140 140  K 5.2* 5.0 4.4 4.4  CL 106 109 108 108  CO2 16* 19* 22 22  GLUCOSE 133* 93 122* 121*  BUN 79* 71* 50* 47*  CREATININE 1.65* 1.54* 1.66* 1.71*  CALCIUM  9.5 9.1 8.6* 8.5*  MG  --  2.8* 2.5*  --   PHOS  --   --   --  4.5   Estimated Creatinine Clearance: 26.7 mL/min (A) (by C-G formula based on SCr of 1.71 mg/dL (H)). Liver & Pancreas: Recent Labs  Lab 01/17/24 2257 01/18/24 0645 01/19/24 0308  AST 20 14*  --   ALT 16 14  --   ALKPHOS 75 69  --   BILITOT 0.7 1.1  --   PROT 5.9* 5.6*  --   ALBUMIN  3.2* 3.0* 2.8*   No results for input(s): "LIPASE", "AMYLASE" in the last 168 hours. No results for input(s): "AMMONIA" in the last 168 hours. Diabetic: No results for input(s): "HGBA1C" in the last 72 hours. Recent Labs  Lab 01/18/24 1213 01/18/24 1802 01/19/24 0113 01/19/24 0601 01/19/24 1204  GLUCAP 115* 115* 126* 146* 166*   Cardiac Enzymes: No results for input(s): "CKTOTAL", "CKMB", "CKMBINDEX", "TROPONINI" in the last 168 hours. No results for input(s): "PROBNP" in the last 8760 hours. Coagulation Profile: Recent Labs  Lab 01/18/24 1821   INR 1.1   Thyroid  Function Tests: No results for input(s): "TSH", "T4TOTAL", "FREET4", "T3FREE", "THYROIDAB" in the last 72 hours. Lipid Profile: No results for input(s): "CHOL", "HDL", "LDLCALC", "TRIG", "CHOLHDL", "LDLDIRECT" in the last 72 hours. Anemia Panel: Recent Labs    01/17/24 2257 01/18/24 0645 01/18/24 1821  VITAMINB12  --   --  592  FOLATE 36.0  --   --   FERRITIN  --  63  --   TIBC  --  400  --   IRON   --  35  --   RETICCTPCT 15.1*  --   --    Urine analysis:    Component Value Date/Time   COLORURINE YELLOW 01/18/2024 0658   APPEARANCEUR HAZY (A) 01/18/2024 0658   LABSPEC 1.011 01/18/2024 0658   PHURINE 5.0 01/18/2024 0658   GLUCOSEU NEGATIVE 01/18/2024 0658   HGBUR SMALL (A) 01/18/2024 0658   BILIRUBINUR NEGATIVE 01/18/2024 0658   KETONESUR NEGATIVE 01/18/2024 0658   PROTEINUR NEGATIVE 01/18/2024 0658   UROBILINOGEN 0.2 03/04/2015 1003   NITRITE NEGATIVE 01/18/2024 0658   LEUKOCYTESUR MODERATE (A) 01/18/2024 0658   Sepsis Labs: Invalid input(s): "PROCALCITONIN", "LACTICIDVEN"   SIGNED:  Theadore Finger, MD  [facility] 01/19/2024, 12:45 PM

## 2024-01-19 NOTE — Plan of Care (Signed)
   Problem: Fluid Volume: Goal: Ability to maintain a balanced intake and output will improve Outcome: Progressing   Problem: Metabolic: Goal: Ability to maintain appropriate glucose levels will improve Outcome: Progressing   Problem: Nutritional: Goal: Maintenance of adequate nutrition will improve Outcome: Progressing

## 2024-01-19 NOTE — Progress Notes (Signed)
 PROGRESS NOTE  Alexis Hopkins WJX:[id number] DOB: 09-Feb-1946   PCP: Glena Landau, MD  Patient is from: SNF  DOA: 01/17/2024 LOS: 1  Chief complaints Chief Complaint  Patient presents with   Low Hgb 4.4     Brief Narrative / Interim history: 78 year old F with PMH of recent left hip fracture for which she had IM, CKD-3B, DM-2, HTN, HLD, anemia and GERD sent to ED from SNF due to melanotic stool and generalized weakness, and admitted with symptomatic acute on chronic blood loss anemia due to suspected upper GI bleed.  Patient was on aspirin  81 mg twice daily since she had hip surgery last month.  In ED, stable vitals.  BUN 79.  Cr 1.68 (was 1.5 on 3/26).  Hgb 4.4 (was 7.9 on discharge last month, and reportedly 9.0 at SNF).  Hemoccult positive.  Two units of blood ordered.  Started on IV Protonix .  [facility], Dr. Feliberto Hopping consulted.   The next day, hemoglobin improved to 7.1 after 2 units.  Anemia panel with iron  deficiency.  She received IV Venofer  300 mg x 1.  EGD showed large grade a reflux esophagitis without bleeding, nonbleeding gastric ulcer with no stigmata of bleeding, normal duodenum.  GI recommended p.o. Protonix  40 mg twice daily for 1 month followed by once daily and decreasing aspirin  to 81 mg once daily if doing well with rehab.   Subjective: Seen and examined earlier this morning.  No major events overnight of this morning.  No complaints.  Tolerated soft diet.  Hemoglobin remained stable at 7.0.  Objective: Vitals:   01/19/24 0900 01/19/24 0920 01/19/24 0955 01/19/24 1128  BP: (!) 143/49 (!) 126/53 (!) 128/57 135/62  Pulse: (!) 103   86  Resp: 15 16 17 14   Temp: 97.8 F (36.6 C) 97.7 F (36.5 C) 98.4 F (36.9 C) 98.8 F (37.1 C)  TempSrc: Oral Oral Oral Oral  SpO2: 100% 100% 98% 98%  Weight:      Height:        Examination:  GENERAL: No apparent distress.  Nontoxic. HEENT: MMM.  Vision and hearing grossly intact.  NECK: Supple.  No apparent JVD.  RESP:   No IWOB.  Fair aeration bilaterally. CVS:  RRR. Heart sounds normal.  ABD/GI/GU: BS+. Abd soft, NTND.  MSK/EXT:  Moves extremities. No apparent deformity.  LLE edema. SKIN: no apparent skin lesion or wound NEURO: Awake, alert and oriented appropriately.  No apparent focal neuro deficit. PSYCH: Calm. Normal affect.   Consultants:  Gastroenterology  Procedures: None  Microbiology summarized: None  Assessment and plan: Symptomatic iron  deficiency anemia due to acute on chronic blood loss from upper GI bleed: Presents with melena and generalized weakness.  Hemoccult positive.  Patient was aspirin  81 mg twice daily after recent hip surgery.  Anemia panel with iron  deficiency.  Received 3 units of PRBC and 300 mg of IV Venofer .  Hgb improved. Recent Labs    12/23/23 0319 12/24/23 0327 12/25/23 0331 12/26/23 0336 01/17/24 2257 01/18/24 0645 01/18/24 1230 01/18/24 1821 01/19/24 0307 01/19/24 1306  HGB 7.5* 8.0* 7.7* 7.9* 4.4* 7.1* 7.4* 8.3* 7.0* 9.2*  -Monitor H&H and transfuse for Hgb less than 7.0 -GI recommended p.o. Protonix  40 mg twice daily followed by daily -Decrease aspirin  to 81 mg daily -Continue soft diet  NIDDM-2 with CKD-3B: A1c 6.7% on 3/13.  On glimepiride  and metformin  at home Recent Labs  Lab 01/18/24 1213 01/18/24 1802 01/19/24 0113 01/19/24 0601 01/19/24 1204  GLUCAP 115* 115*  126* 146* 166*  -Change SSI to sensitive ACHS -Continue holding home meds.  CKD-3B: Cr ranges from 1.6-1.7, was 1.5 on 3/26.  Could be due to to anemia, lisinopril  and HCTZ.  Relatively stable. Recent Labs    12/15/23 0625 12/16/23 0523 12/17/23 0446 12/18/23 0518 12/19/23 0341 12/26/23 0336 01/17/24 2257 01/18/24 0645 01/19/24 0307 01/19/24 0308  BUN 46* 37* 32* 36* 36* 46* 79* 71* 50* 47*  CREATININE 1.76* 1.22* 1.09* 1.19* 1.00 1.49* 1.65* 1.54* 1.66* 1.71*  -Continue holding lisinopril  and HCTZ to allow renal recovery -Treat anemia as above -Continue  monitoring  Essential hypertension: BP slightly elevated - Continue amlodipine  - P.o. hydralazine  as needed - Continue holding lisinopril  and HCTZ  Left lower extremity edema: Chronic for patient.  Patient and patient's son reports that she had lower extremity venous Doppler at SNF that was negative for DVT.  No calf tenderness on exam.  Recent right hip fracture s/p IM nailing: Recall wound healed.  Staples out. - Outpatient follow-up with orthopedic surgery - PT/OT-recommended SNF  Generalized weakness: Likely due to anemia - Treat anemia as above - PT/OT-recommended SNF  Leukocytosis/bandemia: Likely demargination.  Resolved without antibiotics.  Hyperkalemia: Resolved.   Body mass index is 30.56 kg/m.          DVT prophylaxis:  SCDs Start: 01/18/24 0114  Code Status: Full code Family Communication: Updated patient's daughter, Bridgette Campus over the phone Level of care: Telemetry Medical Status is: Inpatient The patient will remain inpatient because: SNF bed and insurance authorization   Final disposition: Back to SNF   55 minutes with more than 50% spent in reviewing records, counseling patient/family and coordinating care.   Sch Meds:  Scheduled Meds:  sodium chloride    Intravenous Once   amLODipine   5 mg Oral Daily   insulin  aspart  0-6 Units Subcutaneous Q6H   pantoprazole  (PROTONIX ) IV  40 mg Intravenous Q12H   Continuous Infusions:   PRN Meds:.acetaminophen  **OR** acetaminophen , fentaNYL  (SUBLIMAZE ) injection, hydrALAZINE , naLOXone  (NARCAN )  injection, ondansetron  (ZOFRAN ) IV  Antimicrobials: Anti-infectives (From admission, onward)    None        I have personally reviewed the following labs and images: CBC: Recent Labs  Lab 01/17/24 2257 01/18/24 0645 01/18/24 1230 01/18/24 1821 01/19/24 0307 01/19/24 1306  WBC 15.2* 11.2*  --   --  9.7 9.4  NEUTROABS  --  7.6  --   --   --   --   HGB 4.4* 7.1* 7.4* 8.3* 7.0* 9.2*  HCT 15.3* 22.5*  23.3* 25.8* 22.1* 28.2*  MCV 104.8* 95.7  --   --  96.9 94.0  PLT 383 319  --   --  319 340   BMP &GFR Recent Labs  Lab 01/17/24 2257 01/18/24 0645 01/19/24 0307 01/19/24 0308  NA 134* 137 140 140  K 5.2* 5.0 4.4 4.4  CL 106 109 108 108  CO2 16* 19* 22 22  GLUCOSE 133* 93 122* 121*  BUN 79* 71* 50* 47*  CREATININE 1.65* 1.54* 1.66* 1.71*  CALCIUM  9.5 9.1 8.6* 8.5*  MG  --  2.8* 2.5*  --   PHOS  --   --   --  4.5   Estimated Creatinine Clearance: 26.7 mL/min (A) (by C-G formula based on SCr of 1.71 mg/dL (H)). Liver & Pancreas: Recent Labs  Lab 01/17/24 2257 01/18/24 0645 01/19/24 0308  AST 20 14*  --   ALT 16 14  --   ALKPHOS 75 69  --  BILITOT 0.7 1.1  --   PROT 5.9* 5.6*  --   ALBUMIN  3.2* 3.0* 2.8*   No results for input(s): "LIPASE", "AMYLASE" in the last 168 hours. No results for input(s): "AMMONIA" in the last 168 hours. Diabetic: No results for input(s): "HGBA1C" in the last 72 hours. Recent Labs  Lab 01/18/24 1213 01/18/24 1802 01/19/24 0113 01/19/24 0601 01/19/24 1204  GLUCAP 115* 115* 126* 146* 166*   Cardiac Enzymes: No results for input(s): "CKTOTAL", "CKMB", "CKMBINDEX", "TROPONINI" in the last 168 hours. No results for input(s): "PROBNP" in the last 8760 hours. Coagulation Profile: Recent Labs  Lab 01/18/24 1821  INR 1.1   Thyroid  Function Tests: No results for input(s): "TSH", "T4TOTAL", "FREET4", "T3FREE", "THYROIDAB" in the last 72 hours. Lipid Profile: No results for input(s): "CHOL", "HDL", "LDLCALC", "TRIG", "CHOLHDL", "LDLDIRECT" in the last 72 hours. Anemia Panel: Recent Labs    01/17/24 2257 01/18/24 0645 01/18/24 1821  VITAMINB12  --   --  592  FOLATE 36.0  --   --   FERRITIN  --  63  --   TIBC  --  400  --   IRON   --  35  --   RETICCTPCT 15.1*  --   --    Urine analysis:    Component Value Date/Time   COLORURINE YELLOW 01/18/2024 0658   APPEARANCEUR HAZY (A) 01/18/2024 0658   LABSPEC 1.011 01/18/2024 0658    PHURINE 5.0 01/18/2024 0658   GLUCOSEU NEGATIVE 01/18/2024 0658   HGBUR SMALL (A) 01/18/2024 0658   BILIRUBINUR NEGATIVE 01/18/2024 0658   KETONESUR NEGATIVE 01/18/2024 0658   PROTEINUR NEGATIVE 01/18/2024 0658   UROBILINOGEN 0.2 03/04/2015 1003   NITRITE NEGATIVE 01/18/2024 0658   LEUKOCYTESUR MODERATE (A) 01/18/2024 0658   Sepsis Labs: Invalid input(s): "PROCALCITONIN", "LACTICIDVEN"  Microbiology: No results found for this or any previous visit (from the past 240 hours).  Radiology Studies: No results found.    Simar Pothier T. Jazzlynn Rawe [facility]  If 7PM-7AM, please contact night-coverage [email or web address] 01/19/2024, 3:14 PM

## 2024-01-20 DIAGNOSIS — K922 Gastrointestinal hemorrhage, unspecified: Secondary | ICD-10-CM | POA: Diagnosis not present

## 2024-01-20 DIAGNOSIS — D649 Anemia, unspecified: Secondary | ICD-10-CM | POA: Diagnosis not present

## 2024-01-20 DIAGNOSIS — E1122 Type 2 diabetes mellitus with diabetic chronic kidney disease: Secondary | ICD-10-CM | POA: Diagnosis not present

## 2024-01-20 DIAGNOSIS — I10 Essential (primary) hypertension: Secondary | ICD-10-CM | POA: Diagnosis not present

## 2024-01-20 LAB — RENAL FUNCTION PANEL
Albumin: 3 g/dL — ABNORMAL LOW (ref 3.5–5.0)
Anion gap: 10 (ref 5–15)
BUN: 30 mg/dL — ABNORMAL HIGH (ref 8–23)
CO2: 23 mmol/L (ref 22–32)
Calcium: 8.8 mg/dL — ABNORMAL LOW (ref 8.9–10.3)
Chloride: 107 mmol/L (ref 98–111)
Creatinine, Ser: 1.3 mg/dL — ABNORMAL HIGH (ref 0.44–1.00)
GFR, Estimated: 43 mL/min — ABNORMAL LOW (ref >60)
Glucose, Bld: 146 mg/dL — ABNORMAL HIGH (ref 70–99)
Phosphorus: 4 mg/dL (ref 2.5–4.6)
Potassium: 3.9 mmol/L (ref 3.5–5.1)
Sodium: 140 mmol/L (ref 135–145)

## 2024-01-20 LAB — TYPE AND SCREEN
ABO/RH(D): AB POS
Antibody Screen: NEGATIVE
Unit division: 0
Unit division: 0
Unit division: 0

## 2024-01-20 LAB — CBC
HCT: 26.3 % — ABNORMAL LOW (ref 36.0–46.0)
Hemoglobin: 8.3 g/dL — ABNORMAL LOW (ref 12.0–15.0)
MCH: 30.2 pg (ref 26.0–34.0)
MCHC: 31.6 g/dL (ref 30.0–36.0)
MCV: 95.6 fL (ref 80.0–100.0)
Platelets: 315 10*3/uL (ref 150–400)
RBC: 2.75 MIL/uL — ABNORMAL LOW (ref 3.87–5.11)
RDW: 19.4 % — ABNORMAL HIGH (ref 11.5–15.5)
WBC: 8.4 10*3/uL (ref 4.0–10.5)
nRBC: 0.2 % (ref 0.0–0.2)

## 2024-01-20 LAB — BPAM RBC
Blood Product Expiration Date: 202504192359
Blood Product Expiration Date: 202505082359
Blood Product Expiration Date: 202505122359
ISSUE DATE / TIME: 202504180024
ISSUE DATE / TIME: 202504180225
ISSUE DATE / TIME: 202504190929
Unit Type and Rh: 6200
Unit Type and Rh: 6200
Unit Type and Rh: 8400

## 2024-01-20 LAB — GLUCOSE, CAPILLARY
Glucose-Capillary: 165 mg/dL — ABNORMAL HIGH (ref 70–99)
Glucose-Capillary: 206 mg/dL — ABNORMAL HIGH (ref 70–99)
Glucose-Capillary: 322 mg/dL — ABNORMAL HIGH (ref 70–99)
Glucose-Capillary: 126 mg/dL — ABNORMAL HIGH (ref 70–99)

## 2024-01-20 LAB — MAGNESIUM: Magnesium: 2.1 mg/dL (ref 1.7–2.4)

## 2024-01-20 LAB — HEMOGLOBIN AND HEMATOCRIT, BLOOD
HCT: 28.9 % — ABNORMAL LOW (ref 36.0–46.0)
Hemoglobin: 9 g/dL — ABNORMAL LOW (ref 12.0–15.0)

## 2024-01-20 MED ORDER — INSULIN GLARGINE-YFGN 100 UNIT/ML ~~LOC~~ SOLN
6.0000 [IU] | Freq: Every day | SUBCUTANEOUS | Status: DC
  Administered 2024-01-20 – 2024-01-21 (×2): 6 [IU] via SUBCUTANEOUS
  Filled 2024-01-20 (×2): qty 0.06

## 2024-01-20 MED ORDER — INSULIN GLARGINE-YFGN 100 UNIT/ML ~~LOC~~ SOPN: 6.0000 [IU] | PEN_INJECTOR | SUBCUTANEOUS | Status: DC

## 2024-01-20 MED ORDER — CARMEX CLASSIC LIP BALM EX OINT
TOPICAL_OINTMENT | CUTANEOUS | Status: DC | PRN
  Administered 2024-01-21: 1 via TOPICAL
  Filled 2024-01-20: qty 10

## 2024-01-20 NOTE — Progress Notes (Addendum)
 PROGRESS NOTE  Alexis Hopkins ZOX:[id number] DOB: [birth year]   PCP: Glena Landau, MD  Patient is from: SNF  DOA: 01/17/2024 LOS: 2  Chief complaints Chief Complaint  Patient presents with   Low Hgb 4.4     Brief Narrative / Interim history: 78 year old F with PMH of recent left hip fracture for which she had IM, CKD-3B, DM-2, HTN, HLD, anemia and GERD sent to ED from SNF due to melanotic stool and generalized weakness, and admitted with symptomatic acute on chronic blood loss anemia due to suspected upper GI bleed.  Patient was on aspirin  81 mg twice daily since she had hip surgery last month.  In ED, stable vitals.  BUN 79.  Cr 1.68 (was 1.5 on 3/26).  Hgb 4.4 (was 7.9 on discharge last month, and reportedly 9.0 at SNF).  Hemoccult positive.  Two units of blood ordered.  Started on IV Protonix .  [facility], Dr. Feliberto Hopping consulted.   The next day, hemoglobin improved to 7.1 after 2 units.  Anemia panel with iron  deficiency.  She received IV Venofer  300 mg x 1.  EGD showed large grade a reflux esophagitis without bleeding, nonbleeding gastric ulcer with no stigmata of bleeding, normal duodenum.  GI recommended p.o. Protonix  40 mg twice daily for 1 month followed by once daily and decreasing aspirin  to 81 mg once daily if doing well with rehab.   Transfused third unit on 4/19.  Hemoglobin improved and stable.  Stable for discharge to SNF after approval by insurance.  Subjective: Seen and examined earlier this morning.  No major events overnight of this morning.  Had a small bowel movement yesterday.  Stool clearing.  No complaints.  Objective: Vitals:   01/20/24 0008 01/20/24 0500 01/20/24 0727 01/20/24 1050  BP: 118/64 132/72 127/62 137/70  Pulse: 95 95 81 81  Resp: 16 20 19 16   Temp: 97.7 F (36.5 C) 97.9 F (36.6 C) 97.8 F (36.6 C)   TempSrc: Oral Oral Oral   SpO2: 98% 98% 95% 96%  Weight:  75.8 kg    Height:        Examination:  GENERAL: No apparent distress.   Nontoxic. HEENT: MMM.  Vision and hearing grossly intact.  NECK: Supple.  No apparent JVD.  RESP:  No IWOB.  Fair aeration bilaterally. CVS:  RRR. Heart sounds normal.  ABD/GI/GU: BS+. Abd soft, NTND.  MSK/EXT:  Moves extremities. No apparent deformity.  LLE edema. SKIN: no apparent skin lesion or wound NEURO: Awake, alert and oriented appropriately.  No apparent focal neuro deficit. PSYCH: Calm. Normal affect.   Consultants:  Gastroenterology  Procedures: None  Microbiology summarized: None  Assessment and plan: Symptomatic iron  deficiency anemia due to acute on chronic blood loss from upper GI bleed: Presents with melena and generalized weakness.  Hemoccult positive.  Patient was aspirin  81 mg twice daily after recent hip surgery.  Anemia panel with iron  deficiency.  Received 3 units of PRBC and 300 mg of IV Venofer .  Hgb improved. Recent Labs    12/25/23 0331 12/26/23 0336 01/17/24 2257 01/18/24 0645 01/18/24 1230 01/18/24 1821 01/19/24 0307 01/19/24 1306 01/20/24 0327 01/20/24 1135  HGB 7.7* 7.9* 4.4* 7.1* 7.4* 8.3* 7.0* 9.2* 8.3* 9.0*  -Monitor H&H and transfuse for Hgb less than 7.0 -GI recommended p.o. Protonix  40 mg twice daily followed by daily -Decrease aspirin  to 81 mg daily -Continue soft diet  NIDDM-2 with CKD-3B: A1c 6.7% on 3/13.  On glimepiride  and metformin  at home Recent Labs  Lab 01/19/24 1204 01/19/24 1608 01/19/24 2104 01/20/24 0555 01/20/24 1111  GLUCAP 166* 318* 145* 165* 206*  - Add Semglee  60 units daily - Continue SSI-sensitive -Continue holding home meds.  CKD-3B: Cr 1.0 on 3/19 and 1.5 on 3/26.  Could be due to to anemia, lisinopril  and HCTZ.  Improved. Recent Labs    12/16/23 0523 12/17/23 0446 12/18/23 0518 12/19/23 0341 12/26/23 0336 01/17/24 2257 01/18/24 0645 01/19/24 0307 01/19/24 0308 01/20/24 0327  BUN 37* 32* 36* 36* 46* 79* 71* 50* 47* 30*  CREATININE 1.22* 1.09* 1.19* 1.00 1.49* 1.65* 1.54* 1.66* 1.71* 1.30*   -Continue holding lisinopril  and HCTZ to allow renal recovery -Treat anemia as above -Continue monitoring  Essential hypertension: Normotensive on low-dose amlodipine . - Continue amlodipine  - P.o. hydralazine  as needed - Continue holding lisinopril  and HCTZ  Left lower extremity edema: Chronic for patient.  Patient and patient's son reports that she had lower extremity venous Doppler at SNF that was negative for DVT.  No calf tenderness on exam.  Recent right hip fracture s/p IM nailing: Recall wound healed.  Staples out. - Outpatient follow-up with orthopedic surgery - PT/OT-recommended SNF  Generalized weakness: Likely due to anemia - Treat anemia as above - PT/OT-recommended SNF  Leukocytosis/bandemia: Likely demargination.  Resolved without antibiotics.  Bacteriuria: No UTI symptoms.  Hyperkalemia: Resolved.  Class I obesity Body mass index is 30.56 kg/m.          DVT prophylaxis:  SCDs Start: 01/18/24 0114  Code Status: Full code Family Communication: Updated patient's daughter, Bridgette Campus over the phone Level of care: Telemetry Medical Status is: Inpatient The patient will remain inpatient because: Insurance authorization for SNF   Final disposition: SNF   35 minutes with more than 50% spent in reviewing records, counseling patient/family and coordinating care.   Sch Meds:  Scheduled Meds:  sodium chloride    Intravenous Once   amLODipine   5 mg Oral Daily   insulin  aspart  0-5 Units Subcutaneous QHS   insulin  aspart  0-9 Units Subcutaneous TID WC   pantoprazole   40 mg Oral BID   Continuous Infusions:   PRN Meds:.acetaminophen  **OR** acetaminophen , fentaNYL  (SUBLIMAZE ) injection, hydrALAZINE , lip balm, naLOXone  (NARCAN )  injection, ondansetron  (ZOFRAN ) IV  Antimicrobials: Anti-infectives (From admission, onward)    None        I have personally reviewed the following labs and images: CBC: Recent Labs  Lab 01/17/24 2257 01/18/24 0645  01/18/24 1230 01/18/24 1821 01/19/24 0307 01/19/24 1306 01/20/24 0327 01/20/24 1135  WBC 15.2* 11.2*  --   --  9.7 9.4 8.4  --   NEUTROABS  --  7.6  --   --   --   --   --   --   HGB 4.4* 7.1*   < > 8.3* 7.0* 9.2* 8.3* 9.0*  HCT 15.3* 22.5*   < > 25.8* 22.1* 28.2* 26.3* 28.9*  MCV 104.8* 95.7  --   --  96.9 94.0 95.6  --   PLT 383 319  --   --  319 340 315  --    < > = values in this interval not displayed.   BMP &GFR Recent Labs  Lab 01/17/24 2257 01/18/24 0645 01/19/24 0307 01/19/24 0308 01/20/24 0327  NA 134* 137 140 140 140  K 5.2* 5.0 4.4 4.4 3.9  CL 106 109 108 108 107  CO2 16* 19* 22 22 23   GLUCOSE 133* 93 122* 121* 146*  BUN 79* 71* 50* 47* 30*  CREATININE  1.65* 1.54* 1.66* 1.71* 1.30*  CALCIUM  9.5 9.1 8.6* 8.5* 8.8*  MG  --  2.8* 2.5*  --  2.1  PHOS  --   --   --  4.5 4.0   Estimated Creatinine Clearance: 35.1 mL/min (A) (by C-G formula based on SCr of 1.3 mg/dL (H)). Liver & Pancreas: Recent Labs  Lab 01/17/24 2257 01/18/24 0645 01/19/24 0308 01/20/24 0327  AST 20 14*  --   --   ALT 16 14  --   --   ALKPHOS 75 69  --   --   BILITOT 0.7 1.1  --   --   PROT 5.9* 5.6*  --   --   ALBUMIN  3.2* 3.0* 2.8* 3.0*   No results for input(s): "LIPASE", "AMYLASE" in the last 168 hours. No results for input(s): "AMMONIA" in the last 168 hours. Diabetic: No results for input(s): "HGBA1C" in the last 72 hours. Recent Labs  Lab 01/19/24 1204 01/19/24 1608 01/19/24 2104 01/20/24 0555 01/20/24 1111  GLUCAP 166* 318* 145* 165* 206*   Cardiac Enzymes: No results for input(s): "CKTOTAL", "CKMB", "CKMBINDEX", "TROPONINI" in the last 168 hours. No results for input(s): "PROBNP" in the last 8760 hours. Coagulation Profile: Recent Labs  Lab 01/18/24 1821  INR 1.1   Thyroid  Function Tests: No results for input(s): "TSH", "T4TOTAL", "FREET4", "T3FREE", "THYROIDAB" in the last 72 hours. Lipid Profile: No results for input(s): "CHOL", "HDL", "LDLCALC", "TRIG",  "CHOLHDL", "LDLDIRECT" in the last 72 hours. Anemia Panel: Recent Labs    01/17/24 2257 01/18/24 0645 01/18/24 1821  VITAMINB12  --   --  592  FOLATE 36.0  --   --   FERRITIN  --  63  --   TIBC  --  400  --   IRON   --  35  --   RETICCTPCT 15.1*  --   --    Urine analysis:    Component Value Date/Time   COLORURINE YELLOW 01/18/2024 0658   APPEARANCEUR HAZY (A) 01/18/2024 0658   LABSPEC 1.011 01/18/2024 0658   PHURINE 5.0 01/18/2024 0658   GLUCOSEU NEGATIVE 01/18/2024 0658   HGBUR SMALL (A) 01/18/2024 0658   BILIRUBINUR NEGATIVE 01/18/2024 0658   KETONESUR NEGATIVE 01/18/2024 0658   PROTEINUR NEGATIVE 01/18/2024 0658   UROBILINOGEN 0.2 03/04/2015 1003   NITRITE NEGATIVE 01/18/2024 0658   LEUKOCYTESUR MODERATE (A) 01/18/2024 0658   Sepsis Labs: Invalid input(s): "PROCALCITONIN", "LACTICIDVEN"  Microbiology: No results found for this or any previous visit (from the past 240 hours).  Radiology Studies: No results found.    Kamden Reber T. Rex Oesterle [facility]  If 7PM-7AM, please contact night-coverage [email or web address] 01/20/2024, 2:53 PM

## 2024-01-20 NOTE — Plan of Care (Signed)

## 2024-01-20 NOTE — TOC Progression Note (Signed)
 Transition of Care [facility]) - Progression Note    Patient Details  Name: Alexis Hopkins MRN: [id number] Date of Birth: Feb 13, 1946  [facility]) CM/SW Contact  Valley Gavia, LCSWA Phone Number: 01/20/2024, 10:18 AM  Clinical Narrative:     CSW spoke with [facility] customer service, auth pending medical review.   Expected Discharge Plan: Skilled Nursing Facility Barriers to Discharge: Continued Medical Work up  Expected Discharge Plan and Services In-house Referral: Clinical Social Work     Living arrangements for the past 2 months:  (came from [facility] short term rehab) Expected Discharge Date: 01/19/24                                     Social Determinants of Health (SDOH) Interventions SDOH Screenings   Food Insecurity: No Food Insecurity (01/18/2024)  Housing: Low Risk  (01/18/2024)  Transportation Needs: No Transportation Needs (01/18/2024)  Utilities: Not At Risk (01/18/2024)  Social Connections: Moderately Integrated (01/18/2024)  Tobacco Use: Low Risk  (01/18/2024)    Readmission Risk Interventions    12/26/2023    1:05 PM 12/14/2023   10:13 AM  Readmission Risk Prevention Plan  Post Dischage Appt  Complete  Medication Screening  Complete  Transportation Screening Complete Complete  PCP or Specialist Appt within 5-7 Days Complete   Home Care Screening Complete   Medication Review (RN CM) Complete

## 2024-01-20 NOTE — Plan of Care (Signed)
  Problem: Education: Goal: Ability to describe self-care measures that may prevent or decrease complications (Diabetes Survival Skills Education) will improve Outcome: Progressing   Problem: Coping: Goal: Ability to adjust to condition or change in health will improve Outcome: Progressing   Problem: Safety: Goal: Ability to remain free from injury will improve Outcome: Progressing

## 2024-01-21 ENCOUNTER — Encounter (HOSPITAL_COMMUNITY): Payer: Self-pay | Admitting: Gastroenterology

## 2024-01-21 DIAGNOSIS — K922 Gastrointestinal hemorrhage, unspecified: Secondary | ICD-10-CM | POA: Diagnosis not present

## 2024-01-21 DIAGNOSIS — Z8673 Personal history of transient ischemic attack (TIA), and cerebral infarction without residual deficits: Secondary | ICD-10-CM | POA: Diagnosis not present

## 2024-01-21 DIAGNOSIS — N1832 Chronic kidney disease, stage 3b: Secondary | ICD-10-CM | POA: Diagnosis not present

## 2024-01-21 DIAGNOSIS — M810 Age-related osteoporosis without current pathological fracture: Secondary | ICD-10-CM | POA: Diagnosis not present

## 2024-01-21 DIAGNOSIS — R278 Other lack of coordination: Secondary | ICD-10-CM | POA: Diagnosis not present

## 2024-01-21 DIAGNOSIS — R5381 Other malaise: Secondary | ICD-10-CM | POA: Diagnosis not present

## 2024-01-21 DIAGNOSIS — I129 Hypertensive chronic kidney disease with stage 1 through stage 4 chronic kidney disease, or unspecified chronic kidney disease: Secondary | ICD-10-CM | POA: Diagnosis not present

## 2024-01-21 DIAGNOSIS — M6259 Muscle wasting and atrophy, not elsewhere classified, multiple sites: Secondary | ICD-10-CM | POA: Diagnosis not present

## 2024-01-21 DIAGNOSIS — E559 Vitamin D deficiency, unspecified: Secondary | ICD-10-CM | POA: Diagnosis not present

## 2024-01-21 DIAGNOSIS — K219 Gastro-esophageal reflux disease without esophagitis: Secondary | ICD-10-CM | POA: Diagnosis not present

## 2024-01-21 DIAGNOSIS — D649 Anemia, unspecified: Secondary | ICD-10-CM | POA: Diagnosis not present

## 2024-01-21 DIAGNOSIS — D500 Iron deficiency anemia secondary to blood loss (chronic): Secondary | ICD-10-CM | POA: Diagnosis not present

## 2024-01-21 DIAGNOSIS — I10 Essential (primary) hypertension: Secondary | ICD-10-CM | POA: Diagnosis not present

## 2024-01-21 DIAGNOSIS — H04129 Dry eye syndrome of unspecified lacrimal gland: Secondary | ICD-10-CM | POA: Diagnosis not present

## 2024-01-21 DIAGNOSIS — R110 Nausea: Secondary | ICD-10-CM | POA: Diagnosis not present

## 2024-01-21 DIAGNOSIS — M19011 Primary osteoarthritis, right shoulder: Secondary | ICD-10-CM | POA: Diagnosis not present

## 2024-01-21 DIAGNOSIS — R262 Difficulty in walking, not elsewhere classified: Secondary | ICD-10-CM | POA: Diagnosis not present

## 2024-01-21 DIAGNOSIS — E785 Hyperlipidemia, unspecified: Secondary | ICD-10-CM | POA: Diagnosis not present

## 2024-01-21 DIAGNOSIS — Z85828 Personal history of other malignant neoplasm of skin: Secondary | ICD-10-CM | POA: Diagnosis not present

## 2024-01-21 DIAGNOSIS — H109 Unspecified conjunctivitis: Secondary | ICD-10-CM | POA: Diagnosis not present

## 2024-01-21 DIAGNOSIS — Z741 Need for assistance with personal care: Secondary | ICD-10-CM | POA: Diagnosis not present

## 2024-01-21 DIAGNOSIS — S72142D Displaced intertrochanteric fracture of left femur, subsequent encounter for closed fracture with routine healing: Secondary | ICD-10-CM | POA: Diagnosis not present

## 2024-01-21 DIAGNOSIS — G90512 Complex regional pain syndrome I of left upper limb: Secondary | ICD-10-CM | POA: Diagnosis not present

## 2024-01-21 DIAGNOSIS — K5900 Constipation, unspecified: Secondary | ICD-10-CM | POA: Diagnosis not present

## 2024-01-21 DIAGNOSIS — R600 Localized edema: Secondary | ICD-10-CM | POA: Diagnosis not present

## 2024-01-21 DIAGNOSIS — H00014 Hordeolum externum left upper eyelid: Secondary | ICD-10-CM | POA: Diagnosis not present

## 2024-01-21 DIAGNOSIS — D62 Acute posthemorrhagic anemia: Secondary | ICD-10-CM | POA: Diagnosis not present

## 2024-01-21 DIAGNOSIS — E119 Type 2 diabetes mellitus without complications: Secondary | ICD-10-CM | POA: Diagnosis not present

## 2024-01-21 DIAGNOSIS — N179 Acute kidney failure, unspecified: Secondary | ICD-10-CM | POA: Diagnosis not present

## 2024-01-21 DIAGNOSIS — E1169 Type 2 diabetes mellitus with other specified complication: Secondary | ICD-10-CM | POA: Diagnosis not present

## 2024-01-21 LAB — RENAL FUNCTION PANEL
Albumin: 3 g/dL — ABNORMAL LOW (ref 3.5–5.0)
Anion gap: 11 (ref 5–15)
BUN: 24 mg/dL — ABNORMAL HIGH (ref 8–23)
CO2: 23 mmol/L (ref 22–32)
Calcium: 8.8 mg/dL — ABNORMAL LOW (ref 8.9–10.3)
Chloride: 106 mmol/L (ref 98–111)
Creatinine, Ser: 1.43 mg/dL — ABNORMAL HIGH (ref 0.44–1.00)
GFR, Estimated: 38 mL/min — ABNORMAL LOW (ref >60)
Glucose, Bld: 137 mg/dL — ABNORMAL HIGH (ref 70–99)
Phosphorus: 4 mg/dL (ref 2.5–4.6)
Potassium: 4 mmol/L (ref 3.5–5.1)
Sodium: 140 mmol/L (ref 135–145)

## 2024-01-21 LAB — GLUCOSE, CAPILLARY
Glucose-Capillary: 163 mg/dL — ABNORMAL HIGH (ref 70–99)
Glucose-Capillary: 238 mg/dL — ABNORMAL HIGH (ref 70–99)
Glucose-Capillary: 167 mg/dL — ABNORMAL HIGH (ref 70–99)

## 2024-01-21 LAB — CBC
HCT: 29.4 % — ABNORMAL LOW (ref 36.0–46.0)
Hemoglobin: 9 g/dL — ABNORMAL LOW (ref 12.0–15.0)
MCH: 30.1 pg (ref 26.0–34.0)
MCHC: 30.6 g/dL (ref 30.0–36.0)
MCV: 98.3 fL (ref 80.0–100.0)
Platelets: 314 10*3/uL (ref 150–400)
RBC: 2.99 MIL/uL — ABNORMAL LOW (ref 3.87–5.11)
RDW: 18.6 % — ABNORMAL HIGH (ref 11.5–15.5)
WBC: 8.5 10*3/uL (ref 4.0–10.5)
nRBC: 0 % (ref 0.0–0.2)

## 2024-01-21 LAB — MAGNESIUM: Magnesium: 1.8 mg/dL (ref 1.7–2.4)

## 2024-01-21 NOTE — Progress Notes (Signed)
 Mobility Specialist Progress Note:   01/21/24 1330  Mobility  Activity Ambulated with assistance in hallway  Level of Assistance Contact guard assist, steadying assist  Assistive Device Front wheel walker  Distance Ambulated (ft) 60 ft  Activity Response Tolerated well  Mobility Referral Yes  Mobility visit 1 Mobility  Mobility Specialist Start Time (ACUTE ONLY) 1330  Mobility Specialist Stop Time (ACUTE ONLY) 1345  Mobility Specialist Time Calculation (min) (ACUTE ONLY) 15 min   Pt agreeable to mobility session. Required contact assist for safety during ambulation. Pt continues to ambulate with limp d/t knee pain. HR elevated to 130s with exertion. Back in bed with all needs met.   Oneda Big Mobility Specialist Please contact via SecureChat or  Rehab office at [phone]

## 2024-01-21 NOTE — Plan of Care (Signed)

## 2024-01-21 NOTE — TOC Progression Note (Addendum)
 Transition of Care [facility]) - Progression Note    Patient Details  Name: Alexis Hopkins MRN: [id number] Date of Birth: 09/14/1946  Transition of Care [facility]) CM/SW Contact  Valery Gaucher, [place] Phone Number: 01/21/2024, 11:23 AM  Clinical Narrative:     Sent additional clinicals to insurance as requested- waiting on approval  Sent message to [facility] to inform of d/c today pending insurance- waiting on call back   Liddie Reel, MSW, LCSW Clinical Social Worker    Expected Discharge Plan: Skilled Nursing Facility Barriers to Discharge: Insurance Authorization  Expected Discharge Plan and Services In-house Referral: Clinical Social Work     Living arrangements for the past 2 months:  (came from [facility] short term rehab) Expected Discharge Date: 01/21/24                                     Social Determinants of Health (SDOH) Interventions SDOH Screenings   Food Insecurity: No Food Insecurity (01/18/2024)  Housing: Low Risk  (01/18/2024)  Transportation Needs: No Transportation Needs (01/18/2024)  Utilities: Not At Risk (01/18/2024)  Social Connections: Moderately Integrated (01/18/2024)  Tobacco Use: Low Risk  (01/18/2024)    Readmission Risk Interventions    12/26/2023    1:05 PM 12/14/2023   10:13 AM  Readmission Risk Prevention Plan  Post Dischage Appt  Complete  Medication Screening  Complete  Transportation Screening Complete Complete  PCP or Specialist Appt within 5-7 Days Complete   Home Care Screening Complete   Medication Review (RN CM) Complete

## 2024-01-21 NOTE — Care Management Important Message (Signed)
 Important Message  Patient Details  Name: Alexis Hopkins MRN: [id number] Date of Birth: Aug 17, 1946   Important Message Given:  Yes - Medicare IM     Janith Melnick 01/21/2024, 11:49 AM

## 2024-01-21 NOTE — Plan of Care (Signed)
  Problem: Health Behavior/Discharge Planning: Goal: Ability to manage health-related needs will improve Outcome: Progressing   Problem: Metabolic: Goal: Ability to maintain appropriate glucose levels will improve Outcome: Progressing   

## 2024-01-21 NOTE — Discharge Summary (Signed)
 Physician Discharge Summary  Alexis ULRICH HQI:[id number] DOB: 1946/01/18 DOA: 01/17/2024  PCP: Glena Landau, MD  Admit date: 01/17/2024 Discharge date: 01/21/24  Admitted From: SNF Disposition: SNF Recommendations for Outpatient Follow-up:   Reassess blood pressure and adjust meds as appropriate Check CMP and CBC in 1 week Please follow up on the following pending results: None   Discharge Condition: Stable CODE STATUS: Full code   Hospital course 78 year old F with PMH of recent left hip fracture for which she had IM, CKD-3B, DM-2, HTN, HLD, anemia and GERD sent to ED from SNF due to melanotic stool and generalized weakness, and admitted with symptomatic acute on chronic blood loss anemia due to suspected upper GI bleed.  Patient was on aspirin  81 mg twice daily since she had hip surgery last month.   In ED, stable vitals.  BUN 79.  Cr 1.68 (was 1.5 on 3/26).  Hgb 4.4 (was 7.9 on discharge last month, and reportedly 9.0 at SNF).  Hemoccult positive.  Two units of blood ordered.  Started on IV Protonix .  [facility], Dr. Feliberto Hopping consulted.     EGD showed large grade a reflux esophagitis without bleeding, nonbleeding gastric ulcer with no stigmata of bleeding, normal duodenum.  GI recommended p.o. Protonix  40 mg twice daily for 1 month followed by once daily.  Anemia panel with iron  deficiency.  Received IV Venofer  300 mg x 1.  Transfused a total of 3 units.  Hemoglobin improved to 9.0 and remained stable.  Therapy recommended SNF.  Recommend repeat CMP and CBC in 1 week.  See individual problem list below for more.   Problems addressed during this hospitalization Symptomatic iron  deficiency anemia due to acute on chronic blood loss from upper GI bleed: Presents with melena and generalized weakness.  Hemoccult positive.  Patient was aspirin  81 mg twice daily after recent hip surgery.  Anemia panel with iron  deficiency.  Received 3 units of PRBC and 300 mg of IV Venofer .  Hgb improved  to 9.0 and stable. -Continue p.o. Protonix  40 mg twice daily for 1 month followed by 40 mg once daily -Discontinue low-dose aspirin .  She is now 5 weeks after surgery -Recheck CBC in 1 week.   NIDDM-2 with CKD-3B: A1c 6.7% on 3/13.  On glimepiride  and metformin  at home -Continue metformin  and glimepiride . -Can consider switching metformin  to SGLT2 inhibitors   CKD-3B: Cr 1.0 on 3/19 and 1.5 on 3/26.  Could be due to to anemia, lisinopril  and HCTZ.  Creatinine now stable between 1.3-1.4 -Discontinue lisinopril  and HCTZ.   Essential hypertension: Normotensive on amlodipine . - Continue amlodipine  - Discontinued lisinopril  and HCTZ.   Left lower extremity edema: Chronic for patient.  Patient and patient's son reports that she had lower extremity venous Doppler at SNF that was negative for DVT.  No calf tenderness on exam.   Recent right hip fracture s/p IM nailing: Recall wound healed.  Staples out. - Outpatient follow-up with orthopedic surgery - PT/OT-recommended SNF   Generalized weakness: Likely due to anemia.  Proved. - Treat anemia as above - PT/OT-recommended SNF   Leukocytosis/bandemia: Likely demargination.  Resolved without antibiotics.   Bacteriuria: No UTI symptoms.   Hyperkalemia: Resolved.   Class I obesity Body mass index is 30.4 kg/m.             Time spent 35 minutes  Vital signs Vitals:   01/20/24 2347 01/21/24 0315 01/21/24 0317 01/21/24 0716  BP: 121/62  (!) 150/67   Pulse: 96  75   Temp: 98.5 F (36.9 C)  97.7 F (36.5 C)   Resp: 20  19   Height:      Weight:  75.4 kg    SpO2: 100%  100%   TempSrc: Oral  Oral Oral  BMI (Calculated):  30.39       Discharge exam  GENERAL: No apparent distress.  Nontoxic. HEENT: MMM.  Vision and hearing grossly intact.  NECK: Supple.  No apparent JVD.  RESP:  No IWOB.  Fair aeration bilaterally. CVS:  RRR. Heart sounds normal.  ABD/GI/GU: BS+. Abd soft, NTND.  MSK/EXT:  Moves extremities. No apparent  deformity.  Trace LLE edema. SKIN: no apparent skin lesion or wound NEURO: Awake, alert and oriented appropriately.  No apparent focal neuro deficit. PSYCH: Calm. Normal affect.  Discharge Instructions Discharge Instructions     Diet - low sodium heart healthy   Complete by: As directed    Diet Carb Modified   Complete by: As directed    Increase activity slowly   Complete by: As directed       Allergies as of 01/21/2024       Reactions   Motrin [ibuprofen] Other (See Comments)   Per MD-- does not want her taking.    Shellfish Allergy Hives   Shrimp.        Medication List     STOP taking these medications    aspirin  81 MG chewable tablet Commonly known as: Aspirin  Childrens   b complex vitamins capsule   famotidine 20 MG tablet Commonly known as: PEPCID   hydrochlorothiazide  12.5 MG tablet Commonly known as: HYDRODIURIL    HYDROcodone -acetaminophen  5-325 MG tablet Commonly known as: NORCO/VICODIN   lisinopril  40 MG tablet Commonly known as: ZESTRIL    methocarbamol  500 MG tablet Commonly known as: ROBAXIN        TAKE these medications    acetaminophen  500 MG tablet Commonly known as: TYLENOL  Take 2 tablets (1,000 mg total) by mouth every 8 (eight) hours as needed for mild pain (pain score 1-3) or headache. What changed:  when to take this reasons to take this   amLODipine  10 MG tablet Commonly known as: NORVASC  Take 10 mg by mouth every morning.   amoxicillin  500 MG capsule Commonly known as: AMOXIL  Take 2,000 mg by mouth See admin instructions. Take 4 capsules (2000 mg) by mouth 1 hour prior to dental appointments   calcium -vitamin D  500-200 MG-UNIT tablet Commonly known as: OSCAL WITH D Take 1 tablet by mouth every morning.   colesevelam  625 MG tablet Commonly known as: WELCHOL  Take 1,250 mg by mouth 2 (two) times daily with a meal.   docusate sodium  100 MG capsule Commonly known as: COLACE Take 200 mg by mouth daily.   ferrous  sulfate 325 (65 FE) MG tablet Take 1 tablet (325 mg total) by mouth daily with breakfast.   glimepiride  2 MG tablet Commonly known as: AMARYL  Take 1 mg by mouth 2 (two) times daily.   metFORMIN  1000 MG tablet Commonly known as: GLUCOPHAGE  Take 1,000 mg by mouth 2 (two) times daily with a meal.   multivitamin with minerals Tabs tablet Take 1 tablet by mouth every morning.   ondansetron  4 MG disintegrating tablet Commonly known as: ZOFRAN -ODT Take 4 mg by mouth every 6 (six) hours as needed for nausea.   OSTEO BI-FLEX TRIPLE STRENGTH PO Take 1 tablet by mouth 2 (two) times daily.   OVER THE COUNTER MEDICATION Place 1 application into both eyes at bedtime.  Pure and gentle lubricant eye pad   pantoprazole  40 MG tablet Commonly known as: Protonix  Take 1 tablet (40 mg total) by mouth 2 (two) times daily for 30 days, THEN 1 tablet (40 mg total) daily. Start taking on: January 19, 2024   polyethylene glycol 17 g packet Commonly known as: MIRALAX  / GLYCOLAX  Take 17 g by mouth every morning.   pravastatin  40 MG tablet Commonly known as: PRAVACHOL  Take 40 mg by mouth daily.   Refresh 1.4-0.6 % Soln Generic drug: Polyvinyl Alcohol -Povidone PF Place 1 drop into both eyes daily as needed (for dry eye).   sodium chloride  0.65 % Soln nasal spray Commonly known as: OCEAN Place 1-2 sprays into both nostrils daily as needed (Dry nose).        Consultations: Gastroenterology  Procedures/Studies: EGD showed large grade a reflux esophagitis without bleeding, nonbleeding gastric ulcer with no stigmata of bleeding, normal duodenum.    DG Knee Complete 4 Views Left Result Date: 12/24/2023 CLINICAL DATA:  Knee pain EXAM: LEFT KNEE - COMPLETE 4+ VIEW COMPARISON:  12/13/2023 FINDINGS: Prior left total knee arthroplasty. No periprosthetic lucency or fracture. No knee joint effusion. New intramedullary rod and distal interlocking screw within the femur. No focal soft tissue swelling about  the knee. IMPRESSION: Prior left total knee arthroplasty without evidence of hardware complication. Electronically Signed   By: Leverne Reading D.O.   On: 12/24/2023 13:48   DG HIP UNILAT WITH PELVIS 1V LEFT Result Date: 12/24/2023 CLINICAL DATA:  [id number] Knee pain 125018 Leg pain. EXAM: DG HIP (WITH OR WITHOUT PELVIS) 1V*L* COMPARISON:  Left femur radiographs 12/14/2023. FINDINGS: AP and cross-table lateral views of the proximal left femur. The distal left femur is imaged on the knee radiographs, dictated separately. Intact hardware status post left femoral intramedullary nail and dynamic screw fixation of a comminuted subtrochanteric femur fracture. The visualized hardware is intact without loosening. The alignment of the fracture is stable. Lateral skin staples are in place without suspicious soft tissue findings. IMPRESSION: Stable alignment of comminuted subtrochanteric left femur fracture status post ORIF. No evidence of hardware complication. See separate examination of the left knee. Electronically Signed   By: Elmon Hagedorn M.D.   On: 12/24/2023 13:48       The results of significant diagnostics from this hospitalization (including imaging, microbiology, ancillary and laboratory) are listed below for reference.     Microbiology: No results found for this or any previous visit (from the past 240 hours).   Labs:  CBC: Recent Labs  Lab 01/18/24 0645 01/18/24 1230 01/19/24 0307 01/19/24 1306 01/20/24 0327 01/20/24 1135 01/21/24 0306  WBC 11.2*  --  9.7 9.4 8.4  --  8.5  NEUTROABS 7.6  --   --   --   --   --   --   HGB 7.1*   < > 7.0* 9.2* 8.3* 9.0* 9.0*  HCT 22.5*   < > 22.1* 28.2* 26.3* 28.9* 29.4*  MCV 95.7  --  96.9 94.0 95.6  --  98.3  PLT 319  --  319 340 315  --  314   < > = values in this interval not displayed.   BMP &GFR Recent Labs  Lab 01/18/24 0645 01/19/24 0307 01/19/24 0308 01/20/24 0327 01/21/24 0306  NA 137 140 140 140 140  K 5.0 4.4 4.4 3.9 4.0  CL  109 108 108 107 106  CO2 19* 22 22 23 23   GLUCOSE 93 122* 121* 146* 137*  BUN  71* 50* 47* 30* 24*  CREATININE 1.54* 1.66* 1.71* 1.30* 1.43*  CALCIUM  9.1 8.6* 8.5* 8.8* 8.8*  MG 2.8* 2.5*  --  2.1 1.8  PHOS  --   --  4.5 4.0 4.0   Estimated Creatinine Clearance: 31.8 mL/min (A) (by C-G formula based on SCr of 1.43 mg/dL (H)). Liver & Pancreas: Recent Labs  Lab 01/17/24 2257 01/18/24 0645 01/19/24 0308 01/20/24 0327 01/21/24 0306  AST 20 14*  --   --   --   ALT 16 14  --   --   --   ALKPHOS 75 69  --   --   --   BILITOT 0.7 1.1  --   --   --   PROT 5.9* 5.6*  --   --   --   ALBUMIN  3.2* 3.0* 2.8* 3.0* 3.0*   No results for input(s): "LIPASE", "AMYLASE" in the last 168 hours. No results for input(s): "AMMONIA" in the last 168 hours. Diabetic: No results for input(s): "HGBA1C" in the last 72 hours. Recent Labs  Lab 01/20/24 0555 01/20/24 1111 01/20/24 1553 01/20/24 2054 01/21/24 0610  GLUCAP 165* 206* 322* 126* 163*   Cardiac Enzymes: No results for input(s): "CKTOTAL", "CKMB", "CKMBINDEX", "TROPONINI" in the last 168 hours. No results for input(s): "PROBNP" in the last 8760 hours. Coagulation Profile: Recent Labs  Lab 01/18/24 1821  INR 1.1   Thyroid  Function Tests: No results for input(s): "TSH", "T4TOTAL", "FREET4", "T3FREE", "THYROIDAB" in the last 72 hours. Lipid Profile: No results for input(s): "CHOL", "HDL", "LDLCALC", "TRIG", "CHOLHDL", "LDLDIRECT" in the last 72 hours. Anemia Panel: Recent Labs    01/18/24 1821  VITAMINB12 592   Urine analysis:    Component Value Date/Time   COLORURINE YELLOW 01/18/2024 0658   APPEARANCEUR HAZY (A) 01/18/2024 0658   LABSPEC 1.011 01/18/2024 0658   PHURINE 5.0 01/18/2024 0658   GLUCOSEU NEGATIVE 01/18/2024 0658   HGBUR SMALL (A) 01/18/2024 0658   BILIRUBINUR NEGATIVE 01/18/2024 0658   KETONESUR NEGATIVE 01/18/2024 0658   PROTEINUR NEGATIVE 01/18/2024 0658   UROBILINOGEN 0.2 03/04/2015 1003   NITRITE NEGATIVE  01/18/2024 0658   LEUKOCYTESUR MODERATE (A) 01/18/2024 0658   Sepsis Labs: Invalid input(s): "PROCALCITONIN", "LACTICIDVEN"   SIGNED:  Theadore Finger, MD  [facility] 01/21/2024, 11:04 AM

## 2024-01-21 NOTE — Care Management Important Message (Signed)
 Important Message  Patient Details  Name: Alexis Hopkins MRN: [id number] Date of Birth: 1946-06-05   Important Message Given:  Yes - Medicare IM     Janith Melnick 01/21/2024, 11:45 AM

## 2024-01-21 NOTE — TOC Transition Note (Signed)
 Transition of Care [facility]) - Discharge Note   Patient Details  Name: [name] MRN: [id number] Date of Birth: Apr 15, 1946  [facility] CM/SW Contact:  Valery Gaucher, LCSW Phone Number: 01/21/2024, 4:27 PM   Clinical Narrative:     Patient will Discharge to: [facility] Discharge Date: 01/21/2024 Family Notified:daughter Transport By: daughter/car  Per MD patient is ready for discharge. RN, patient, and facility notified of discharge. Discharge Summary sent to facility. RN given number for report [phone])  Clinical Social Worker signing off.  Liddie Reel, MSW, LCSW Clinical Social Worker     Final next level of care: Skilled Nursing Facility Barriers to Discharge: Barriers Resolved   Patient Goals and CMS Choice Patient states their goals for this hospitalization and ongoing recovery are:: SNF   Choice offered to / list presented to : Patient      Discharge Placement              Patient [facility] bed at: [facility] Patient to be transferred to facility by: family,daughter Name of family member notified: daughter Patient and family notified of of transfer: 01/21/24  Discharge Plan and Services Additional resources added to the After Visit Summary for   In-house Referral: Clinical Social Work                                   Social Drivers of Health (SDOH) Interventions SDOH Screenings   Food Insecurity: No Food Insecurity (01/18/2024)  Housing: Low Risk  (01/18/2024)  Transportation Needs: No Transportation Needs (01/18/2024)  Utilities: Not At Risk (01/18/2024)  Social Connections: Moderately Integrated (01/18/2024)  Tobacco Use: Low Risk  (01/18/2024)     Readmission Risk Interventions    12/26/2023    1:05 PM 12/14/2023   10:13 AM  Readmission Risk Prevention Plan  Post Dischage Appt  Complete  Medication Screening  Complete  Transportation Screening Complete Complete  PCP or Specialist Appt within  5-7 Days Complete   Home Care Screening Complete   Medication Review (RN CM) Complete

## 2024-01-21 NOTE — TOC Progression Note (Signed)
 Transition of Care [facility]) - Progression Note    Patient Details  Name: Alexis Hopkins MRN: [id number] Date of Birth: [birth year]  [facility] CM/SW Contact  Valery Gaucher, [place] Phone Number: 01/21/2024, 4:24 PM  Clinical Narrative:     Received insurance authorization  approval for SNF  # [id number] 4/21-4/23  Liddie Reel, MSW, LCSW Clinical Social Worker    Expected Discharge Plan: Skilled Nursing Facility Barriers to Discharge: Insurance Authorization  Expected Discharge Plan and Services In-house Referral: Clinical Social Work     Living arrangements for the past 2 months:  (came from [facility] short term rehab) Expected Discharge Date: 01/21/24                                     Social Determinants of Health (SDOH) Interventions SDOH Screenings   Food Insecurity: No Food Insecurity (01/18/2024)  Housing: Low Risk  (01/18/2024)  Transportation Needs: No Transportation Needs (01/18/2024)  Utilities: Not At Risk (01/18/2024)  Social Connections: Moderately Integrated (01/18/2024)  Tobacco Use: Low Risk  (01/18/2024)    Readmission Risk Interventions    12/26/2023    1:05 PM 12/14/2023   10:13 AM  Readmission Risk Prevention Plan  Post Dischage Appt  Complete  Medication Screening  Complete  Transportation Screening Complete Complete  PCP or Specialist Appt within 5-7 Days Complete   Home Care Screening Complete   Medication Review (RN CM) Complete

## 2024-01-21 NOTE — TOC Progression Note (Signed)
 Transition of Care [facility]) - Progression Note    Patient Details  Name: Alexis Hopkins MRN: [id number] Date of Birth: [birth year]  Transition of Care [facility]) CM/SW Contact  Valery Gaucher, [place] Phone Number: 01/21/2024, 1:30 PM  Clinical Narrative:     Insurance still pending   Expected Discharge Plan: Skilled Nursing Facility Barriers to Discharge: Insurance Authorization  Expected Discharge Plan and Services In-house Referral: Clinical Social Work     Living arrangements for the past 2 months:  (came from [facility] short term rehab) Expected Discharge Date: 01/21/24                                     Social Determinants of Health (SDOH) Interventions SDOH Screenings   Food Insecurity: No Food Insecurity (01/18/2024)  Housing: Low Risk  (01/18/2024)  Transportation Needs: No Transportation Needs (01/18/2024)  Utilities: Not At Risk (01/18/2024)  Social Connections: Moderately Integrated (01/18/2024)  Tobacco Use: Low Risk  (01/18/2024)    Readmission Risk Interventions    12/26/2023    1:05 PM 12/14/2023   10:13 AM  Readmission Risk Prevention Plan  Post Dischage Appt  Complete  Medication Screening  Complete  Transportation Screening Complete Complete  PCP or Specialist Appt within 5-7 Days Complete   Home Care Screening Complete   Medication Review (RN CM) Complete

## 2024-01-23 DIAGNOSIS — K219 Gastro-esophageal reflux disease without esophagitis: Secondary | ICD-10-CM | POA: Diagnosis not present

## 2024-01-23 DIAGNOSIS — I10 Essential (primary) hypertension: Secondary | ICD-10-CM | POA: Diagnosis not present

## 2024-01-23 DIAGNOSIS — N179 Acute kidney failure, unspecified: Secondary | ICD-10-CM | POA: Diagnosis not present

## 2024-01-23 DIAGNOSIS — E119 Type 2 diabetes mellitus without complications: Secondary | ICD-10-CM | POA: Diagnosis not present

## 2024-01-23 DIAGNOSIS — S72142D Displaced intertrochanteric fracture of left femur, subsequent encounter for closed fracture with routine healing: Secondary | ICD-10-CM | POA: Diagnosis not present

## 2024-01-23 DIAGNOSIS — K5900 Constipation, unspecified: Secondary | ICD-10-CM | POA: Diagnosis not present

## 2024-01-23 DIAGNOSIS — R5381 Other malaise: Secondary | ICD-10-CM | POA: Diagnosis not present

## 2024-01-23 DIAGNOSIS — R600 Localized edema: Secondary | ICD-10-CM | POA: Diagnosis not present

## 2024-01-23 DIAGNOSIS — E559 Vitamin D deficiency, unspecified: Secondary | ICD-10-CM | POA: Diagnosis not present

## 2024-01-23 DIAGNOSIS — D62 Acute posthemorrhagic anemia: Secondary | ICD-10-CM | POA: Diagnosis not present

## 2024-01-28 DIAGNOSIS — H00014 Hordeolum externum left upper eyelid: Secondary | ICD-10-CM | POA: Diagnosis not present

## 2024-02-04 DIAGNOSIS — Z5189 Encounter for other specified aftercare: Secondary | ICD-10-CM | POA: Diagnosis not present

## 2024-02-04 DIAGNOSIS — S72142D Displaced intertrochanteric fracture of left femur, subsequent encounter for closed fracture with routine healing: Secondary | ICD-10-CM | POA: Diagnosis not present

## 2024-02-05 DIAGNOSIS — M25552 Pain in left hip: Secondary | ICD-10-CM | POA: Diagnosis not present

## 2024-02-05 DIAGNOSIS — M25662 Stiffness of left knee, not elsewhere classified: Secondary | ICD-10-CM | POA: Diagnosis not present

## 2024-02-05 DIAGNOSIS — M25652 Stiffness of left hip, not elsewhere classified: Secondary | ICD-10-CM | POA: Diagnosis not present

## 2024-02-05 DIAGNOSIS — M6281 Muscle weakness (generalized): Secondary | ICD-10-CM | POA: Diagnosis not present

## 2024-02-07 DIAGNOSIS — M25552 Pain in left hip: Secondary | ICD-10-CM | POA: Diagnosis not present

## 2024-02-07 DIAGNOSIS — M25662 Stiffness of left knee, not elsewhere classified: Secondary | ICD-10-CM | POA: Diagnosis not present

## 2024-02-07 DIAGNOSIS — M25652 Stiffness of left hip, not elsewhere classified: Secondary | ICD-10-CM | POA: Diagnosis not present

## 2024-02-07 DIAGNOSIS — M6281 Muscle weakness (generalized): Secondary | ICD-10-CM | POA: Diagnosis not present

## 2024-02-11 DIAGNOSIS — K250 Acute gastric ulcer with hemorrhage: Secondary | ICD-10-CM | POA: Diagnosis not present

## 2024-02-11 DIAGNOSIS — N1832 Chronic kidney disease, stage 3b: Secondary | ICD-10-CM | POA: Diagnosis not present

## 2024-02-11 DIAGNOSIS — E041 Nontoxic single thyroid nodule: Secondary | ICD-10-CM | POA: Diagnosis not present

## 2024-02-11 DIAGNOSIS — I129 Hypertensive chronic kidney disease with stage 1 through stage 4 chronic kidney disease, or unspecified chronic kidney disease: Secondary | ICD-10-CM | POA: Diagnosis not present

## 2024-02-11 DIAGNOSIS — M810 Age-related osteoporosis without current pathological fracture: Secondary | ICD-10-CM | POA: Diagnosis not present

## 2024-02-11 DIAGNOSIS — E782 Mixed hyperlipidemia: Secondary | ICD-10-CM | POA: Diagnosis not present

## 2024-02-11 DIAGNOSIS — D500 Iron deficiency anemia secondary to blood loss (chronic): Secondary | ICD-10-CM | POA: Diagnosis not present

## 2024-02-11 DIAGNOSIS — Z8673 Personal history of transient ischemic attack (TIA), and cerebral infarction without residual deficits: Secondary | ICD-10-CM | POA: Diagnosis not present

## 2024-02-11 DIAGNOSIS — E1122 Type 2 diabetes mellitus with diabetic chronic kidney disease: Secondary | ICD-10-CM | POA: Diagnosis not present

## 2024-02-12 DIAGNOSIS — M25552 Pain in left hip: Secondary | ICD-10-CM | POA: Diagnosis not present

## 2024-02-12 DIAGNOSIS — M25652 Stiffness of left hip, not elsewhere classified: Secondary | ICD-10-CM | POA: Diagnosis not present

## 2024-02-12 DIAGNOSIS — M25662 Stiffness of left knee, not elsewhere classified: Secondary | ICD-10-CM | POA: Diagnosis not present

## 2024-02-12 DIAGNOSIS — M6281 Muscle weakness (generalized): Secondary | ICD-10-CM | POA: Diagnosis not present

## 2024-02-14 DIAGNOSIS — M25662 Stiffness of left knee, not elsewhere classified: Secondary | ICD-10-CM | POA: Diagnosis not present

## 2024-02-14 DIAGNOSIS — M25552 Pain in left hip: Secondary | ICD-10-CM | POA: Diagnosis not present

## 2024-02-14 DIAGNOSIS — M25652 Stiffness of left hip, not elsewhere classified: Secondary | ICD-10-CM | POA: Diagnosis not present

## 2024-02-14 DIAGNOSIS — M6281 Muscle weakness (generalized): Secondary | ICD-10-CM | POA: Diagnosis not present

## 2024-02-19 DIAGNOSIS — M25652 Stiffness of left hip, not elsewhere classified: Secondary | ICD-10-CM | POA: Diagnosis not present

## 2024-02-19 DIAGNOSIS — M25662 Stiffness of left knee, not elsewhere classified: Secondary | ICD-10-CM | POA: Diagnosis not present

## 2024-02-19 DIAGNOSIS — M6281 Muscle weakness (generalized): Secondary | ICD-10-CM | POA: Diagnosis not present

## 2024-02-19 DIAGNOSIS — M25552 Pain in left hip: Secondary | ICD-10-CM | POA: Diagnosis not present

## 2024-02-21 DIAGNOSIS — M6281 Muscle weakness (generalized): Secondary | ICD-10-CM | POA: Diagnosis not present

## 2024-02-21 DIAGNOSIS — M25652 Stiffness of left hip, not elsewhere classified: Secondary | ICD-10-CM | POA: Diagnosis not present

## 2024-02-21 DIAGNOSIS — M25552 Pain in left hip: Secondary | ICD-10-CM | POA: Diagnosis not present

## 2024-02-21 DIAGNOSIS — M25662 Stiffness of left knee, not elsewhere classified: Secondary | ICD-10-CM | POA: Diagnosis not present

## 2024-02-22 ENCOUNTER — Ambulatory Visit
Admission: RE | Admit: 2024-02-22 | Discharge: 2024-02-22 | Disposition: A | Discharge status: Home / Self Care | Payer: Medicare Other | Source: Ambulatory Visit | Attending: Family Medicine | Admitting: Family Medicine

## 2024-02-22 DIAGNOSIS — M8580 Other specified disorders of bone density and structure, unspecified site: Secondary | ICD-10-CM

## 2024-02-22 DIAGNOSIS — M8588 Other specified disorders of bone density and structure, other site: Secondary | ICD-10-CM | POA: Diagnosis not present

## 2024-02-26 DIAGNOSIS — M25652 Stiffness of left hip, not elsewhere classified: Secondary | ICD-10-CM | POA: Diagnosis not present

## 2024-02-26 DIAGNOSIS — M25662 Stiffness of left knee, not elsewhere classified: Secondary | ICD-10-CM | POA: Diagnosis not present

## 2024-02-26 DIAGNOSIS — M25552 Pain in left hip: Secondary | ICD-10-CM | POA: Diagnosis not present

## 2024-02-26 DIAGNOSIS — M6281 Muscle weakness (generalized): Secondary | ICD-10-CM | POA: Diagnosis not present

## 2024-02-28 DIAGNOSIS — M25662 Stiffness of left knee, not elsewhere classified: Secondary | ICD-10-CM | POA: Diagnosis not present

## 2024-02-28 DIAGNOSIS — M25552 Pain in left hip: Secondary | ICD-10-CM | POA: Diagnosis not present

## 2024-02-28 DIAGNOSIS — M25652 Stiffness of left hip, not elsewhere classified: Secondary | ICD-10-CM | POA: Diagnosis not present

## 2024-02-28 DIAGNOSIS — M6281 Muscle weakness (generalized): Secondary | ICD-10-CM | POA: Diagnosis not present

## 2024-03-03 DIAGNOSIS — K922 Gastrointestinal hemorrhage, unspecified: Secondary | ICD-10-CM | POA: Diagnosis not present

## 2024-03-03 DIAGNOSIS — M25552 Pain in left hip: Secondary | ICD-10-CM | POA: Diagnosis not present

## 2024-03-03 DIAGNOSIS — S72142D Displaced intertrochanteric fracture of left femur, subsequent encounter for closed fracture with routine healing: Secondary | ICD-10-CM | POA: Diagnosis not present

## 2024-03-03 DIAGNOSIS — M25652 Stiffness of left hip, not elsewhere classified: Secondary | ICD-10-CM | POA: Diagnosis not present

## 2024-03-03 DIAGNOSIS — R600 Localized edema: Secondary | ICD-10-CM | POA: Diagnosis not present

## 2024-03-03 DIAGNOSIS — M6281 Muscle weakness (generalized): Secondary | ICD-10-CM | POA: Diagnosis not present

## 2024-03-03 DIAGNOSIS — M25662 Stiffness of left knee, not elsewhere classified: Secondary | ICD-10-CM | POA: Diagnosis not present

## 2024-03-06 DIAGNOSIS — M25662 Stiffness of left knee, not elsewhere classified: Secondary | ICD-10-CM | POA: Diagnosis not present

## 2024-03-06 DIAGNOSIS — M6281 Muscle weakness (generalized): Secondary | ICD-10-CM | POA: Diagnosis not present

## 2024-03-06 DIAGNOSIS — M25552 Pain in left hip: Secondary | ICD-10-CM | POA: Diagnosis not present

## 2024-03-06 DIAGNOSIS — M25652 Stiffness of left hip, not elsewhere classified: Secondary | ICD-10-CM | POA: Diagnosis not present

## 2024-03-10 DIAGNOSIS — M25552 Pain in left hip: Secondary | ICD-10-CM | POA: Diagnosis not present

## 2024-03-10 DIAGNOSIS — M25652 Stiffness of left hip, not elsewhere classified: Secondary | ICD-10-CM | POA: Diagnosis not present

## 2024-03-10 DIAGNOSIS — M25662 Stiffness of left knee, not elsewhere classified: Secondary | ICD-10-CM | POA: Diagnosis not present

## 2024-03-10 DIAGNOSIS — M6281 Muscle weakness (generalized): Secondary | ICD-10-CM | POA: Diagnosis not present

## 2024-03-13 DIAGNOSIS — H905 Unspecified sensorineural hearing loss: Secondary | ICD-10-CM | POA: Diagnosis not present

## 2024-03-19 DIAGNOSIS — M810 Age-related osteoporosis without current pathological fracture: Secondary | ICD-10-CM | POA: Diagnosis not present

## 2024-03-19 DIAGNOSIS — D500 Iron deficiency anemia secondary to blood loss (chronic): Secondary | ICD-10-CM | POA: Diagnosis not present

## 2024-03-19 DIAGNOSIS — E041 Nontoxic single thyroid nodule: Secondary | ICD-10-CM | POA: Diagnosis not present

## 2024-03-19 DIAGNOSIS — M25552 Pain in left hip: Secondary | ICD-10-CM | POA: Diagnosis not present

## 2024-03-19 DIAGNOSIS — Z8673 Personal history of transient ischemic attack (TIA), and cerebral infarction without residual deficits: Secondary | ICD-10-CM | POA: Diagnosis not present

## 2024-03-19 DIAGNOSIS — M25652 Stiffness of left hip, not elsewhere classified: Secondary | ICD-10-CM | POA: Diagnosis not present

## 2024-03-19 DIAGNOSIS — M6281 Muscle weakness (generalized): Secondary | ICD-10-CM | POA: Diagnosis not present

## 2024-03-19 DIAGNOSIS — Z8711 Personal history of peptic ulcer disease: Secondary | ICD-10-CM | POA: Diagnosis not present

## 2024-03-19 DIAGNOSIS — I129 Hypertensive chronic kidney disease with stage 1 through stage 4 chronic kidney disease, or unspecified chronic kidney disease: Secondary | ICD-10-CM | POA: Diagnosis not present

## 2024-03-19 DIAGNOSIS — M25662 Stiffness of left knee, not elsewhere classified: Secondary | ICD-10-CM | POA: Diagnosis not present

## 2024-03-19 DIAGNOSIS — E782 Mixed hyperlipidemia: Secondary | ICD-10-CM | POA: Diagnosis not present

## 2024-03-19 DIAGNOSIS — E1122 Type 2 diabetes mellitus with diabetic chronic kidney disease: Secondary | ICD-10-CM | POA: Diagnosis not present

## 2024-03-19 DIAGNOSIS — N1832 Chronic kidney disease, stage 3b: Secondary | ICD-10-CM | POA: Diagnosis not present

## 2024-03-25 DIAGNOSIS — M898X5 Other specified disorders of bone, thigh: Secondary | ICD-10-CM | POA: Diagnosis not present

## 2024-03-25 DIAGNOSIS — S72142D Displaced intertrochanteric fracture of left femur, subsequent encounter for closed fracture with routine healing: Secondary | ICD-10-CM | POA: Diagnosis not present

## 2024-03-26 DIAGNOSIS — M25662 Stiffness of left knee, not elsewhere classified: Secondary | ICD-10-CM | POA: Diagnosis not present

## 2024-03-26 DIAGNOSIS — M6281 Muscle weakness (generalized): Secondary | ICD-10-CM | POA: Diagnosis not present

## 2024-03-26 DIAGNOSIS — M25552 Pain in left hip: Secondary | ICD-10-CM | POA: Diagnosis not present

## 2024-03-26 DIAGNOSIS — M25652 Stiffness of left hip, not elsewhere classified: Secondary | ICD-10-CM | POA: Diagnosis not present

## 2024-03-31 DIAGNOSIS — M6281 Muscle weakness (generalized): Secondary | ICD-10-CM | POA: Diagnosis not present

## 2024-03-31 DIAGNOSIS — M25662 Stiffness of left knee, not elsewhere classified: Secondary | ICD-10-CM | POA: Diagnosis not present

## 2024-03-31 DIAGNOSIS — M25652 Stiffness of left hip, not elsewhere classified: Secondary | ICD-10-CM | POA: Diagnosis not present

## 2024-03-31 DIAGNOSIS — E1169 Type 2 diabetes mellitus with other specified complication: Secondary | ICD-10-CM | POA: Diagnosis not present

## 2024-03-31 DIAGNOSIS — N1832 Chronic kidney disease, stage 3b: Secondary | ICD-10-CM | POA: Diagnosis not present

## 2024-03-31 DIAGNOSIS — M25552 Pain in left hip: Secondary | ICD-10-CM | POA: Diagnosis not present

## 2024-03-31 DIAGNOSIS — E782 Mixed hyperlipidemia: Secondary | ICD-10-CM | POA: Diagnosis not present

## 2024-03-31 DIAGNOSIS — N183 Chronic kidney disease, stage 3 unspecified: Secondary | ICD-10-CM | POA: Diagnosis not present

## 2024-04-02 DIAGNOSIS — R600 Localized edema: Secondary | ICD-10-CM | POA: Diagnosis not present

## 2024-04-02 DIAGNOSIS — K922 Gastrointestinal hemorrhage, unspecified: Secondary | ICD-10-CM | POA: Diagnosis not present

## 2024-04-02 DIAGNOSIS — S72142D Displaced intertrochanteric fracture of left femur, subsequent encounter for closed fracture with routine healing: Secondary | ICD-10-CM | POA: Diagnosis not present

## 2024-04-07 DIAGNOSIS — M25652 Stiffness of left hip, not elsewhere classified: Secondary | ICD-10-CM | POA: Diagnosis not present

## 2024-04-07 DIAGNOSIS — M25662 Stiffness of left knee, not elsewhere classified: Secondary | ICD-10-CM | POA: Diagnosis not present

## 2024-04-07 DIAGNOSIS — M6281 Muscle weakness (generalized): Secondary | ICD-10-CM | POA: Diagnosis not present

## 2024-04-07 DIAGNOSIS — M25552 Pain in left hip: Secondary | ICD-10-CM | POA: Diagnosis not present

## 2024-04-09 ENCOUNTER — Other Ambulatory Visit (HOSPITAL_COMMUNITY): Payer: Self-pay

## 2024-04-09 MED ORDER — TRAMADOL HCL 50 MG PO TABS
50.0000 mg | ORAL_TABLET | Freq: Four times a day (QID) | ORAL | 0 refills | Status: AC | PRN
  Filled 2024-04-09: qty 20, 5d supply, fill #0

## 2024-04-14 DIAGNOSIS — M6281 Muscle weakness (generalized): Secondary | ICD-10-CM | POA: Diagnosis not present

## 2024-04-14 DIAGNOSIS — M25652 Stiffness of left hip, not elsewhere classified: Secondary | ICD-10-CM | POA: Diagnosis not present

## 2024-04-14 DIAGNOSIS — M25552 Pain in left hip: Secondary | ICD-10-CM | POA: Diagnosis not present

## 2024-04-14 DIAGNOSIS — M25662 Stiffness of left knee, not elsewhere classified: Secondary | ICD-10-CM | POA: Diagnosis not present

## 2024-04-17 DIAGNOSIS — E119 Type 2 diabetes mellitus without complications: Secondary | ICD-10-CM | POA: Diagnosis not present

## 2024-04-17 DIAGNOSIS — H5202 Hypermetropia, left eye: Secondary | ICD-10-CM | POA: Diagnosis not present

## 2024-04-17 DIAGNOSIS — H16223 Keratoconjunctivitis sicca, not specified as Sjogren's, bilateral: Secondary | ICD-10-CM | POA: Diagnosis not present

## 2024-04-17 DIAGNOSIS — M25662 Stiffness of left knee, not elsewhere classified: Secondary | ICD-10-CM | POA: Diagnosis not present

## 2024-04-17 DIAGNOSIS — M6281 Muscle weakness (generalized): Secondary | ICD-10-CM | POA: Diagnosis not present

## 2024-04-17 DIAGNOSIS — H02882 Meibomian gland dysfunction right lower eyelid: Secondary | ICD-10-CM | POA: Diagnosis not present

## 2024-04-17 DIAGNOSIS — M25552 Pain in left hip: Secondary | ICD-10-CM | POA: Diagnosis not present

## 2024-04-17 DIAGNOSIS — H02885 Meibomian gland dysfunction left lower eyelid: Secondary | ICD-10-CM | POA: Diagnosis not present

## 2024-04-17 DIAGNOSIS — H52222 Regular astigmatism, left eye: Secondary | ICD-10-CM | POA: Diagnosis not present

## 2024-04-17 DIAGNOSIS — M25652 Stiffness of left hip, not elsewhere classified: Secondary | ICD-10-CM | POA: Diagnosis not present

## 2024-05-01 DIAGNOSIS — N183 Chronic kidney disease, stage 3 unspecified: Secondary | ICD-10-CM | POA: Diagnosis not present

## 2024-05-01 DIAGNOSIS — N1832 Chronic kidney disease, stage 3b: Secondary | ICD-10-CM | POA: Diagnosis not present

## 2024-05-01 DIAGNOSIS — E782 Mixed hyperlipidemia: Secondary | ICD-10-CM | POA: Diagnosis not present

## 2024-05-01 DIAGNOSIS — E1169 Type 2 diabetes mellitus with other specified complication: Secondary | ICD-10-CM | POA: Diagnosis not present

## 2024-05-03 DIAGNOSIS — R600 Localized edema: Secondary | ICD-10-CM | POA: Diagnosis not present

## 2024-05-03 DIAGNOSIS — K922 Gastrointestinal hemorrhage, unspecified: Secondary | ICD-10-CM | POA: Diagnosis not present

## 2024-05-03 DIAGNOSIS — S72142D Displaced intertrochanteric fracture of left femur, subsequent encounter for closed fracture with routine healing: Secondary | ICD-10-CM | POA: Diagnosis not present

## 2024-05-06 DIAGNOSIS — M6281 Muscle weakness (generalized): Secondary | ICD-10-CM | POA: Diagnosis not present

## 2024-05-06 DIAGNOSIS — M25652 Stiffness of left hip, not elsewhere classified: Secondary | ICD-10-CM | POA: Diagnosis not present

## 2024-05-06 DIAGNOSIS — M25552 Pain in left hip: Secondary | ICD-10-CM | POA: Diagnosis not present

## 2024-05-06 DIAGNOSIS — M25662 Stiffness of left knee, not elsewhere classified: Secondary | ICD-10-CM | POA: Diagnosis not present

## 2024-05-13 DIAGNOSIS — M6281 Muscle weakness (generalized): Secondary | ICD-10-CM | POA: Diagnosis not present

## 2024-05-13 DIAGNOSIS — M25552 Pain in left hip: Secondary | ICD-10-CM | POA: Diagnosis not present

## 2024-05-13 DIAGNOSIS — M25652 Stiffness of left hip, not elsewhere classified: Secondary | ICD-10-CM | POA: Diagnosis not present

## 2024-05-13 DIAGNOSIS — M25662 Stiffness of left knee, not elsewhere classified: Secondary | ICD-10-CM | POA: Diagnosis not present

## 2024-05-20 DIAGNOSIS — M25552 Pain in left hip: Secondary | ICD-10-CM | POA: Diagnosis not present

## 2024-05-20 DIAGNOSIS — M6281 Muscle weakness (generalized): Secondary | ICD-10-CM | POA: Diagnosis not present

## 2024-05-20 DIAGNOSIS — M25652 Stiffness of left hip, not elsewhere classified: Secondary | ICD-10-CM | POA: Diagnosis not present

## 2024-05-20 DIAGNOSIS — M25662 Stiffness of left knee, not elsewhere classified: Secondary | ICD-10-CM | POA: Diagnosis not present

## 2024-06-01 DIAGNOSIS — N1832 Chronic kidney disease, stage 3b: Secondary | ICD-10-CM | POA: Diagnosis not present

## 2024-06-01 DIAGNOSIS — N183 Chronic kidney disease, stage 3 unspecified: Secondary | ICD-10-CM | POA: Diagnosis not present

## 2024-06-01 DIAGNOSIS — E782 Mixed hyperlipidemia: Secondary | ICD-10-CM | POA: Diagnosis not present

## 2024-06-01 DIAGNOSIS — E1169 Type 2 diabetes mellitus with other specified complication: Secondary | ICD-10-CM | POA: Diagnosis not present

## 2024-06-03 DIAGNOSIS — S72142D Displaced intertrochanteric fracture of left femur, subsequent encounter for closed fracture with routine healing: Secondary | ICD-10-CM | POA: Diagnosis not present

## 2024-06-03 DIAGNOSIS — R600 Localized edema: Secondary | ICD-10-CM | POA: Diagnosis not present

## 2024-06-03 DIAGNOSIS — K922 Gastrointestinal hemorrhage, unspecified: Secondary | ICD-10-CM | POA: Diagnosis not present

## 2024-06-16 DIAGNOSIS — S72142D Displaced intertrochanteric fracture of left femur, subsequent encounter for closed fracture with routine healing: Secondary | ICD-10-CM | POA: Diagnosis not present

## 2024-06-19 DIAGNOSIS — Z8711 Personal history of peptic ulcer disease: Secondary | ICD-10-CM | POA: Diagnosis not present

## 2024-06-19 DIAGNOSIS — E1122 Type 2 diabetes mellitus with diabetic chronic kidney disease: Secondary | ICD-10-CM | POA: Diagnosis not present

## 2024-06-19 DIAGNOSIS — E782 Mixed hyperlipidemia: Secondary | ICD-10-CM | POA: Diagnosis not present

## 2024-06-19 DIAGNOSIS — N1832 Chronic kidney disease, stage 3b: Secondary | ICD-10-CM | POA: Diagnosis not present

## 2024-06-19 DIAGNOSIS — Z8673 Personal history of transient ischemic attack (TIA), and cerebral infarction without residual deficits: Secondary | ICD-10-CM | POA: Diagnosis not present

## 2024-06-19 DIAGNOSIS — M810 Age-related osteoporosis without current pathological fracture: Secondary | ICD-10-CM | POA: Diagnosis not present

## 2024-06-19 DIAGNOSIS — I129 Hypertensive chronic kidney disease with stage 1 through stage 4 chronic kidney disease, or unspecified chronic kidney disease: Secondary | ICD-10-CM | POA: Diagnosis not present

## 2024-06-19 DIAGNOSIS — Z23 Encounter for immunization: Secondary | ICD-10-CM | POA: Diagnosis not present

## 2024-06-19 DIAGNOSIS — D500 Iron deficiency anemia secondary to blood loss (chronic): Secondary | ICD-10-CM | POA: Diagnosis not present

## 2024-07-01 DIAGNOSIS — E1169 Type 2 diabetes mellitus with other specified complication: Secondary | ICD-10-CM | POA: Diagnosis not present

## 2024-07-01 DIAGNOSIS — N1832 Chronic kidney disease, stage 3b: Secondary | ICD-10-CM | POA: Diagnosis not present

## 2024-07-01 DIAGNOSIS — E782 Mixed hyperlipidemia: Secondary | ICD-10-CM | POA: Diagnosis not present

## 2024-07-01 DIAGNOSIS — N183 Chronic kidney disease, stage 3 unspecified: Secondary | ICD-10-CM | POA: Diagnosis not present

## 2024-07-03 DIAGNOSIS — K922 Gastrointestinal hemorrhage, unspecified: Secondary | ICD-10-CM | POA: Diagnosis not present

## 2024-07-03 DIAGNOSIS — R600 Localized edema: Secondary | ICD-10-CM | POA: Diagnosis not present

## 2024-07-03 DIAGNOSIS — S72142D Displaced intertrochanteric fracture of left femur, subsequent encounter for closed fracture with routine healing: Secondary | ICD-10-CM | POA: Diagnosis not present

## 2024-08-18 ENCOUNTER — Other Ambulatory Visit: Payer: Self-pay | Admitting: Family Medicine

## 2024-08-18 DIAGNOSIS — Z1231 Encounter for screening mammogram for malignant neoplasm of breast: Secondary | ICD-10-CM

## 2024-09-18 ENCOUNTER — Inpatient Hospital Stay: Admission: RE | Admit: 2024-09-18 | Discharge: 2024-09-18 | Attending: Family Medicine | Admitting: Family Medicine

## 2024-09-18 DIAGNOSIS — Z1231 Encounter for screening mammogram for malignant neoplasm of breast: Secondary | ICD-10-CM
